# Patient Record
Sex: Female | Born: 1950 | Race: White | Hispanic: No | Marital: Married | Smoking: Never smoker
Health system: Southern US, Community
[De-identification: ages and names within clinical notes are randomized; demographics above are authoritative.]

## PROBLEM LIST (undated history)

## (undated) DIAGNOSIS — C801 Malignant (primary) neoplasm, unspecified: Secondary | ICD-10-CM

## (undated) DIAGNOSIS — I10 Essential (primary) hypertension: Secondary | ICD-10-CM

## (undated) DIAGNOSIS — Z923 Personal history of irradiation: Secondary | ICD-10-CM

## (undated) DIAGNOSIS — Z800 Family history of malignant neoplasm of digestive organs: Secondary | ICD-10-CM

## (undated) DIAGNOSIS — E119 Type 2 diabetes mellitus without complications: Secondary | ICD-10-CM

## (undated) DIAGNOSIS — Z8489 Family history of other specified conditions: Secondary | ICD-10-CM

## (undated) DIAGNOSIS — K219 Gastro-esophageal reflux disease without esophagitis: Secondary | ICD-10-CM

## (undated) DIAGNOSIS — Z801 Family history of malignant neoplasm of trachea, bronchus and lung: Secondary | ICD-10-CM

## (undated) DIAGNOSIS — R519 Headache, unspecified: Secondary | ICD-10-CM

## (undated) DIAGNOSIS — G4730 Sleep apnea, unspecified: Secondary | ICD-10-CM

## (undated) DIAGNOSIS — Z803 Family history of malignant neoplasm of breast: Secondary | ICD-10-CM

## (undated) DIAGNOSIS — Z8049 Family history of malignant neoplasm of other genital organs: Secondary | ICD-10-CM

## (undated) DIAGNOSIS — R7303 Prediabetes: Secondary | ICD-10-CM

## (undated) DIAGNOSIS — L570 Actinic keratosis: Secondary | ICD-10-CM

## (undated) DIAGNOSIS — C50919 Malignant neoplasm of unspecified site of unspecified female breast: Secondary | ICD-10-CM

## (undated) DIAGNOSIS — M1990 Unspecified osteoarthritis, unspecified site: Secondary | ICD-10-CM

## (undated) HISTORY — PX: BREAST SURGERY: SHX581

## (undated) HISTORY — DX: Family history of malignant neoplasm of other genital organs: Z80.49

## (undated) HISTORY — PX: BREAST LUMPECTOMY: SHX2

## (undated) HISTORY — DX: Family history of malignant neoplasm of digestive organs: Z80.0

## (undated) HISTORY — PX: CHOLECYSTECTOMY: SHX55

## (undated) HISTORY — DX: Family history of malignant neoplasm of breast: Z80.3

## (undated) HISTORY — PX: BREAST CYST EXCISION: SHX579

## (undated) HISTORY — DX: Family history of malignant neoplasm of trachea, bronchus and lung: Z80.1

## (undated) HISTORY — DX: Actinic keratosis: L57.0

## (undated) HISTORY — PX: APPENDECTOMY: SHX54

---

## 2004-05-29 ENCOUNTER — Ambulatory Visit: Payer: Self-pay | Admitting: Obstetrics and Gynecology

## 2004-12-18 ENCOUNTER — Ambulatory Visit: Payer: Self-pay | Admitting: Obstetrics and Gynecology

## 2005-06-04 ENCOUNTER — Ambulatory Visit: Payer: Self-pay | Admitting: Obstetrics and Gynecology

## 2005-08-06 ENCOUNTER — Ambulatory Visit: Payer: Self-pay | Admitting: Obstetrics and Gynecology

## 2005-08-11 ENCOUNTER — Ambulatory Visit: Payer: Self-pay | Admitting: Gastroenterology

## 2006-02-25 ENCOUNTER — Inpatient Hospital Stay: Payer: Self-pay | Admitting: Surgery

## 2006-02-25 ENCOUNTER — Ambulatory Visit: Payer: Self-pay | Admitting: Gastroenterology

## 2006-10-28 ENCOUNTER — Ambulatory Visit: Payer: Self-pay | Admitting: Obstetrics and Gynecology

## 2007-11-10 ENCOUNTER — Ambulatory Visit: Payer: Self-pay | Admitting: Obstetrics and Gynecology

## 2008-11-14 ENCOUNTER — Ambulatory Visit: Payer: Self-pay | Admitting: Obstetrics and Gynecology

## 2009-04-27 ENCOUNTER — Inpatient Hospital Stay: Payer: Self-pay | Admitting: Internal Medicine

## 2010-03-19 ENCOUNTER — Ambulatory Visit: Payer: Self-pay | Admitting: Family Medicine

## 2011-03-24 ENCOUNTER — Ambulatory Visit: Payer: Self-pay | Admitting: Family Medicine

## 2012-11-10 ENCOUNTER — Ambulatory Visit: Payer: Self-pay | Admitting: Family Medicine

## 2013-08-07 DIAGNOSIS — M1990 Unspecified osteoarthritis, unspecified site: Secondary | ICD-10-CM | POA: Insufficient documentation

## 2014-02-21 DIAGNOSIS — G4730 Sleep apnea, unspecified: Secondary | ICD-10-CM | POA: Insufficient documentation

## 2016-04-06 ENCOUNTER — Ambulatory Visit
Admission: EM | Admit: 2016-04-06 | Discharge: 2016-04-06 | Disposition: A | Discharge status: Home / Self Care | Payer: Medicare Other | Attending: Family Medicine | Admitting: Family Medicine

## 2016-04-06 ENCOUNTER — Encounter: Payer: Self-pay | Admitting: Emergency Medicine

## 2016-04-06 DIAGNOSIS — J069 Acute upper respiratory infection, unspecified: Secondary | ICD-10-CM

## 2016-04-06 DIAGNOSIS — R05 Cough: Secondary | ICD-10-CM | POA: Diagnosis not present

## 2016-04-06 DIAGNOSIS — R059 Cough, unspecified: Secondary | ICD-10-CM

## 2016-04-06 HISTORY — DX: Type 2 diabetes mellitus without complications: E11.9

## 2016-04-06 HISTORY — DX: Essential (primary) hypertension: I10

## 2016-04-06 MED ORDER — ALBUTEROL SULFATE HFA 108 (90 BASE) MCG/ACT IN AERS: 2.0000 | INHALATION_SPRAY | RESPIRATORY_TRACT | 0 refills | Status: DC | PRN

## 2016-04-06 MED ORDER — HYDROCOD POLST-CPM POLST ER 10-8 MG/5ML PO SUER: 5.0000 mL | Freq: Every evening | ORAL | 0 refills | Status: DC | PRN

## 2016-04-06 MED ORDER — PREDNISONE 20 MG PO TABS
40.0000 mg | ORAL_TABLET | Freq: Every day | ORAL | 0 refills | Status: DC
Start: 2016-04-06 — End: 2019-10-30

## 2016-04-06 NOTE — ED Triage Notes (Signed)
Patient c/o cough and chest congestion for 2-3 days.  Patient denies fevers.

## 2016-04-06 NOTE — Discharge Instructions (Signed)
Take medication as prescribed. Rest. Drink plenty of fluids.  ° °Follow up with your primary care physician this week as needed. Return to Urgent care for new or worsening concerns.  ° °

## 2016-04-06 NOTE — ED Provider Notes (Signed)
[facility] ____________________________________________  Time seen: Approximately 4:06 PM  I have reviewed the triage vital signs and the nursing notes.   HISTORY  Chief Complaint Cough   HPI Barbara [name] is a 66 y.o. female  presenting for 3 days of cough and chest congestion. Reports occasional runny nose. Denies nasal congestion or sinus pressure. Reports cough is primarily a dry hacking cough. Reports cough is worse at night, which disrupts her sleep as she uses a CPAP at night. Reports cough during the day is not too bad. Denies accompanying fevers, chills or body aches. Reports has continued to remain active and work. 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 Reports that she has had the flu before, and reports that she does not feel like she has the flu. Denies home sick contacts.   Denies chest pain, pain with deep breath, shortness of breath, abdominal pain, dysuria, extremity pain, extremity swelling or rash. Denies recent sickness. Denies recent antibiotic use. Denies cardiac issues. Denies renal insufficiency.  [facility]: PCP   Past Medical History:  Diagnosis Date  . Diabetes mellitus without complication ([place])   . Hypertension     There are no active problems to display for this patient.   Past Surgical History:  Procedure Laterality Date  . APPENDECTOMY    . BREAST SURGERY    . CHOLECYSTECTOMY       No current facility-administered medications for this encounter.   Current Outpatient Prescriptions:  .  amLODipine (NORVASC) 5 MG tablet, Take 5 mg by mouth daily., Disp: , Rfl:  .  aspirin 81 MG chewable tablet, Chew 81 mg by mouth daily., Disp: , Rfl:  .  lovastatin (MEVACOR) 20 MG tablet, Take 20 mg by mouth at bedtime., Disp: , Rfl:  .  pantoprazole (PROTONIX) 40 MG tablet, Take 40 mg by mouth daily., Disp: , Rfl:  .  albuterol (PROVENTIL HFA;VENTOLIN HFA) 108 (90 Base) MCG/ACT inhaler, Inhale 2  puffs into the lungs every 4 (four) hours as needed for wheezing., Disp: 1 Inhaler, Rfl: 0 .  chlorpheniramine-HYDROcodone (TUSSIONEX PENNKINETIC ER) 10-8 MG/5ML SUER, Take 5 mLs by mouth at bedtime as needed for cough. do not drive or operate machinery while taking as can cause drowsiness., Disp: 75 mL, Rfl: 0 .  predniSONE (DELTASONE) 20 MG tablet, Take 2 tablets (40 mg total) by mouth daily., Disp: 6 tablet, Rfl: 0  Allergies Patient has no known allergies.  History reviewed. No pertinent family history.  Social History Social History  Substance Use Topics  . Smoking status: Never Smoker  . Smokeless tobacco: Never Used  . Alcohol use No    Review of Systems Constitutional: No fever/chills.  Eyes: No visual changes. ENT: No sore throat. Cardiovascular: Denies chest pain. Respiratory: Denies shortness of breath. Gastrointestinal: No abdominal pain.  No nausea, no vomiting.  No diarrhea.  No constipation. Genitourinary: Negative for dysuria. Musculoskeletal: Negative for back pain. Skin: Negative for rash. Neurological: Negative for headaches, focal weakness or numbness.  10-point ROS otherwise negative.  ____________________________________________   PHYSICAL EXAM:  VITAL SIGNS: ED Triage Vitals  Enc Vitals Group     BP 04/06/16 1546 (!) 165/78     Pulse Rate 04/06/16 1546 85     Resp 04/06/16 1546 16     Temp 04/06/16 1546 97.9 F (36.6 C)     Temp Source 04/06/16 1546 Oral     SpO2 04/06/16 1546 99 %  Weight 04/06/16 1543 195 lb (88.5 kg)     Height 04/06/16 1543 5\' 5"  (1.651 m)     Head Circumference --      Peak Flow --      Pain Score 04/06/16 1546 0     Pain Loc --      Pain Edu? --      Excl. in [place]? --    Constitutional: Alert and oriented. Well appearing and in no acute distress. Eyes: Conjunctivae are normal. PERRL. EOMI. Head: Atraumatic. No sinus tenderness to palpation. No swelling. No erythema.  Ears: no erythema, normal TMs bilaterally.    Nose: No nasal congestion or rhinorrhea.   Mouth/Throat: Mucous membranes are moist. No pharyngeal erythema. No tonsillar swelling or exudate.  Neck: No stridor.  No cervical spine tenderness to palpation. Hematological/Lymphatic/Immunilogical: No cervical lymphadenopathy. Cardiovascular: Normal rate, regular rhythm. Grossly normal heart sounds.  Good peripheral circulation.  Respiratory: Normal respiratory effort.  No retractions. No wheezes, rales or rhonchi. Good air movement. Speaks in complete sentences. Dry intermittent cough in room with mild bronchospasm wheeze. Chest nontender to palpation.  Gastrointestinal: Soft and nontender.  Musculoskeletal: Ambulatory with steady gait. No cervical, thoracic or lumbar tenderness to palpation. Bilateral lower extremities nontender no edema noted. Bilateral pedal pulses equal and easily palpated. Neurologic:  Normal speech and language. No gait instability. Skin:  Skin appears warm, dry and intact. No rash noted. Psychiatric: Mood and affect are normal. Speech and behavior are normal.  ___________________________________________   LABS (all labs ordered are listed, but only abnormal results are displayed)  Labs Reviewed - No data to display  PROCEDURES Procedures   INITIAL IMPRESSION / ASSESSMENT AND PLAN / ED COURSE  Pertinent labs & imaging results that were available during my care of the patient were reviewed by me and considered in my medical decision making (see chart for details).   Well-appearing patient. No acute distress. Suspect viral upper respiratory infection. Will treat patient with Tussionex at night as needed, 3 day course prednisone and when necessary albuterol inhaler. Encourage rest, fluids and supportive care. Discussed indication, risks and benefits of medications with patient.   Discussed follow up with Primary care physician this week. Discussed follow up and return parameters including no resolution or any worsening  concerns. Patient verbalized understanding and agreed to plan.   ____________________________________________   FINAL CLINICAL IMPRESSION(S) / ED DIAGNOSES  Final diagnoses:  Cough  Upper respiratory tract infection, unspecified type     Discharge Medication List as of 04/06/2016  4:25 PM    START taking these medications   Details  albuterol (PROVENTIL HFA;VENTOLIN HFA) 108 (90 Base) MCG/ACT inhaler Inhale 2 puffs into the lungs every 4 (four) hours as needed for wheezing., Starting Mon 04/06/2016, Normal    chlorpheniramine-HYDROcodone (TUSSIONEX PENNKINETIC ER) 10-8 MG/5ML SUER Take 5 mLs by mouth at bedtime as needed for cough. do not drive or operate machinery while taking as can cause drowsiness., Starting Mon 04/06/2016, Print    predniSONE (DELTASONE) 20 MG tablet Take 2 tablets (40 mg total) by mouth daily., Starting Mon 04/06/2016, Normal        Note: This dictation was prepared with Dragon dictation along with smaller phrase technology. Any transcriptional errors that result from this process are unintentional.         Marylene Land, NP 04/06/16 1654

## 2016-04-09 ENCOUNTER — Telehealth: Payer: Self-pay

## 2016-04-09 NOTE — Telephone Encounter (Signed)
Courtesy call back completed today after patient's visit at [facility]. Patient improved and will call back with any questions or concerns.  

## 2017-08-30 DIAGNOSIS — E669 Obesity, unspecified: Secondary | ICD-10-CM | POA: Insufficient documentation

## 2017-09-07 ENCOUNTER — Other Ambulatory Visit: Payer: Self-pay | Admitting: Family Medicine

## 2017-09-07 DIAGNOSIS — Z1231 Encounter for screening mammogram for malignant neoplasm of breast: Secondary | ICD-10-CM

## 2017-10-11 ENCOUNTER — Ambulatory Visit
Admission: RE | Admit: 2017-10-11 | Discharge: 2017-10-11 | Disposition: A | Discharge status: Home / Self Care | Payer: Medicare Other | Source: Ambulatory Visit | Attending: Family Medicine | Admitting: Family Medicine

## 2017-10-11 DIAGNOSIS — Z1231 Encounter for screening mammogram for malignant neoplasm of breast: Secondary | ICD-10-CM | POA: Insufficient documentation

## 2018-09-21 ENCOUNTER — Other Ambulatory Visit: Payer: Self-pay | Admitting: Family Medicine

## 2018-09-21 DIAGNOSIS — Z1231 Encounter for screening mammogram for malignant neoplasm of breast: Secondary | ICD-10-CM

## 2018-09-27 ENCOUNTER — Other Ambulatory Visit: Payer: Self-pay | Admitting: Family Medicine

## 2018-09-27 DIAGNOSIS — R599 Enlarged lymph nodes, unspecified: Secondary | ICD-10-CM

## 2018-10-04 ENCOUNTER — Ambulatory Visit
Admission: RE | Admit: 2018-10-04 | Discharge: 2018-10-04 | Disposition: A | Discharge status: Home / Self Care | Payer: Medicare Other | Source: Ambulatory Visit | Attending: Family Medicine | Admitting: Family Medicine

## 2018-10-04 DIAGNOSIS — R599 Enlarged lymph nodes, unspecified: Secondary | ICD-10-CM | POA: Diagnosis not present

## 2018-10-07 ENCOUNTER — Other Ambulatory Visit: Payer: Self-pay | Admitting: Family Medicine

## 2018-10-07 DIAGNOSIS — N6320 Unspecified lump in the left breast, unspecified quadrant: Secondary | ICD-10-CM

## 2019-04-05 ENCOUNTER — Ambulatory Visit
Admission: RE | Admit: 2019-04-05 | Discharge: 2019-04-05 | Disposition: A | Discharge status: Home / Self Care | Payer: Medicare PPO | Source: Ambulatory Visit | Attending: Family Medicine | Admitting: Family Medicine

## 2019-04-05 DIAGNOSIS — N6320 Unspecified lump in the left breast, unspecified quadrant: Secondary | ICD-10-CM

## 2019-04-06 ENCOUNTER — Other Ambulatory Visit: Payer: Self-pay | Admitting: Family Medicine

## 2019-04-06 DIAGNOSIS — R2232 Localized swelling, mass and lump, left upper limb: Secondary | ICD-10-CM

## 2019-04-06 DIAGNOSIS — R928 Other abnormal and inconclusive findings on diagnostic imaging of breast: Secondary | ICD-10-CM

## 2019-06-16 ENCOUNTER — Other Ambulatory Visit: Payer: Self-pay | Admitting: Orthopedic Surgery

## 2019-06-16 DIAGNOSIS — M25361 Other instability, right knee: Secondary | ICD-10-CM

## 2019-06-16 DIAGNOSIS — M1711 Unilateral primary osteoarthritis, right knee: Secondary | ICD-10-CM

## 2019-06-16 DIAGNOSIS — M25561 Pain in right knee: Secondary | ICD-10-CM

## 2019-06-16 DIAGNOSIS — M2391 Unspecified internal derangement of right knee: Secondary | ICD-10-CM

## 2019-06-29 ENCOUNTER — Other Ambulatory Visit: Payer: Self-pay

## 2019-06-29 ENCOUNTER — Ambulatory Visit
Admission: RE | Admit: 2019-06-29 | Discharge: 2019-06-29 | Disposition: A | Discharge status: Home / Self Care | Payer: Medicare PPO | Source: Ambulatory Visit | Attending: Orthopedic Surgery | Admitting: Orthopedic Surgery

## 2019-06-29 DIAGNOSIS — M25561 Pain in right knee: Secondary | ICD-10-CM | POA: Diagnosis present

## 2019-06-29 DIAGNOSIS — G8929 Other chronic pain: Secondary | ICD-10-CM | POA: Diagnosis present

## 2019-06-29 DIAGNOSIS — M25361 Other instability, right knee: Secondary | ICD-10-CM | POA: Diagnosis present

## 2019-06-29 DIAGNOSIS — M2391 Unspecified internal derangement of right knee: Secondary | ICD-10-CM

## 2019-06-29 DIAGNOSIS — M1711 Unilateral primary osteoarthritis, right knee: Secondary | ICD-10-CM | POA: Diagnosis present

## 2019-10-13 ENCOUNTER — Ambulatory Visit
Admission: RE | Admit: 2019-10-13 | Discharge: 2019-10-13 | Disposition: A | Discharge status: Home / Self Care | Payer: Medicare PPO | Source: Ambulatory Visit | Attending: Family Medicine | Admitting: Family Medicine

## 2019-10-13 ENCOUNTER — Other Ambulatory Visit: Payer: Self-pay | Admitting: Family Medicine

## 2019-10-13 DIAGNOSIS — R2232 Localized swelling, mass and lump, left upper limb: Secondary | ICD-10-CM

## 2019-10-13 DIAGNOSIS — R928 Other abnormal and inconclusive findings on diagnostic imaging of breast: Secondary | ICD-10-CM

## 2019-10-19 ENCOUNTER — Other Ambulatory Visit: Payer: Self-pay | Admitting: Family Medicine

## 2019-10-19 DIAGNOSIS — N6310 Unspecified lump in the right breast, unspecified quadrant: Secondary | ICD-10-CM

## 2019-10-19 DIAGNOSIS — R928 Other abnormal and inconclusive findings on diagnostic imaging of breast: Secondary | ICD-10-CM

## 2019-10-20 ENCOUNTER — Other Ambulatory Visit: Payer: Self-pay

## 2019-10-20 ENCOUNTER — Ambulatory Visit
Admission: RE | Admit: 2019-10-20 | Discharge: 2019-10-20 | Disposition: A | Discharge status: Home / Self Care | Payer: Medicare PPO | Source: Ambulatory Visit | Attending: Family Medicine | Admitting: Family Medicine

## 2019-10-20 DIAGNOSIS — R928 Other abnormal and inconclusive findings on diagnostic imaging of breast: Secondary | ICD-10-CM

## 2019-10-20 DIAGNOSIS — N6310 Unspecified lump in the right breast, unspecified quadrant: Secondary | ICD-10-CM

## 2019-10-20 HISTORY — PX: BREAST BIOPSY: SHX20

## 2019-10-23 ENCOUNTER — Encounter: Payer: Self-pay | Admitting: *Deleted

## 2019-10-23 DIAGNOSIS — C50911 Malignant neoplasm of unspecified site of right female breast: Secondary | ICD-10-CM

## 2019-10-23 NOTE — Progress Notes (Signed)
Called patient to establish navigation services.  Patient is newly diagnosed with invasive mammary carcinoma.  I have scheduled her to see Dr. Peyton Najjar tomorrow at 1:30 for surgical consultation, and Dr. Tasia Catchings on 10/26/19 at 1:30 for medical oncology consultation.  Will give educational material at that time.

## 2019-10-24 ENCOUNTER — Other Ambulatory Visit: Payer: Self-pay | Admitting: General Surgery

## 2019-10-24 DIAGNOSIS — C50919 Malignant neoplasm of unspecified site of unspecified female breast: Secondary | ICD-10-CM

## 2019-10-25 ENCOUNTER — Other Ambulatory Visit: Payer: Self-pay | Admitting: General Surgery

## 2019-10-25 ENCOUNTER — Ambulatory Visit: Payer: Self-pay | Admitting: General Surgery

## 2019-10-25 DIAGNOSIS — C50919 Malignant neoplasm of unspecified site of unspecified female breast: Secondary | ICD-10-CM

## 2019-10-25 NOTE — H&P (Signed)
PATIENT PROFILE: Barbara [name] is a 69 y.o. female who presents to the Clinic for consultation at the request of Dr. Ellison Hughs for evaluation of breast cancer.  PCP:  Loa Socks., MD  HISTORY OF PRESENT ILLNESS: Barbara [name] reports having a diagnostic mammogram due to follow-up of a cyst of the left axillary area.  She was found with an area of distortion of the right breast.  Stereotactic core biopsy was done.  This shows invasive ductal carcinoma.  The mammographic and ultrasound findings shows area of distortion of 5 mm.  The invasive component measured 2 mm on the core biopsy.  I personally evaluated the images.  Patient denies any skin changes, palpable masses, nipple retraction, nipple lesions on either breast.  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   Elevated fasting blood sugar Unknown   Hypertension, essential, benign Unknown   Hyperlipidemia Unknown   GERD (gastroesophageal reflux disease) Unknown      GENERAL REVIEW OF SYSTEMS:   General ROS: negative for - chills, fatigue, fever, weight gain or weight loss Allergy and Immunology ROS: negative for - hives  Hematological and Lymphatic ROS: negative for - bleeding problems or bruising, negative for palpable nodes Endocrine ROS: negative for - heat or cold intolerance, hair changes Respiratory ROS: negative for - cough, shortness of breath or wheezing Cardiovascular ROS: no  chest pain or palpitations GI ROS: negative for nausea, vomiting, abdominal pain, diarrhea, constipation Musculoskeletal ROS: negative for - joint swelling or muscle pain Neurological ROS: negative for - confusion, syncope Dermatological ROS: negative for pruritus and rash Psychiatric: negative for anxiety, depression, difficulty sleeping and memory loss  MEDICATIONS: Current Medications        Current Outpatient Medications  Medication Sig Dispense Refill  . amLODIPine (NORVASC) 5 MG tablet Take 1 tablet by mouth once daily 90 tablet 1  . aspirin 81 MG EC tablet Take 81 mg by mouth once daily.    Marland Kitchen azelastine (ASTELIN) 137 mcg nasal spray Place 1 spray into both nostrils 2 (two) times daily 10 mL 1  . cetirizine (ZYRTEC) 10 MG tablet Take 10 mg by mouth once daily    . cholecalciferol (VITAMIN D3) 2,000 unit tablet Take 2,000 Units by mouth once daily.    . cinnamon bark (CINNAMON ORAL) Take 2,000 mg by mouth.    . diphenhydrAMINE (BENADRYL) 25 mg capsule Take 25 mg by mouth every 6 (six) hours as needed for Itching.    . lovastatin (MEVACOR) 40 MG tablet Take 1 tablet by mouth once daily 90 tablet 1  . meloxicam (MOBIC) 15 MG tablet Take 1 tablet (15 mg total) by mouth once daily 30 tablet 0  . mometasone (ELOCON) 0.1 % lotion Apply topically once daily    . omega-3 fatty acids (FISH OIL) 500 mg Cap capsule Take by mouth.    . pantoprazole (PROTONIX) 40 MG DR tablet Take 1 tablet by  mouth once daily 90 tablet 3   No current facility-administered medications for this visit.      ALLERGIES: Patient has no known allergies.  PAST MEDICAL HISTORY:     Past Medical History:  Diagnosis Date  . Diverticulosis   . Elevated fasting blood sugar   . GERD (gastroesophageal reflux disease)   . Hyperlipidemia   . Hypertension   . Osteoporosis   . Sleep apnea     PAST SURGICAL HISTORY:      Past Surgical History:  Procedure Laterality Date  .  APPENDECTOMY    . Benign breast biopsy    . CHOLECYSTECTOMY    . COLONOSCOPY  02/15/2003   Dr. Oval Linsey @ [facility] - Diverticulosis, [facility](m)  . COLONOSCOPY  12/09/2017   [facility] (Mother) [facility] 12/2022  . EGD  08/11/2005   Dr. Oval Linsey @ [facility] - Schatzki's ring     FAMILY HISTORY:      Family History  Problem Relation Age of Onset  . Colon cancer Mother   . Pancreatic cancer Father      SOCIAL HISTORY: Social History          Socioeconomic History  . Marital status: Married    Spouse name: Not on file  . Number of children: Not on file  . Years of education: Not on file  . Highest education level: Not on file  Occupational History  . Not on file  Tobacco Use  . Smoking status: Never Smoker  . Smokeless tobacco: Never Used  Substance and Sexual Activity  . Alcohol use: No  . Drug use: No  . Sexual activity: Defer  Other Topics Concern  . Not on file  Social History Narrative  . Not on file   Social Determinants of Health      Financial Resource Strain:   . Difficulty of Paying Living Expenses:   Food Insecurity:   . Worried About Programme researcher, broadcasting/film/video in the Last Year:   . Barista in the Last Year:   Transportation Needs:   . Freight forwarder (Medical):   Marland Kitchen Lack of Transportation (Non-Medical):       PHYSICAL EXAM:    Vitals:   10/24/19 1344  BP: 141/89  Pulse: 99   Body mass index is 39.06 kg/m. Weight: 90.7 kg (200 lb)   GENERAL: Alert, active, oriented x3  HEENT: Pupils equal reactive to light. Extraocular movements are intact. Sclera clear. Palpebral conjunctiva normal red color.Pharynx clear.  NECK: Supple with no palpable mass and no adenopathy.  LUNGS: Sound clear with no rales rhonchi or wheezes.  HEART: Regular rhythm S1 and S2 without murmur.  BREAST: breasts appear normal, no suspicious masses, no skin or nipple changes or axillary nodes.  ABDOMEN: Soft and depressible, nontender with no  palpable mass, no hepatomegaly.  EXTREMITIES: Well-developed well-nourished symmetrical with no dependent edema.  NEUROLOGICAL: Awake alert oriented, facial expression symmetrical, moving all extremities.  REVIEW OF DATA: I have reviewed the following data today:      No visits with results within 3 Month(s) from this visit.  Latest known visit with results is:  Office Visit on 03/28/2019  Component Date Value  . Glucose 03/28/2019 119*  . Sodium 03/28/2019 141   . Potassium 03/28/2019 4.0   . Chloride 03/28/2019 106   . Carbon Dioxide (CO2) 03/28/2019 29.9   . Urea Nitrogen (BUN) 03/28/2019 14   . Creatinine 03/28/2019 0.8   . Glomerular Filtration Ra* 03/28/2019  71   . Calcium 03/28/2019 9.5   . AST  03/28/2019 14   . ALT  03/28/2019 19   . Alk Phos (alkaline Phosp* 03/28/2019 96   . Albumin 03/28/2019 4.2   . Bilirubin, Total 03/28/2019 1.1   . Protein, Total 03/28/2019 6.6   . A/G Ratio 03/28/2019 1.8   . Cholesterol, Total 03/28/2019 192   . Triglyceride 03/28/2019 88   . HDL (High Density Lipopr* 03/28/2019 72.1   . LDL Calculated 03/28/2019 102   . VLDL Cholesterol 03/28/2019 18   . Cholesterol/HDL Ratio 03/28/2019 2.7   . Hemoglobin A1C 03/28/2019 6.2*  . Average Blood Glucose (C* 03/28/2019 131      ASSESSMENT: Ms. Mulka is a 69 y.o. female presenting for consultation for right breast cancer.    Patient was oriented again about the pathology results. Surgical alternatives were discussed with patient including partial vs total mastectomy. Surgical technique and post operative care was discussed with patient. Risk of surgery was discussed with patient including but not limited to: wound infection, seroma, hematoma, brachial plexopathy, mondor's disease (thrombosis of small veins of breast), chronic wound pain, breast lymphedema, altered sensation to the nipple and cosmesis among others.   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 Right breast radiofrequency guided partial mastectomy with sentinel lymph node biopsy (19301, 38525) 2. CBC, CMP 3. Hold aspirin 5 days before surgery 4. Contact us if you have any question or concern.  Patient verbalized understanding, all questions were answered, and were agreeable with the plan outlined above.   Herbert Pun, MD  Electronically signed by Herbert Pun, MD

## 2019-10-25 NOTE — H&P (View-Only) (Signed)
PATIENT PROFILE: Barbara [name] is a 69 y.o. female who presents to the Clinic for consultation at the request of Dr. Ellison Hughs for evaluation of breast cancer.  PCP:  Loa Socks., MD  HISTORY OF PRESENT ILLNESS: Barbara [name] reports having a diagnostic mammogram due to follow-up of a cyst of the left axillary area.  She was found with an area of distortion of the right breast.  Stereotactic core biopsy was done.  This shows invasive ductal carcinoma.  The mammographic and ultrasound findings shows area of distortion of 5 mm.  The invasive component measured 2 mm on the core biopsy.  I personally evaluated the images.  Patient denies any skin changes, palpable masses, nipple retraction, nipple lesions on either breast.  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   Elevated fasting blood sugar Unknown   Hypertension, essential, benign Unknown   Hyperlipidemia Unknown   GERD (gastroesophageal reflux disease) Unknown      GENERAL REVIEW OF SYSTEMS:   General ROS: negative for - chills, fatigue, fever, weight gain or weight loss Allergy and Immunology ROS: negative for - hives  Hematological and Lymphatic ROS: negative for - bleeding problems or bruising, negative for palpable nodes Endocrine ROS: negative for - heat or cold intolerance, hair changes Respiratory ROS: negative for - cough, shortness of breath or wheezing Cardiovascular ROS: no  chest pain or palpitations GI ROS: negative for nausea, vomiting, abdominal pain, diarrhea, constipation Musculoskeletal ROS: negative for - joint swelling or muscle pain Neurological ROS: negative for - confusion, syncope Dermatological ROS: negative for pruritus and rash Psychiatric: negative for anxiety, depression, difficulty sleeping and memory loss  MEDICATIONS: Current Medications        Current Outpatient Medications  Medication Sig Dispense Refill  . amLODIPine (NORVASC) 5 MG tablet Take 1 tablet by mouth once daily 90 tablet 1  . aspirin 81 MG EC tablet Take 81 mg by mouth once daily.    Marland Kitchen azelastine (ASTELIN) 137 mcg nasal spray Place 1 spray into both nostrils 2 (two) times daily 10 mL 1  . cetirizine (ZYRTEC) 10 MG tablet Take 10 mg by mouth once daily    . cholecalciferol (VITAMIN D3) 2,000 unit tablet Take 2,000 Units by mouth once daily.    . cinnamon bark (CINNAMON ORAL) Take 2,000 mg by mouth.    . diphenhydrAMINE (BENADRYL) 25 mg capsule Take 25 mg by mouth every 6 (six) hours as needed for Itching.    . lovastatin (MEVACOR) 40 MG tablet Take 1 tablet by mouth once daily 90 tablet 1  . meloxicam (MOBIC) 15 MG tablet Take 1 tablet (15 mg total) by mouth once daily 30 tablet 0  . mometasone (ELOCON) 0.1 % lotion Apply topically once daily    . omega-3 fatty acids (FISH OIL) 500 mg Cap capsule Take by mouth.    . pantoprazole (PROTONIX) 40 MG DR tablet Take 1 tablet by  mouth once daily 90 tablet 3   No current facility-administered medications for this visit.      ALLERGIES: Patient has no known allergies.  PAST MEDICAL HISTORY:     Past Medical History:  Diagnosis Date  . Diverticulosis   . Elevated fasting blood sugar   . GERD (gastroesophageal reflux disease)   . Hyperlipidemia   . Hypertension   . Osteoporosis   . Sleep apnea     PAST SURGICAL HISTORY:      Past Surgical History:  Procedure Laterality Date  .  APPENDECTOMY    . Benign breast biopsy    . CHOLECYSTECTOMY    . COLONOSCOPY  02/15/2003   Dr. Ivor Messier @ [facility] - Diverticulosis, [facility](m)  . COLONOSCOPY  12/09/2017   [facility] (Mother) CBF 12/2022  . EGD  08/11/2005   Dr. Ivor Messier @ [facility] - Schatzki's ring     FAMILY HISTORY:      Family History  Problem Relation Age of Onset  . Colon cancer Mother   . Pancreatic cancer Father      SOCIAL HISTORY: Social History          Socioeconomic History  . Marital status: Married    Spouse name: Not on file  . Number of children: Not on file  . Years of education: Not on file  . Highest education level: Not on file  Occupational History  . Not on file  Tobacco Use  . Smoking status: Never Smoker  . Smokeless tobacco: Never Used  Substance and Sexual Activity  . Alcohol use: No  . Drug use: No  . Sexual activity: Defer  Other Topics Concern  . Not on file  Social History Narrative  . Not on file   Social Determinants of Health      Financial Resource Strain:   . Difficulty of Paying Living Expenses:   Food Insecurity:   . Worried About Charity fundraiser in the Last Year:   . Arboriculturist in the Last Year:   Transportation Needs:   . Film/video editor (Medical):   Marland Kitchen Lack of Transportation (Non-Medical):       PHYSICAL EXAM:    Vitals:   10/24/19 1344  BP: 141/89  Pulse: 99   Body mass index is 39.06 kg/m. Weight: 90.7 kg (200 lb)   GENERAL: Alert, active, oriented x3  HEENT: Pupils equal reactive to light. Extraocular movements are intact. Sclera clear. Palpebral conjunctiva normal red color.Pharynx clear.  NECK: Supple with no palpable mass and no adenopathy.  LUNGS: Sound clear with no rales rhonchi or wheezes.  HEART: Regular rhythm S1 and S2 without murmur.  BREAST: breasts appear normal, no suspicious masses, no skin or nipple changes or axillary nodes.  ABDOMEN: Soft and depressible, nontender with no  palpable mass, no hepatomegaly.  EXTREMITIES: Well-developed well-nourished symmetrical with no dependent edema.  NEUROLOGICAL: Awake alert oriented, facial expression symmetrical, moving all extremities.  REVIEW OF DATA: I have reviewed the following data today:      No visits with results within 3 Month(s) from this visit.  Latest known visit with results is:  Office Visit on 03/28/2019  Component Date Value  . Glucose 03/28/2019 119*  . Sodium 03/28/2019 141   . Potassium 03/28/2019 4.0   . Chloride 03/28/2019 106   . Carbon Dioxide (CO2) 03/28/2019 29.9   . Urea Nitrogen (BUN) 03/28/2019 14   . Creatinine 03/28/2019 0.8   . Glomerular Filtration Ra* 03/28/2019  71   . Calcium 03/28/2019 9.5   . AST  03/28/2019 14   . ALT  03/28/2019 19   . Alk Phos (alkaline Phosp* 03/28/2019 96   . Albumin 03/28/2019 4.2   . Bilirubin, Total 03/28/2019 1.1   . Protein, Total 03/28/2019 6.6   . A/G Ratio 03/28/2019 1.8   . Cholesterol, Total 03/28/2019 192   . Triglyceride 03/28/2019 88   . HDL (High Density Lipopr* 03/28/2019 72.1   . LDL Calculated 03/28/2019 102   . VLDL Cholesterol 03/28/2019 18   . Cholesterol/HDL Ratio 03/28/2019 2.7   . Hemoglobin A1C 03/28/2019 6.2*  . Average Blood Glucose (C* 03/28/2019 131      ASSESSMENT: Ms. Mulka is a 69 y.o. female presenting for consultation for right breast cancer.    Patient was oriented again about the pathology results. Surgical alternatives were discussed with patient including partial vs total mastectomy. Surgical technique and post operative care was discussed with patient. Risk of surgery was discussed with patient including but not limited to: wound infection, seroma, hematoma, brachial plexopathy, mondor's disease (thrombosis of small veins of breast), chronic wound pain, breast lymphedema, altered sensation to the nipple and cosmesis among others.   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 Right breast radiofrequency guided partial mastectomy with sentinel lymph node biopsy (19301, [id number]) 2. CBC, CMP 3. Hold aspirin 5 days before surgery 4. Contact us if you have any question or concern.  Patient verbalized understanding, all questions were answered, and were agreeable with the plan outlined above.   Herbert Pun, MD  Electronically signed by Herbert Pun, MD

## 2019-10-26 ENCOUNTER — Encounter
Admission: RE | Admit: 2019-10-26 | Discharge: 2019-10-26 | Disposition: A | Discharge status: Home / Self Care | Payer: Medicare PPO | Source: Ambulatory Visit | Attending: General Surgery | Admitting: General Surgery

## 2019-10-26 ENCOUNTER — Inpatient Hospital Stay: Payer: Medicare PPO

## 2019-10-26 ENCOUNTER — Other Ambulatory Visit: Payer: Self-pay

## 2019-10-26 ENCOUNTER — Encounter: Payer: Self-pay | Admitting: Oncology

## 2019-10-26 ENCOUNTER — Ambulatory Visit
Admission: RE | Admit: 2019-10-26 | Discharge: 2019-10-26 | Disposition: A | Discharge status: Home / Self Care | Payer: Medicare PPO | Source: Ambulatory Visit | Attending: General Surgery | Admitting: General Surgery

## 2019-10-26 ENCOUNTER — Encounter: Payer: Self-pay | Admitting: *Deleted

## 2019-10-26 ENCOUNTER — Inpatient Hospital Stay: Payer: Medicare PPO | Attending: Oncology | Admitting: Oncology

## 2019-10-26 VITALS — BP 116/74 | HR 85 | Temp 97.8°F | Resp 18 | Ht 65.0 in | Wt 204.0 lb

## 2019-10-26 DIAGNOSIS — Z801 Family history of malignant neoplasm of trachea, bronchus and lung: Secondary | ICD-10-CM | POA: Insufficient documentation

## 2019-10-26 DIAGNOSIS — Z79899 Other long term (current) drug therapy: Secondary | ICD-10-CM | POA: Diagnosis not present

## 2019-10-26 DIAGNOSIS — Z800 Family history of malignant neoplasm of digestive organs: Secondary | ICD-10-CM

## 2019-10-26 DIAGNOSIS — Z803 Family history of malignant neoplasm of breast: Secondary | ICD-10-CM | POA: Diagnosis not present

## 2019-10-26 DIAGNOSIS — Z170 Estrogen receptor positive status [ER+]: Secondary | ICD-10-CM | POA: Insufficient documentation

## 2019-10-26 DIAGNOSIS — N6489 Other specified disorders of breast: Secondary | ICD-10-CM | POA: Insufficient documentation

## 2019-10-26 DIAGNOSIS — Z780 Asymptomatic menopausal state: Secondary | ICD-10-CM | POA: Diagnosis not present

## 2019-10-26 DIAGNOSIS — C50919 Malignant neoplasm of unspecified site of unspecified female breast: Secondary | ICD-10-CM | POA: Insufficient documentation

## 2019-10-26 DIAGNOSIS — R590 Localized enlarged lymph nodes: Secondary | ICD-10-CM | POA: Diagnosis not present

## 2019-10-26 DIAGNOSIS — Z9049 Acquired absence of other specified parts of digestive tract: Secondary | ICD-10-CM | POA: Diagnosis not present

## 2019-10-26 DIAGNOSIS — I2510 Atherosclerotic heart disease of native coronary artery without angina pectoris: Secondary | ICD-10-CM | POA: Insufficient documentation

## 2019-10-26 DIAGNOSIS — C50211 Malignant neoplasm of upper-inner quadrant of right female breast: Secondary | ICD-10-CM | POA: Diagnosis not present

## 2019-10-26 DIAGNOSIS — Z809 Family history of malignant neoplasm, unspecified: Secondary | ICD-10-CM

## 2019-10-26 DIAGNOSIS — C50911 Malignant neoplasm of unspecified site of right female breast: Secondary | ICD-10-CM

## 2019-10-26 HISTORY — DX: Sleep apnea, unspecified: G47.30

## 2019-10-26 HISTORY — DX: Prediabetes: R73.03

## 2019-10-26 HISTORY — DX: Malignant (primary) neoplasm, unspecified: C80.1

## 2019-10-26 HISTORY — DX: Gastro-esophageal reflux disease without esophagitis: K21.9

## 2019-10-26 HISTORY — DX: Headache, unspecified: R51.9

## 2019-10-26 LAB — CBC WITH DIFFERENTIAL/PLATELET
Abs Immature Granulocytes: 0.03 10*3/uL (ref 0.00–0.07)
Basophils Absolute: 0 10*3/uL (ref 0.0–0.1)
Basophils Relative: 1 %
Eosinophils Absolute: 0.1 10*3/uL (ref 0.0–0.5)
Eosinophils Relative: 1 %
HCT: 40.9 % (ref 36.0–46.0)
Hemoglobin: 14 g/dL (ref 12.0–15.0)
Immature Granulocytes: 0 %
Lymphocytes Relative: 18 %
Lymphs Abs: 1.5 10*3/uL (ref 0.7–4.0)
MCH: 27.7 pg (ref 26.0–34.0)
MCHC: 34.2 g/dL (ref 30.0–36.0)
MCV: 80.8 fL (ref 80.0–100.0)
Monocytes Absolute: 0.5 10*3/uL (ref 0.1–1.0)
Monocytes Relative: 6 %
Neutro Abs: 6.2 10*3/uL (ref 1.7–7.7)
Neutrophils Relative %: 74 %
Platelets: 200 10*3/uL (ref 150–400)
RBC: 5.06 MIL/uL (ref 3.87–5.11)
RDW: 14.1 % (ref 11.5–15.5)
WBC: 8.3 10*3/uL (ref 4.0–10.5)
nRBC: 0 % (ref 0.0–0.2)

## 2019-10-26 LAB — COMPREHENSIVE METABOLIC PANEL
ALT: 17 U/L (ref 0–44)
AST: 15 U/L (ref 15–41)
Albumin: 4.1 g/dL (ref 3.5–5.0)
Alkaline Phosphatase: 84 U/L (ref 38–126)
Anion gap: 9 (ref 5–15)
BUN: 15 mg/dL (ref 8–23)
CO2: 27 mmol/L (ref 22–32)
Calcium: 9 mg/dL (ref 8.9–10.3)
Chloride: 105 mmol/L (ref 98–111)
Creatinine, Ser: 1.08 mg/dL — ABNORMAL HIGH (ref 0.44–1.00)
GFR calc Af Amer: >60 mL/min (ref >60)
GFR calc non Af Amer: 52 mL/min — ABNORMAL LOW (ref >60)
Glucose, Bld: 125 mg/dL — ABNORMAL HIGH (ref 70–99)
Potassium: 3.6 mmol/L (ref 3.5–5.1)
Sodium: 141 mmol/L (ref 135–145)
Total Bilirubin: 1.7 mg/dL — ABNORMAL HIGH (ref 0.3–1.2)
Total Protein: 7.1 g/dL (ref 6.5–8.1)

## 2019-10-26 LAB — SURGICAL PATHOLOGY

## 2019-10-26 NOTE — Progress Notes (Signed)
Hematology/Oncology Consult note [facility] Telephone:([phone] Fax:([phone]   Patient Care Team: [name], MD as PCP - General (Family Medicine)  REFERRING PROVIDER: Lovey Newcomer, MD  CHIEF COMPLAINTS/REASON FOR VISIT:  Evaluation of breast cancer  HISTORY OF PRESENTING ILLNESS:   Barbara [name] is a  69 y.o.  female with PMH listed below was seen in consultation at the request of  Lovey Newcomer, MD  for evaluation of breast cancer  10/13/2019 diagnostic mammogram  Showed 74mm spiculated mass in the right breast 1:00. Unchanged left axillary probabaly benign mass- 1 year stability.  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 ER 90% positive, PR 1-0% positive, HER2 negative.  Patient denies any breast skin changes or nipple discharge  Family history of breast cancer: Ethlyn Daniels, and Fraser Din first cousin Family history of other cancers: lung cancer in mother, pancreatic cancer in father, unknown cancer in pat uncle.   Menarche: 72 or 9 Menopause: age of 60 Number of pregnancies : 2 Age at first live childbirth:2 Used OCP: remote use of OCP for couple of years Used estrogen and progesterone therapy: denies History of Radiation to the chest: denies Previous of breast biopsy: previous biopsies in 1970s    Review of Systems  Constitutional: Negative for appetite change, chills, fatigue and fever.  HENT:   Negative for hearing loss and voice change.   Eyes: Negative for eye problems.  Respiratory: Negative for chest tightness and cough.   Cardiovascular: Negative for chest pain.  Gastrointestinal: Negative for abdominal distention, abdominal pain and blood in stool.  Endocrine: Negative for hot flashes.  Genitourinary: Negative for difficulty urinating and frequency.   Musculoskeletal:  Negative for arthralgias.  Skin: Negative for itching and rash.  Neurological: Negative for extremity weakness.  Hematological: Negative for adenopathy.  Psychiatric/Behavioral: Negative for confusion.    MEDICAL HISTORY:  Past Medical History:  Diagnosis Date  . Cancer ([place])   . GERD (gastroesophageal reflux disease)   . Headache    migraines  . Hypertension   . Pre-diabetes   . Sleep apnea    uses cpap    SURGICAL HISTORY: Past Surgical History:  Procedure Laterality Date  . APPENDECTOMY    . BREAST BIOPSY Right 10/20/2019   Affirm bx-"X" clip path pending  . BREAST CYST EXCISION Right   . BREAST CYST EXCISION Right   . BREAST CYST EXCISION Left   . BREAST SURGERY    . CHOLECYSTECTOMY      SOCIAL HISTORY: Social History   Socioeconomic History  . Marital status: Married    Spouse name: Not on file  . Number of children: Not on file  . Years of education: Not on file  . Highest education level: Not on file  Occupational History  . Not on file  Tobacco Use  . Smoking status: Never Smoker  . Smokeless tobacco: Never Used  Vaping Use  . Vaping Use: Never used  Substance and Sexual Activity  . Alcohol use: No  . Drug use: No  . Sexual activity: Not on file  Other Topics Concern  . Not on file  Social History Narrative  . Not on file   Social Determinants of Health   Financial Resource Strain:   . Difficulty of Paying Living Expenses: Not on file  Food Insecurity:   . Worried About Estate manager/land agent  of Food in the Last Year: Not on file  . Ran Out of Food in the Last Year: Not on file  Transportation Needs:   . Lack of Transportation (Medical): Not on file  . Lack of Transportation (Non-Medical): Not on file  Physical Activity:   . Days of Exercise per Week: Not on file  . Minutes of Exercise per Session: Not on file  Stress:   . Feeling of Stress : Not on file  Social Connections:   . Frequency of Communication with Friends and Family: Not on file  .  Frequency of Social Gatherings with Friends and Family: Not on file  . Attends Religious Services: Not on file  . Active Member of Clubs or Organizations: Not on file  . Attends Archivist Meetings: Not on file  . Marital Status: Not on file  Intimate Partner Violence:   . Fear of Current or Ex-Partner: Not on file  . Emotionally Abused: Not on file  . Physically Abused: Not on file  . Sexually Abused: Not on file    FAMILY HISTORY: Family History  Problem Relation Age of Onset  . Breast cancer Paternal Aunt   . Lung cancer Mother   . Pancreatic cancer Father     ALLERGIES:  has No Known Allergies.  MEDICATIONS:  Current Outpatient Medications  Medication Sig Dispense Refill  . albuterol (PROVENTIL HFA;VENTOLIN HFA) 108 (90 Base) MCG/ACT inhaler Inhale 2 puffs into the lungs every 4 (four) hours as needed for wheezing. 1 Inhaler 0  . amLODipine (NORVASC) 5 MG tablet Take 5 mg by mouth at bedtime.     Marland Kitchen aspirin 81 MG EC tablet Take 81 mg by mouth daily.     . cetirizine (ZYRTEC) 10 MG tablet Take 10 mg by mouth every morning.     . Cholecalciferol (VITAMIN D) 50 MCG (2000 UT) CAPS Take 2,000 Units by mouth daily.    Marland Kitchen CINNAMON PO Take 1,000 mg by mouth in the morning and at bedtime.     Javier Docker Oil 500 MG CAPS Take 500 mg by mouth daily.    Marland Kitchen lovastatin (MEVACOR) 40 MG tablet Take 40 mg by mouth at bedtime.     . meloxicam (MOBIC) 15 MG tablet Take 15 mg by mouth daily as needed for pain.    . mometasone (ELOCON) 0.1 % cream Apply 1 application topically daily as needed (ear irritation).    . pantoprazole (PROTONIX) 40 MG tablet Take 40 mg by mouth every morning.     . chlorpheniramine-HYDROcodone (TUSSIONEX PENNKINETIC ER) 10-8 MG/5ML SUER Take 5 mLs by mouth at bedtime as needed for cough. do not drive or operate machinery while taking as can cause drowsiness. (Patient not taking: Reported on 10/25/2019) 75 mL 0  . predniSONE (DELTASONE) 20 MG tablet Take 2 tablets (40  mg total) by mouth daily. (Patient not taking: Reported on 10/25/2019) 6 tablet 0   No current facility-administered medications for this visit.     PHYSICAL EXAMINATION: ECOG PERFORMANCE STATUS: 0 - Asymptomatic Vitals:   10/26/19 1337  BP: 116/74  Pulse: 85  Resp: 18  Temp: 97.8 F (36.6 C)   Filed Weights   10/26/19 1337  Weight: 204 lb (92.5 kg)    Physical Exam Constitutional:      General: She is not in acute distress. HENT:     Head: Normocephalic and atraumatic.  Eyes:     General: No scleral icterus. Cardiovascular:     Rate and  Rhythm: Normal rate and regular rhythm.     Heart sounds: Normal heart sounds.  Pulmonary:     Effort: Pulmonary effort is normal. No respiratory distress.     Breath sounds: No wheezing.  Abdominal:     General: Bowel sounds are normal. There is no distension.     Palpations: Abdomen is soft.  Musculoskeletal:        General: No deformity. Normal range of motion.     Cervical back: Normal range of motion and neck supple.  Skin:    General: Skin is warm and dry.     Findings: No erythema or rash.  Neurological:     Mental Status: She is alert and oriented to person, place, and time. Mental status is at baseline.     Cranial Nerves: No cranial nerve deficit.     Coordination: Coordination normal.  Psychiatric:        Mood and Affect: Mood normal.   Breast exam was performed in seated and lying down position. Patient is status post right breast mass biopsy, focal bruising near the biopsy site. Small area of tissue swelling palpated at the site of lumpectomy.   LABORATORY DATA:  I have reviewed the data as listed Lab Results  Component Value Date   WBC 8.3 10/26/2019   HGB 14.0 10/26/2019   HCT 40.9 10/26/2019   MCV 80.8 10/26/2019   PLT 200 10/26/2019   Recent Labs    10/26/19 1425  NA 141  K 3.6  CL 105  CO2 27  GLUCOSE 125*  BUN 15  CREATININE 1.08*  CALCIUM 9.0  GFRNONAA 52*  GFRAA >60  PROT 7.1  ALBUMIN  4.1  AST 15  ALT 17  ALKPHOS 84  BILITOT 1.7*   Iron/TIBC/Ferritin/ %Sat No results found for: IRON, TIBC, FERRITIN, IRONPCTSAT    RADIOGRAPHIC STUDIES: I have personally reviewed the radiological images as listed and agreed with the findings in the report. US BREAST LTD UNI LEFT INC AXILLA  Result Date: 10/13/2019 CLINICAL DATA:  One year follow-up for LEFT axillary mass, BI-RADS 3. EXAM: DIGITAL DIAGNOSTIC BILATERAL MAMMOGRAM WITH TOMO AND CAD; ULTRASOUND RIGHT BREAST LIMITED; ULTRASOUND LEFT BREAST LIMITED COMPARISON:  Previous exam(s). ACR Breast Density Category c: The breast tissue is heterogeneously dense, which may obscure small masses. FINDINGS: In the inner RIGHT breast on CC view, there is a focal asymmetry possible associated architectural distortion. Spot compression views confirm persistence of architectural distortion with an associated spiculated mass. It measures approximately 5 mm and is best seen on CC spot image 39. It is in the breast at 1 o'clock middle depth. There is a likely correlate on ML slice 50. It is approximately 1.5 cm posterior to 2 adjacent oval circumscribed masses mammographically. There are multiple round and oval circumscribed masses bilaterally. The site of palpable concern in the LEFT axilla, there is no suspicious mammographic finding. Mammographic images were processed with CAD. Targeted ultrasound was performed of the LEFT axilla. At the site of palpable concern there is revisualization of a superficial mass located within the dermis which measures 1.0 by 0.9 x 0.5 cm, unchanged since 2020. Targeted ultrasound was performed of the RIGHT upper inner breast. There is a subtle irregular hypoechoic mass with indistinct margins at 1 o'clock 4 cm from the nipple. It is located 1.4 cm superior to 2 adjacent well-circumscribed anechoic masses with posterior acoustic enhancement consistent with cysts. It measures 3 x 3 x 4 mm. This is likely the sonographic correlate  to the mammographic finding. Multiple additional scattered cysts are noted. The breast tissue is heterogeneous in echotexture. No suspicious RIGHT axillary lymph nodes are identified. IMPRESSION: 1. There is a 5 mm spiculated mass with associated architectural distortion in the RIGHT breast at 1 o'clock. Given underlying sonographic breast heterogeneity and increased conspicuity of the mass on CC mammogram, recommend stereotactic guided biopsy of this mass. 2. No suspicious RIGHT axillary lymph nodes. 3. Unchanged LEFT axillary probably benign mass, likely an epidermal inclusion cyst. This has demonstrated 1 year of sonographic stability. No mammographic evidence of malignancy in the LEFT breast. RECOMMENDATION: 1.  Stereotactic guided biopsy of the RIGHT breast x1. 2. If malignant biopsy results, recommend dedicated breast MRI with and without contrast given heterogeneous breast density. I have discussed the findings and recommendations with the patient. If applicable, a reminder letter will be sent to the patient regarding the next appointment. Patient will be scheduled for biopsy at her earliest convenience by the schedulers and ordering provider will be notified. BI-RADS CATEGORY  4: Suspicious. Electronically Signed   By: Meda Klinefelter MD   On: 10/13/2019 12:58   US BREAST LTD UNI RIGHT INC AXILLA  Result Date: 10/13/2019 CLINICAL DATA:  One year follow-up for LEFT axillary mass, BI-RADS 3. EXAM: DIGITAL DIAGNOSTIC BILATERAL MAMMOGRAM WITH TOMO AND CAD; ULTRASOUND RIGHT BREAST LIMITED; ULTRASOUND LEFT BREAST LIMITED COMPARISON:  Previous exam(s). ACR Breast Density Category c: The breast tissue is heterogeneously dense, which may obscure small masses. FINDINGS: In the inner RIGHT breast on CC view, there is a focal asymmetry possible associated architectural distortion. Spot compression views confirm persistence of architectural distortion with an associated spiculated mass. It measures approximately 5  mm and is best seen on CC spot image 39. It is in the breast at 1 o'clock middle depth. There is a likely correlate on ML slice 50. It is approximately 1.5 cm posterior to 2 adjacent oval circumscribed masses mammographically. There are multiple round and oval circumscribed masses bilaterally. The site of palpable concern in the LEFT axilla, there is no suspicious mammographic finding. Mammographic images were processed with CAD. Targeted ultrasound was performed of the LEFT axilla. At the site of palpable concern there is revisualization of a superficial mass located within the dermis which measures 1.0 by 0.9 x 0.5 cm, unchanged since 2020. Targeted ultrasound was performed of the RIGHT upper inner breast. There is a subtle irregular hypoechoic mass with indistinct margins at 1 o'clock 4 cm from the nipple. It is located 1.4 cm superior to 2 adjacent well-circumscribed anechoic masses with posterior acoustic enhancement consistent with cysts. It measures 3 x 3 x 4 mm. This is likely the sonographic correlate to the mammographic finding. Multiple additional scattered cysts are noted. The breast tissue is heterogeneous in echotexture. No suspicious RIGHT axillary lymph nodes are identified. IMPRESSION: 1. There is a 5 mm spiculated mass with associated architectural distortion in the RIGHT breast at 1 o'clock. Given underlying sonographic breast heterogeneity and increased conspicuity of the mass on CC mammogram, recommend stereotactic guided biopsy of this mass. 2. No suspicious RIGHT axillary lymph nodes. 3. Unchanged LEFT axillary probably benign mass, likely an epidermal inclusion cyst. This has demonstrated 1 year of sonographic stability. No mammographic evidence of malignancy in the LEFT breast. RECOMMENDATION: 1.  Stereotactic guided biopsy of the RIGHT breast x1. 2. If malignant biopsy results, recommend dedicated breast MRI with and without contrast given heterogeneous breast density. I have discussed the  findings and recommendations with the  patient. If applicable, a reminder letter will be sent to the patient regarding the next appointment. Patient will be scheduled for biopsy at her earliest convenience by the schedulers and ordering provider will be notified. BI-RADS CATEGORY  4: Suspicious. Electronically Signed   By: Meda Klinefelter MD   On: 10/13/2019 12:58   MM DIAG BREAST TOMO BILATERAL  Result Date: 10/13/2019 CLINICAL DATA:  One year follow-up for LEFT axillary mass, BI-RADS 3. EXAM: DIGITAL DIAGNOSTIC BILATERAL MAMMOGRAM WITH TOMO AND CAD; ULTRASOUND RIGHT BREAST LIMITED; ULTRASOUND LEFT BREAST LIMITED COMPARISON:  Previous exam(s). ACR Breast Density Category c: The breast tissue is heterogeneously dense, which may obscure small masses. FINDINGS: In the inner RIGHT breast on CC view, there is a focal asymmetry possible associated architectural distortion. Spot compression views confirm persistence of architectural distortion with an associated spiculated mass. It measures approximately 5 mm and is best seen on CC spot image 39. It is in the breast at 1 o'clock middle depth. There is a likely correlate on ML slice 50. It is approximately 1.5 cm posterior to 2 adjacent oval circumscribed masses mammographically. There are multiple round and oval circumscribed masses bilaterally. The site of palpable concern in the LEFT axilla, there is no suspicious mammographic finding. Mammographic images were processed with CAD. Targeted ultrasound was performed of the LEFT axilla. At the site of palpable concern there is revisualization of a superficial mass located within the dermis which measures 1.0 by 0.9 x 0.5 cm, unchanged since 2020. Targeted ultrasound was performed of the RIGHT upper inner breast. There is a subtle irregular hypoechoic mass with indistinct margins at 1 o'clock 4 cm from the nipple. It is located 1.4 cm superior to 2 adjacent well-circumscribed anechoic masses with posterior acoustic  enhancement consistent with cysts. It measures 3 x 3 x 4 mm. This is likely the sonographic correlate to the mammographic finding. Multiple additional scattered cysts are noted. The breast tissue is heterogeneous in echotexture. No suspicious RIGHT axillary lymph nodes are identified. IMPRESSION: 1. There is a 5 mm spiculated mass with associated architectural distortion in the RIGHT breast at 1 o'clock. Given underlying sonographic breast heterogeneity and increased conspicuity of the mass on CC mammogram, recommend stereotactic guided biopsy of this mass. 2. No suspicious RIGHT axillary lymph nodes. 3. Unchanged LEFT axillary probably benign mass, likely an epidermal inclusion cyst. This has demonstrated 1 year of sonographic stability. No mammographic evidence of malignancy in the LEFT breast. RECOMMENDATION: 1.  Stereotactic guided biopsy of the RIGHT breast x1. 2. If malignant biopsy results, recommend dedicated breast MRI with and without contrast given heterogeneous breast density. I have discussed the findings and recommendations with the patient. If applicable, a reminder letter will be sent to the patient regarding the next appointment. Patient will be scheduled for biopsy at her earliest convenience by the schedulers and ordering provider will be notified. BI-RADS CATEGORY  4: Suspicious. Electronically Signed   By: Meda Klinefelter MD   On: 10/13/2019 12:58   MM CLIP PLACEMENT RIGHT  Result Date: 10/20/2019 CLINICAL DATA:  Patient with indeterminate possible subtle right breast distortion. EXAM: DIAGNOSTIC RIGHT MAMMOGRAM POST STEREOTACTIC BIOPSY COMPARISON:  Previous exam(s). FINDINGS: Mammographic images were obtained following stereotactic guided biopsy of possible subtle right breast distortion. The biopsy marking clip is located approximately 7 mm posterior to the expected location of the biopsy site. IMPRESSION: Proximate 7 mm posterior migration of the biopsy marking clip relative to the  site of biopsy. Final Assessment: Post Procedure Mammograms  for Marker Placement Electronically Signed   By: Lovey Newcomer M.D.   On: 10/20/2019 11:08   MM RT BREAST BX W LOC DEV 1ST LESION IMAGE BX SPEC STEREO GUIDE  Addendum Date: 10/25/2019   ADDENDUM REPORT: 10/24/2019 11:05 ADDENDUM: PATHOLOGY revealed: A. BREAST, RIGHT, MEDIAL; STEREOTACTIC-GUIDED CORE BIOPSY: - INVASIVE MAMMARY CARCINOMA WITH FEATURES OF TUBULAR 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 - CLUSTERS OF APOCRINE MICROCYSTS. 2 mm in this sample. Grade 1. Ductal carcinoma in situ: Not identified. Lymphovascular invasion: Not identified. Pathology results are CONCORDANT with imaging findings, per Dr. Lovey Newcomer. Pathology results and recommendations below were discussed with patient by telephone on 10/23/2019. Patient reported biopsy site within normal limits with slight tenderness at the site. Post biopsy care instructions were reviewed, questions were answered and my direct phone number was provided to patient. Patient was instructed to call [facility] if any concerns or questions arise related to the biopsy. Recommendations: 1. Request for surgical consultation was relayed to Al Pimple RN and Tanya Nones RN at [facility] by Electa Sniff RN on 10/23/2019. 2. Consider bilateral breast MRI due to heterogeneous breast density. Pathology results reported by Electa Sniff RN on 10/24/2019. Electronically Signed   By: Lovey Newcomer M.D.   On: 10/24/2019 11:05   Result Date: 10/25/2019 CLINICAL DATA:  Patient with possible subtle distortion medial right breast. EXAM: RIGHT BREAST STEREOTACTIC CORE NEEDLE BIOPSY COMPARISON:  Previous exams. FINDINGS: The patient and I discussed the procedure of stereotactic-guided biopsy including benefits and alternatives. We discussed the high likelihood of a successful procedure. We discussed the risks of the procedure including infection, bleeding, tissue injury, clip  migration, and inadequate sampling. Informed written consent was given. The usual time out protocol was performed immediately prior to the procedure. Using sterile technique and 1% Lidocaine as local anesthetic, under stereotactic guidance, a 9 gauge vacuum assisted device was used to perform core needle biopsy of subtle distortion medial right breast using a cranial approach. Lesion quadrant: Upper inner quadrant At the conclusion of the procedure, X shaped tissue marker clip was deployed into the biopsy cavity. Follow-up 2-view mammogram was performed and dictated separately. IMPRESSION: Stereotactic-guided biopsy of possible subtle right breast distortion. No apparent complications. Electronically Signed: By: Lovey Newcomer M.D. On: 10/20/2019 11:07   MM RT RADIO FREQUENCY TAG LOC MAMMO GUIDE  Result Date: 10/26/2019 CLINICAL DATA:  Tag localization of the patient's known right breast cancer EXAM: NEEDLE LOCALIZATION OF THE RIGHT BREAST WITH MAMMO GUIDANCE COMPARISON:  Previous exams. FINDINGS: Patient presents for needle localization prior to surgery. I met with the patient and we discussed the procedure of needle localization including benefits and alternatives. We discussed the high likelihood of a successful procedure. We discussed the risks of the procedure, including infection, bleeding, tissue injury, and further surgery. Informed, written consent was given. The usual time-out protocol was performed immediately prior to the procedure. Using mammographic guidance, sterile technique, 1% lidocaine and a 7 cm Tag, the site of the patient's known cancer was localized using a medial approach. The images were marked for the surgeon. IMPRESSION: Tag localization of the right breast. No apparent complications. Electronically Signed   By: Dorise Bullion III M.D   On: 10/26/2019 16:43      ASSESSMENT & PLAN:  1. Malignant neoplasm of upper-inner quadrant of right breast in female, estrogen receptor positive  ([place])   2. Family history of cancer   Cancer Staging Malignant neoplasm of upper-inner quadrant  of right breast in female, estrogen receptor positive ([place]) Staging form: Breast, AJCC 8th Edition - Clinical stage from 10/26/2019: Stage IA (cT1b, cN0, cM0, G1, ER+, PR+, HER2-) - Signed by Earlie Server, MD on 10/26/2019   Images were reviewed and discussed with patient.  Pathology Findings were reviewed with patient. Clinically stage IA, ER/PR positive, HER-2 negative right breast invasive mammary carcinoma, Patient has establish care with Dr. Peyton Najjar. I agree with upfront surgery.  Dr. Peyton Najjar plans lumpectomy with sentinel lymph node biopsy. If final tumor size is 54mm or less than 76mm, no need for chemotherapy. If final tumor size is above 5 mm, will send Oncotype DX. Recommend adjuvant radiation followed by adjuvant endocrine therapy.  Patient is postmenopausal, we discussed about future aromatase inhibitor use.  Family history of pancreatic cancer and breast cancer.  I recommend genetic testing.  Patient agrees. We will refer patient to further discuss with genetic counselor. I asked patient to update Dr. Peyton Najjar if she feels that positive genetic testing may change her surgical option preference.  Orders Placed This Encounter  Procedures  . CBC with Differential/Platelet    Standing Status:   Future    Number of Occurrences:   1    Standing Expiration Date:   10/25/2020  . Comprehensive metabolic panel    Standing Status:   Future    Number of Occurrences:   1    Standing Expiration Date:   10/25/2020  . Ambulatory referral to Genetics    Referral Priority:   Routine    Referral Type:   Consultation    Referral Reason:   Specialty Services Required    Referred to Provider:   Faith Rogue T    Number of Visits Requested:   1    All questions were answered. The patient knows to call the clinic with any problems questions or concerns.  cc Lovey Newcomer, MD    Return of visit: 2  weeks after surgery. Thank you for this kind referral and the opportunity to participate in the care of this patient. A copy of today's note is routed to referring provider    Earlie Server, MD, PhD Hematology Oncology [facility] Pager- [phone] 10/26/2019

## 2019-10-26 NOTE — Progress Notes (Signed)
Met patient today during her initial medical oncology consult with Dr. Yu.  She is scheduled for lumpectomy on Monday with Dr. Cintron.  Received verbal results from Dr. Heath Jones, from pathology with ER/PR and Her2 results.  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  Patient with significant family history of cancer.  Dr. Yu has recommended genetic testing and referral to genetic counselor.  Gave patient breast cancer educational literature, "My Breast Cancer Treatment Handbook" by Judy Neese, RN.  Patient is to call with any questions or needs. 

## 2019-10-26 NOTE — Patient Instructions (Addendum)
Your procedure is scheduled on:10-30-19 MONDAY Report to [place]  @ 9:15 AM   REMEMBER: Instructions that are not followed completely may result in serious medical risk, up to and including death; or upon the discretion of your surgeon and anesthesiologist your surgery may need to be rescheduled.  Do not eat food after midnight the night before surgery.  No gum chewing, lozengers or hard candies.  You may however, drink CLEAR liquids up to 2 hours before you are scheduled to arrive for your surgery. Do not drink anything within 2 hours of your scheduled arrival time.  Clear liquids include: - water  - apple juice without pulp - gatorade (not RED) - black coffee or tea (Do NOT add milk or creamers to the coffee or tea) Do NOT drink anything that is not on this list.  TAKE THESE MEDICATIONS THE MORNING OF SURGERY WITH A SIP OF WATER:  -PROTONIX (PANTOPRAZOLE) -take one the night before and one on the morning of surgery - helps to prevent nausea after surgery. -ZYRTEC (CETIRIZINE)  Follow recommendations from Cardiologist, Pulmonologist or PCP regarding stopping Aspirin, Coumadin, Plavix, Eliquis, Pradaxa, or Pletal-ASPIRIN WAS STOPPED ON 10-23-19 MONDAY  One week prior to surgery: Stop Anti-inflammatories (NSAIDS) such as MOBIC (MELOXICAM) Advil, Aleve, Ibuprofen, Motrin, Naproxen, Naprosyn and Aspirin based products such as Excedrin, Goodys Powder, BC Powder-OK TO TAKE TYLENOL  Stop ANY OVER THE COUNTER supplements until after surgery. (You may continue taking Tylenol, Vitamin D, Vitamin B, and multivitamin.)-STOP YOUR CINNAMON AND KRILL OIL NOW-YOU MAY RESUME AFTER SURGERY  No Alcohol for 24 hours before or after surgery.  No Smoking including e-cigarettes for 24 hours prior to surgery.  No chewable tobacco products for at least 6 hours prior to surgery.  No nicotine patches on the day of surgery.  Do not use any "recreational" drugs for at least a week prior to your  surgery.  Please be advised that the combination of cocaine and anesthesia may have negative outcomes, up to and including death. If you test positive for cocaine, your surgery will be cancelled.  On the morning of surgery brush your teeth with toothpaste and water, you may rinse your mouth with mouthwash if you wish. Do not swallow any toothpaste or mouthwash.  Do not wear jewelry, make-up, hairpins, clips or nail polish.  Do not wear lotions, powders, or perfumes.   Do not shave 48 hours prior to surgery.   Contact lenses, hearing aids and dentures may not be worn into surgery.  Do not bring valuables to the hospital. [facility] is not responsible for any missing/lost belongings or valuables.   Use CHG Soap or wipes as directed on instruction sheet.  Bring your C-PAP to the hospital with you    Notify your doctor if there is any change in your medical condition (cold, fever, infection).  Wear comfortable clothing (specific to your surgery type) to the hospital.  Plan for stool softeners for home use; pain medications have a tendency to cause constipation. You can also help prevent constipation by eating foods high in fiber such as fruits and vegetables and drinking plenty of fluids as your diet allows.  After surgery, you can help prevent lung complications by doing breathing exercises.  Take deep breaths and cough every 1-2 hours. Your doctor may order a device called an Incentive Spirometer to help you take deep breaths. When coughing or sneezing, hold a pillow firmly against your incision with both hands. This is called "splinting." Doing  this helps protect your incision. It also decreases belly discomfort.  If you are being admitted to the hospital overnight, leave your suitcase in the car. After surgery it may be brought to your room.  If you are being discharged the day of surgery, you will not be allowed to drive home. You will need a responsible adult (18 years or  older) to drive you home and stay with you that night.   If you are taking public transportation, you will need to have a responsible adult (18 years or older) with you. Please confirm with your physician that it is acceptable to use public transportation.   Please call the [place] [facility] at [phone] if you have any questions about these instructions.  Visitation Policy:  Patients undergoing a surgery or procedure may have one family member or support person with them as long as that person is not COVID-19 positive or experiencing its symptoms.  That person may remain in the waiting area during the procedure.  Inpatient Visitation Update:   In an effort to ensure the safety of our team members and our patients, we are implementing a change to our visitation policy:  Effective Monday, Aug. 9, at 7 a.m., inpatients will be allowed one support person.  o The support person may change daily.  o The support person must pass our screening, gel in and out, and wear a mask at all times, including in the patient's room.  o Patients must also wear a mask when staff or their support person are in the room.  o Masking is required regardless of vaccination status.  Systemwide, no visitors 17 or younger.

## 2019-10-26 NOTE — Progress Notes (Signed)
Pt here to establish care for malignant neoplasm of right breast.

## 2019-10-27 ENCOUNTER — Encounter
Admission: RE | Admit: 2019-10-27 | Discharge: 2019-10-27 | Disposition: A | Discharge status: Home / Self Care | Payer: Medicare PPO | Source: Ambulatory Visit | Attending: General Surgery | Admitting: General Surgery

## 2019-10-27 ENCOUNTER — Other Ambulatory Visit: Payer: Medicare PPO

## 2019-10-27 ENCOUNTER — Telehealth: Payer: Self-pay

## 2019-10-27 DIAGNOSIS — Z01818 Encounter for other preprocedural examination: Secondary | ICD-10-CM | POA: Diagnosis not present

## 2019-10-27 DIAGNOSIS — I491 Atrial premature depolarization: Secondary | ICD-10-CM | POA: Insufficient documentation

## 2019-10-27 DIAGNOSIS — Z20822 Contact with and (suspected) exposure to covid-19: Secondary | ICD-10-CM | POA: Diagnosis not present

## 2019-10-27 DIAGNOSIS — I10 Essential (primary) hypertension: Secondary | ICD-10-CM | POA: Diagnosis not present

## 2019-10-27 LAB — BASIC METABOLIC PANEL
Anion gap: 9 (ref 5–15)
BUN: 13 mg/dL (ref 8–23)
CO2: 27 mmol/L (ref 22–32)
Calcium: 9.2 mg/dL (ref 8.9–10.3)
Chloride: 105 mmol/L (ref 98–111)
Creatinine, Ser: 0.87 mg/dL (ref 0.44–1.00)
GFR calc Af Amer: >60 mL/min (ref >60)
GFR calc non Af Amer: >60 mL/min (ref >60)
Glucose, Bld: 157 mg/dL — ABNORMAL HIGH (ref 70–99)
Potassium: 3.6 mmol/L (ref 3.5–5.1)
Sodium: 141 mmol/L (ref 135–145)

## 2019-10-27 NOTE — Telephone Encounter (Signed)
Please schedule and inform patient of appt details.  Thanks

## 2019-10-27 NOTE — Telephone Encounter (Signed)
-----   Message from Earlie Server, MD sent at 10/26/2019  7:45 PM EDT ----- Please schedule her to see me 2-3 weeks from next Monday- her surgery day. MD only

## 2019-10-27 NOTE — Telephone Encounter (Signed)
Done... Pt has been sched to RTC as requested... Appt date is 11/20/19 @ 1045 per pt request due to just returning from vacation Pt is aware

## 2019-10-28 LAB — SARS CORONAVIRUS 2 (TAT 6-24 HRS): SARS Coronavirus 2: NEGATIVE

## 2019-10-30 ENCOUNTER — Encounter
Admission: RE | Disposition: A | Discharge status: Home / Self Care | Payer: Self-pay | Source: Home / Self Care | Attending: General Surgery

## 2019-10-30 ENCOUNTER — Other Ambulatory Visit: Payer: Self-pay

## 2019-10-30 ENCOUNTER — Ambulatory Visit
Admission: RE | Admit: 2019-10-30 | Discharge: 2019-10-30 | Disposition: A | Discharge status: Home / Self Care | Payer: Medicare PPO | Source: Ambulatory Visit | Attending: General Surgery | Admitting: General Surgery

## 2019-10-30 ENCOUNTER — Encounter: Payer: Self-pay | Admitting: General Surgery

## 2019-10-30 ENCOUNTER — Ambulatory Visit
Admission: RE | Admit: 2019-10-30 | Discharge: 2019-10-30 | Disposition: A | Discharge status: Home / Self Care | Payer: Medicare PPO | Attending: General Surgery | Admitting: General Surgery

## 2019-10-30 ENCOUNTER — Ambulatory Visit: Payer: Medicare PPO | Admitting: Urgent Care

## 2019-10-30 DIAGNOSIS — G4730 Sleep apnea, unspecified: Secondary | ICD-10-CM | POA: Insufficient documentation

## 2019-10-30 DIAGNOSIS — C50919 Malignant neoplasm of unspecified site of unspecified female breast: Secondary | ICD-10-CM

## 2019-10-30 DIAGNOSIS — Z791 Long term (current) use of non-steroidal anti-inflammatories (NSAID): Secondary | ICD-10-CM | POA: Diagnosis not present

## 2019-10-30 DIAGNOSIS — Z803 Family history of malignant neoplasm of breast: Secondary | ICD-10-CM | POA: Diagnosis not present

## 2019-10-30 DIAGNOSIS — E669 Obesity, unspecified: Secondary | ICD-10-CM | POA: Diagnosis not present

## 2019-10-30 DIAGNOSIS — E785 Hyperlipidemia, unspecified: Secondary | ICD-10-CM | POA: Insufficient documentation

## 2019-10-30 DIAGNOSIS — I10 Essential (primary) hypertension: Secondary | ICD-10-CM | POA: Diagnosis not present

## 2019-10-30 DIAGNOSIS — Z6833 Body mass index (BMI) 33.0-33.9, adult: Secondary | ICD-10-CM | POA: Insufficient documentation

## 2019-10-30 DIAGNOSIS — Z79899 Other long term (current) drug therapy: Secondary | ICD-10-CM | POA: Insufficient documentation

## 2019-10-30 DIAGNOSIS — Z7982 Long term (current) use of aspirin: Secondary | ICD-10-CM | POA: Diagnosis not present

## 2019-10-30 DIAGNOSIS — K219 Gastro-esophageal reflux disease without esophagitis: Secondary | ICD-10-CM | POA: Diagnosis not present

## 2019-10-30 DIAGNOSIS — C50211 Malignant neoplasm of upper-inner quadrant of right female breast: Secondary | ICD-10-CM | POA: Insufficient documentation

## 2019-10-30 HISTORY — PX: PART MASTECTOMY,RADIO FREQUENCY LOCALIZER,AXILLARY SENTINEL NODE BIOPSY: SHX6901

## 2019-10-30 SURGERY — PART MASTECTOMY,RADIO FREQUENCY LOCALIZER,AXILLARY SENTINEL NODE BIOPSY
Anesthesia: General | Laterality: Right

## 2019-10-30 MED ORDER — MIDAZOLAM HCL 2 MG/2ML IJ SOLN
INTRAMUSCULAR | Status: AC
  Filled 2019-10-30: qty 2

## 2019-10-30 MED ORDER — HYDROCODONE-ACETAMINOPHEN 5-325 MG PO TABS
ORAL_TABLET | ORAL | Status: AC
  Administered 2019-10-30: 1
  Filled 2019-10-30: qty 1

## 2019-10-30 MED ORDER — FENTANYL CITRATE (PF) 100 MCG/2ML IJ SOLN
25.0000 ug | INTRAMUSCULAR | Status: DC | PRN
  Administered 2019-10-30 (×2): 25 ug via INTRAVENOUS

## 2019-10-30 MED ORDER — DEXAMETHASONE SODIUM PHOSPHATE 10 MG/ML IJ SOLN
INTRAMUSCULAR | Status: AC
  Filled 2019-10-30: qty 1

## 2019-10-30 MED ORDER — METHYLENE BLUE 0.5 % INJ SOLN
INTRAVENOUS | Status: AC
  Filled 2019-10-30: qty 10

## 2019-10-30 MED ORDER — DEXAMETHASONE SODIUM PHOSPHATE 10 MG/ML IJ SOLN
INTRAMUSCULAR | Status: DC | PRN
  Administered 2019-10-30: 10 mg via INTRAVENOUS

## 2019-10-30 MED ORDER — BUPIVACAINE-EPINEPHRINE (PF) 0.5% -1:200000 IJ SOLN
INTRAMUSCULAR | Status: DC | PRN
  Administered 2019-10-30: 30 mL

## 2019-10-30 MED ORDER — BUPIVACAINE-EPINEPHRINE (PF) 0.5% -1:200000 IJ SOLN
INTRAMUSCULAR | Status: AC
  Filled 2019-10-30: qty 30

## 2019-10-30 MED ORDER — CHLORHEXIDINE GLUCONATE 0.12 % MT SOLN: 15.0000 mL | Freq: Once | OROMUCOSAL | Status: AC

## 2019-10-30 MED ORDER — LIDOCAINE HCL (CARDIAC) PF 100 MG/5ML IV SOSY
PREFILLED_SYRINGE | INTRAVENOUS | Status: DC | PRN
  Administered 2019-10-30: 60 mg via INTRAVENOUS

## 2019-10-30 MED ORDER — CEFAZOLIN SODIUM-DEXTROSE 2-4 GM/100ML-% IV SOLN
2.0000 g | INTRAVENOUS | Status: AC
  Administered 2019-10-30: 2 g via INTRAVENOUS

## 2019-10-30 MED ORDER — ONDANSETRON HCL 4 MG/2ML IJ SOLN
INTRAMUSCULAR | Status: AC
  Filled 2019-10-30: qty 2

## 2019-10-30 MED ORDER — FENTANYL CITRATE (PF) 100 MCG/2ML IJ SOLN
INTRAMUSCULAR | Status: AC
  Administered 2019-10-30: 25 ug via INTRAVENOUS
  Filled 2019-10-30: qty 2

## 2019-10-30 MED ORDER — MIDAZOLAM HCL 2 MG/2ML IJ SOLN
INTRAMUSCULAR | Status: DC | PRN
  Administered 2019-10-30: 2 mg via INTRAVENOUS

## 2019-10-30 MED ORDER — LACTATED RINGERS IV SOLN: INTRAVENOUS | Status: DC

## 2019-10-30 MED ORDER — ONDANSETRON HCL 4 MG/2ML IJ SOLN
INTRAMUSCULAR | Status: DC | PRN
  Administered 2019-10-30: 4 mg via INTRAVENOUS

## 2019-10-30 MED ORDER — LIDOCAINE HCL (PF) 2 % IJ SOLN
INTRAMUSCULAR | Status: AC
  Filled 2019-10-30: qty 5

## 2019-10-30 MED ORDER — ACETAMINOPHEN 10 MG/ML IV SOLN
INTRAVENOUS | Status: AC
  Filled 2019-10-30: qty 100

## 2019-10-30 MED ORDER — PROPOFOL 10 MG/ML IV BOLUS
INTRAVENOUS | Status: DC | PRN
  Administered 2019-10-30: 150 mg via INTRAVENOUS

## 2019-10-30 MED ORDER — KETOROLAC TROMETHAMINE 15 MG/ML IJ SOLN
INTRAMUSCULAR | Status: AC
  Administered 2019-10-30: 15 mg via INTRAVENOUS
  Filled 2019-10-30: qty 1

## 2019-10-30 MED ORDER — PHENYLEPHRINE HCL (PRESSORS) 10 MG/ML IV SOLN
INTRAVENOUS | Status: DC | PRN
  Administered 2019-10-30 (×3): 100 ug via INTRAVENOUS

## 2019-10-30 MED ORDER — KETOROLAC TROMETHAMINE 15 MG/ML IJ SOLN: 15.0000 mg | Freq: Once | INTRAMUSCULAR | Status: AC

## 2019-10-30 MED ORDER — METHYLENE BLUE 0.5 % INJ SOLN
INTRAVENOUS | Status: DC | PRN
  Administered 2019-10-30: 5 mL

## 2019-10-30 MED ORDER — FENTANYL CITRATE (PF) 100 MCG/2ML IJ SOLN
INTRAMUSCULAR | Status: AC
Start: 2019-10-30 — End: ?
  Filled 2019-10-30: qty 2

## 2019-10-30 MED ORDER — FENTANYL CITRATE (PF) 100 MCG/2ML IJ SOLN
INTRAMUSCULAR | Status: DC | PRN
Start: 2019-10-30 — End: 2019-10-30
  Administered 2019-10-30 (×2): 25 ug via INTRAVENOUS
  Administered 2019-10-30 (×2): 50 ug via INTRAVENOUS
  Administered 2019-10-30: 25 ug via INTRAVENOUS

## 2019-10-30 MED ORDER — PROPOFOL 10 MG/ML IV BOLUS
INTRAVENOUS | Status: AC
  Filled 2019-10-30: qty 20

## 2019-10-30 MED ORDER — DEXMEDETOMIDINE (PRECEDEX) IN NS 20 MCG/5ML (4 MCG/ML) IV SYRINGE
PREFILLED_SYRINGE | INTRAVENOUS | Status: AC
  Filled 2019-10-30: qty 5

## 2019-10-30 MED ORDER — ACETAMINOPHEN 10 MG/ML IV SOLN
INTRAVENOUS | Status: DC | PRN
  Administered 2019-10-30: 1000 mg via INTRAVENOUS

## 2019-10-30 MED ORDER — ORAL CARE MOUTH RINSE: 15.0000 mL | Freq: Once | OROMUCOSAL | Status: AC

## 2019-10-30 MED ORDER — CEFAZOLIN SODIUM-DEXTROSE 2-4 GM/100ML-% IV SOLN
INTRAVENOUS | Status: AC
  Filled 2019-10-30: qty 100

## 2019-10-30 MED ORDER — DEXMEDETOMIDINE (PRECEDEX) IN NS 20 MCG/5ML (4 MCG/ML) IV SYRINGE
PREFILLED_SYRINGE | INTRAVENOUS | Status: DC | PRN
  Administered 2019-10-30 (×4): 4 ug via INTRAVENOUS

## 2019-10-30 MED ORDER — CHLORHEXIDINE GLUCONATE 0.12 % MT SOLN
OROMUCOSAL | Status: AC
  Administered 2019-10-30: 15 mL via OROMUCOSAL
  Filled 2019-10-30: qty 15

## 2019-10-30 MED ORDER — ONDANSETRON HCL 4 MG/2ML IJ SOLN: 4.0000 mg | Freq: Once | INTRAMUSCULAR | Status: DC | PRN

## 2019-10-30 MED ORDER — HYDROCODONE-ACETAMINOPHEN 5-325 MG PO TABS
1.0000 | ORAL_TABLET | ORAL | 0 refills | Status: AC | PRN
Start: 2019-10-30 — End: 2019-11-02

## 2019-10-30 MED ORDER — TECHNETIUM TC 99M SULFUR COLLOID FILTERED
1.0000 | Freq: Once | INTRAVENOUS | Status: AC | PRN
  Administered 2019-10-30: 0.751 via INTRADERMAL

## 2019-10-30 SURGICAL SUPPLY — 52 items
ADH SKN CLS APL DERMABOND .7 (GAUZE/BANDAGES/DRESSINGS) ×1
APL PRP STRL LF DISP 70% ISPRP (MISCELLANEOUS) ×1
BINDER BREAST LRG (GAUZE/BANDAGES/DRESSINGS) IMPLANT
BINDER BREAST MEDIUM (GAUZE/BANDAGES/DRESSINGS) IMPLANT
BINDER BREAST XLRG (GAUZE/BANDAGES/DRESSINGS) IMPLANT
BINDER BREAST XXLRG (GAUZE/BANDAGES/DRESSINGS) IMPLANT
BLADE SURG 15 STRL LF DISP TIS (BLADE) ×2 IMPLANT
BLADE SURG 15 STRL SS (BLADE) ×6
CANISTER SUCT 1200ML W/VALVE (MISCELLANEOUS) ×3 IMPLANT
CHLORAPREP W/TINT 26 (MISCELLANEOUS) ×3 IMPLANT
CNTNR SPEC 2.5X3XGRAD LEK (MISCELLANEOUS)
CONT SPEC 4OZ STER OR WHT (MISCELLANEOUS)
CONT SPEC 4OZ STRL OR WHT (MISCELLANEOUS)
CONTAINER SPEC 2.5X3XGRAD LEK (MISCELLANEOUS) IMPLANT
COVER WAND RF STERILE (DRAPES) ×3 IMPLANT
DERMABOND ADVANCED (GAUZE/BANDAGES/DRESSINGS) ×2
DERMABOND ADVANCED .7 DNX12 (GAUZE/BANDAGES/DRESSINGS) ×1 IMPLANT
DEVICE DUBIN SPECIMEN MAMMOGRA (MISCELLANEOUS) ×3 IMPLANT
DRAPE LAPAROTOMY TRNSV 106X77 (MISCELLANEOUS) ×3 IMPLANT
DRSG GAUZE FLUFF 36X18 (GAUZE/BANDAGES/DRESSINGS) ×3 IMPLANT
ELECT CAUTERY BLADE 6.4 (BLADE) ×3 IMPLANT
ELECT REM PT RETURN 9FT ADLT (ELECTROSURGICAL) ×3
ELECTRODE REM PT RTRN 9FT ADLT (ELECTROSURGICAL) ×1 IMPLANT
GLOVE BIO SURGEON STRL SZ 6.5 (GLOVE) ×6 IMPLANT
GLOVE BIO SURGEONS STRL SZ 6.5 (GLOVE) ×3
GLOVE BIOGEL PI IND STRL 6.5 (GLOVE) ×3 IMPLANT
GLOVE BIOGEL PI INDICATOR 6.5 (GLOVE) ×6
GOWN STRL REUS W/ TWL LRG LVL3 (GOWN DISPOSABLE) ×3 IMPLANT
GOWN STRL REUS W/TWL LRG LVL3 (GOWN DISPOSABLE) ×9
KIT MARKER MARGIN INK (KITS) ×3 IMPLANT
KIT TURNOVER KIT A (KITS) ×3 IMPLANT
LABEL OR SOLS (LABEL) ×3 IMPLANT
MARGIN MAP 10MM (MISCELLANEOUS) ×3 IMPLANT
MARKER MARGIN CORRECT CLIP (MARKER) ×3 IMPLANT
NEEDLE HYPO 22GX1.5 SAFETY (NEEDLE) ×3 IMPLANT
NEEDLE HYPO 25X1 1.5 SAFETY (NEEDLE) ×3 IMPLANT
PACK BASIN MINOR (MISCELLANEOUS) ×3 IMPLANT
RETRACTOR RING XSMALL (MISCELLANEOUS) ×1 IMPLANT
RTRCTR WOUND ALEXIS 13CM XS SH (MISCELLANEOUS) ×3
SET LOCALIZER 20 PROBE US (MISCELLANEOUS) ×3 IMPLANT
SLEVE PROBE SENORX GAMMA FIND (MISCELLANEOUS) ×3 IMPLANT
SUT ETHILON 3-0 FS-10 30 BLK (SUTURE) ×3
SUT MNCRL 4-0 (SUTURE) ×6
SUT MNCRL 4-0 27XMFL (SUTURE) ×2
SUT SILK 2 0 SH (SUTURE) ×3 IMPLANT
SUT VIC AB 3-0 SH 27 (SUTURE) ×6
SUT VIC AB 3-0 SH 27X BRD (SUTURE) ×2 IMPLANT
SUTURE EHLN 3-0 FS-10 30 BLK (SUTURE) ×1 IMPLANT
SUTURE MNCRL 4-0 27XMF (SUTURE) ×2 IMPLANT
SYR 10ML LL (SYRINGE) ×6 IMPLANT
SYR BULB IRRIG 60ML STRL (SYRINGE) ×3 IMPLANT
WATER STERILE IRR 1000ML POUR (IV SOLUTION) ×3 IMPLANT

## 2019-10-30 NOTE — Transfer of Care (Signed)
Immediate Anesthesia Transfer of Care Note  Patient: Barbara [name]  Procedure(s) Performed: PART [place] NODE BIOPSY (Right )  Patient Location: PACU  Anesthesia Type:General  Level of Consciousness: drowsy  Airway & Oxygen Therapy: Patient Spontanous Breathing and Patient connected to face mask oxygen  Post-op Assessment: Report given to RN and Post -op Vital signs reviewed and stable  Post vital signs: Reviewed and stable  Last Vitals:  Vitals Value Taken Time  BP 104/67 10/30/19 1422  Temp    Pulse 75 10/30/19 1423  Resp 23 10/30/19 1423  SpO2 94 % 10/30/19 1423  Vitals shown include unvalidated device data.  Last Pain:  Vitals:   10/30/19 1022  TempSrc: Oral  PainSc: 0-No pain         Complications: No complications documented.

## 2019-10-30 NOTE — Interval H&P Note (Signed)
History and Physical Interval Note:  10/30/2019 11:41 AM  Barbara [name]  has presented today for surgery, with the diagnosis of C50.211.  The various methods of treatment have been discussed with the patient and family. After consideration of risks, benefits and other options for treatment, the patient has consented to  Procedure(s): PART [place] (Right) as a surgical intervention.  The patient's history has been reviewed, patient examined, no change in status, stable for surgery.  I have reviewed the patient's chart and labs.  Right breast marked in the pre procedure room. Questions were answered to the patient's satisfaction.     Herbert Pun

## 2019-10-30 NOTE — Anesthesia Preprocedure Evaluation (Signed)
Anesthesia Evaluation  Patient identified by MRN, date of birth, ID band Patient awake    Reviewed: Allergy & Precautions, H&P , NPO status , Patient's Chart, lab work & pertinent test results, reviewed documented beta blocker date and time   Airway Mallampati: II  TM Distance: >3 FB Neck ROM: full    Dental  (+) Teeth Intact   Pulmonary sleep apnea ,    Pulmonary exam normal        Cardiovascular Exercise Tolerance: Poor hypertension, On Medications negative cardio ROS Normal cardiovascular exam Rate:Normal     Neuro/Psych  Headaches, negative psych ROS   GI/Hepatic Neg liver ROS, GERD  Medicated,  Endo/Other  negative endocrine ROS  Renal/GU negative Renal ROS  negative genitourinary   Musculoskeletal   Abdominal   Peds  Hematology negative hematology ROS (+)   Anesthesia Other Findings   Reproductive/Obstetrics negative OB ROS                             Anesthesia Physical Anesthesia Plan  ASA: III  Anesthesia Plan: General LMA   Post-op Pain Management:    Induction:   PONV Risk Score and Plan:   Airway Management Planned:   Additional Equipment:   Intra-op Plan:   Post-operative Plan:   Informed Consent: I have reviewed the patients History and Physical, chart, labs and discussed the procedure including the risks, benefits and alternatives for the proposed anesthesia with the patient or authorized representative who has indicated his/her understanding and acceptance.       Plan Discussed with: CRNA  Anesthesia Plan Comments:         Anesthesia Quick Evaluation

## 2019-10-30 NOTE — Op Note (Signed)
Preoperative diagnosis: Right breast carcinoma.  Postoperative diagnosis: Right breast carcinoma.   Procedure: Right radiofrequency tag-localized partial mastectomy.                       Right Axillary Sentinel Lymph node biopsy  Anesthesia: GETA  Surgeon: Dr. Windell Moment  Wound Classification: Clean  Indications: Patient is a 69 y.o. female with a nonpalpable right breast mass noted on mammography with core biopsy demonstrating invasive mammary carcinoma requires radiofrequency tag-localized partial mastectomy for treatment with sentinel lymph node biopsy.   Findings: 1. Specimen mammography shows marker and tag on specimen 2. Pathology call refers gross examination of margins was close to anterior and inferior margin 3. No other palpable mass or lymph node identified.   Description of procedure: Preoperative radiofrequency tag localization was performed by radiology. In the nuclear medicine suite, the subareolar region was injected with Tc-99 sulfur colloid. Localization studies were reviewed. The patient was taken to the operating room and placed supine on the operating table, and after general anesthesia the right chest and axilla were prepped and draped in the usual sterile fashion. I personally infected methylene blue subdermally for intra operative mapping of axillary sentinel lymph node.  A time-out was completed verifying correct patient, procedure, site, positioning, and implant(s) and/or special equipment prior to beginning this procedure.  By comparing the localization studies and interrogation with Localizer device, the probable trajectory and location of the mass was visualized. A circumareolar skin incision was planned in such a way as to minimize the amount of dissection to reach the mass.  The skin incision was made. Flaps were raised and the location of the tag was confirmed with Localizer device confirmed. A 2-0 silk figure-of-eight stay suture was placed and used for  retraction. Dissection was then taken down circumferentially, taking care to include the entire localizing tag and a wide margin of grossly normal tissue. The specimen and entire localizing tag were removed. The specimen was oriented and sent to radiology with the localization studies.  Pathology called back reporting that there was a hematoma at the biopsy that was unable to rule out extension to the anterior and inferior margin.  Reexcision of the anterior and inferior margin was done. The wound was irrigated. Hemostasis was checked. The wound was closed with interrupted sutures of 3-0 Vicryl and a subcuticular suture of Monocryl 3-0. No attempt was made to close the dead space.   A hand-held gamma probe was used to identify the location of the hottest spot in the axilla. An incision was made around the caudal axillary hairline. Dissection was carried down until subdermal facias was advanced. The probe was placed and again, the point of maximal count was found. Dissection continue until nodule was identified. The probe was placed in contact with the node. The node was excised in its entirety.  An additional hot spot was detected and the node was excised in similar fashion. No additional hot spots were identified. No clinically abnormal nodes were palpated. The procedure was terminated. Hemostasis was achieved and the wound closed in layers with deep interrupted 3-0 Vicryl and skin was closed with subcuticular suture of Monocryl 3-0.   The patient tolerated the procedure well and was taken to the postanesthesia care unit in stable condition.    Specimen: Right Breast mass                     Sentinel Lymph nodes  Complications: None  Estimated Blood  Loss: 5 mL

## 2019-10-30 NOTE — Discharge Instructions (Signed)
°  Diet: Resume home heart healthy regular diet.  ° °Activity: Increase activity as tolerated. Light activity and walking are encouraged. Do not drive or drink alcohol if taking narcotic pain medications. ° °Wound care: May shower with soapy water and pat dry (do not rub incisions), but no baths or submerging incision underwater until follow-up. (no swimming)  ° °Medications: Resume all home medications. For mild to moderate pain: acetaminophen (Tylenol) or ibuprofen (if no kidney disease). Combining Tylenol with alcohol can substantially increase your risk of causing liver disease. Narcotic pain medications, if prescribed, can be used for severe pain, though may cause nausea, constipation, and drowsiness. Do not combine Tylenol and Norco within a 6 hour period as Norco contains Tylenol. If you do not need the narcotic pain medication, you do not need to fill the prescription. ° °Call office ([phone]) at any time if any questions, worsening pain, fevers/chills, bleeding, drainage from incision site, or other concerns. ° °AMBULATORY SURGERY  °DISCHARGE INSTRUCTIONS ° ° °1) The drugs that you were given will stay in your system until tomorrow so for the next 24 hours you should not: ° °A) Drive an automobile °B) Make any legal decisions °C) Drink any alcoholic beverage ° ° °2) You may resume regular meals tomorrow.  Today it is better to start with liquids and gradually work up to solid foods. ° °You may eat anything you prefer, but it is better to start with liquids, then soup and crackers, and gradually work up to solid foods. ° ° °3) Please notify your doctor immediately if you have any unusual bleeding, trouble breathing, redness and pain at the surgery site, drainage, fever, or pain not relieved by medication. ° ° ° °4) Additional Instructions: ° ° ° ° ° ° ° °Please contact your physician with any problems or Same Day Surgery at [phone], Monday through Friday 6 am to 4 pm, or [facility]  number at [phone]. °

## 2019-10-30 NOTE — Anesthesia Procedure Notes (Signed)
Procedure Name: LMA Insertion Date/Time: 10/30/2019 11:56 AM Performed by: Jerrye Noble, CRNA Pre-anesthesia Checklist: Patient identified, Emergency Drugs available, Suction available and Patient being monitored Patient Re-evaluated:Patient Re-evaluated prior to induction Oxygen Delivery Method: Circle system utilized Preoxygenation: Pre-oxygenation with 100% oxygen Induction Type: IV induction Ventilation: Mask ventilation without difficulty LMA: LMA inserted LMA Size: 4.0 Number of attempts: 1 Placement Confirmation: ETT inserted through vocal cords under direct vision Tube secured with: Tape Dental Injury: Teeth and Oropharynx as per pre-operative assessment

## 2019-10-31 ENCOUNTER — Encounter: Payer: Self-pay | Admitting: General Surgery

## 2019-10-31 NOTE — Anesthesia Postprocedure Evaluation (Signed)
Anesthesia Post Note  Patient: CENIA ZARAGOSA  Procedure(s) Performed: PART MASTECTOMY,RADIO FREQUENCY LOCALIZER,AXILLARY SENTINEL NODE BIOPSY (Right )  Patient location during evaluation: PACU Anesthesia Type: General Level of consciousness: awake and alert Pain management: pain level controlled Vital Signs Assessment: post-procedure vital signs reviewed and stable Respiratory status: spontaneous breathing, nonlabored ventilation, respiratory function stable and patient connected to nasal cannula oxygen Cardiovascular status: blood pressure returned to baseline and stable Postop Assessment: no apparent nausea or vomiting Anesthetic complications: no   No complications documented.   Last Vitals:  Vitals:   10/30/19 1602 10/30/19 1646  BP: 133/71 119/61  Pulse: 78 80  Resp: 18 16  Temp: (!) 36.1 C   SpO2: 100% 97%    Last Pain:  Vitals:   10/30/19 1646  TempSrc:   PainSc: [place] Jakavion Bilodeau

## 2019-11-02 ENCOUNTER — Other Ambulatory Visit: Payer: Self-pay | Admitting: Anatomic Pathology & Clinical Pathology

## 2019-11-02 LAB — SURGICAL PATHOLOGY

## 2019-11-08 ENCOUNTER — Encounter: Payer: Self-pay | Admitting: *Deleted

## 2019-11-08 ENCOUNTER — Encounter: Payer: Self-pay | Admitting: Licensed Clinical Social Worker

## 2019-11-08 ENCOUNTER — Inpatient Hospital Stay: Payer: Medicare PPO | Attending: Oncology | Admitting: Licensed Clinical Social Worker

## 2019-11-08 ENCOUNTER — Inpatient Hospital Stay: Payer: Medicare PPO

## 2019-11-08 ENCOUNTER — Other Ambulatory Visit: Payer: Self-pay

## 2019-11-08 DIAGNOSIS — Z79899 Other long term (current) drug therapy: Secondary | ICD-10-CM | POA: Insufficient documentation

## 2019-11-08 DIAGNOSIS — C50211 Malignant neoplasm of upper-inner quadrant of right female breast: Secondary | ICD-10-CM | POA: Diagnosis not present

## 2019-11-08 DIAGNOSIS — Z803 Family history of malignant neoplasm of breast: Secondary | ICD-10-CM | POA: Insufficient documentation

## 2019-11-08 DIAGNOSIS — Z9049 Acquired absence of other specified parts of digestive tract: Secondary | ICD-10-CM | POA: Insufficient documentation

## 2019-11-08 DIAGNOSIS — Z801 Family history of malignant neoplasm of trachea, bronchus and lung: Secondary | ICD-10-CM

## 2019-11-08 DIAGNOSIS — Z809 Family history of malignant neoplasm, unspecified: Secondary | ICD-10-CM | POA: Insufficient documentation

## 2019-11-08 DIAGNOSIS — K219 Gastro-esophageal reflux disease without esophagitis: Secondary | ICD-10-CM | POA: Insufficient documentation

## 2019-11-08 DIAGNOSIS — Z170 Estrogen receptor positive status [ER+]: Secondary | ICD-10-CM

## 2019-11-08 DIAGNOSIS — Z8049 Family history of malignant neoplasm of other genital organs: Secondary | ICD-10-CM | POA: Insufficient documentation

## 2019-11-08 DIAGNOSIS — I10 Essential (primary) hypertension: Secondary | ICD-10-CM | POA: Insufficient documentation

## 2019-11-08 DIAGNOSIS — Z800 Family history of malignant neoplasm of digestive organs: Secondary | ICD-10-CM | POA: Insufficient documentation

## 2019-11-08 NOTE — Progress Notes (Signed)
Called to check on patient.  She had surgery last week.  She is doing well with no complaints.  No needs at this time.

## 2019-11-08 NOTE — Progress Notes (Signed)
REFERRING PROVIDER: °Yu, Zhou, MD °[address] °[place],   [address] ° °PRIMARY PROVIDER:  °Feldpausch, Dale E, MD ° °PRIMARY REASON FOR VISIT:  °1. Malignant neoplasm of upper-inner quadrant of right breast in female, estrogen receptor positive (HCC)   °2. Family history of breast cancer   °3. Family history of pancreatic cancer   °4. Family history of uterine cancer   °5. Family history of lung cancer   ° ° ° °HISTORY OF PRESENT ILLNESS:   °Barbara [name], a 69 y.o. female, was seen for a [facility] cancer genetics consultation at the request of Dr. Yu due to a personal and family history of cancer.  [name] presents to clinic today to discuss the possibility of a hereditary predisposition to cancer, genetic testing, and to further clarify her future cancer risks, as well as potential cancer risks for family members.  ° °In 2021, at the age of 69, Ms. Barbara [name] was diagnosed with invasive mammary carcinoma of the right breast, ER/PR+, Her2-. She had a lumpectomy on 9/27.  ° °CANCER HISTORY:  °Oncology History  °Malignant neoplasm of upper-inner quadrant of right breast in female, estrogen receptor positive (HCC)  °10/26/2019 Initial Diagnosis  ° Malignant neoplasm of upper-inner quadrant of right breast in female, estrogen receptor positive (HCC) °  °10/26/2019 Cancer Staging  ° Staging form: Breast, AJCC 8th Edition °- Clinical stage from 10/26/2019: Stage IA (cT1b, cN0, cM0, G1, ER+, PR+, HER2-) - Signed by Yu, Zhou, MD on 10/26/2019 °  ° ° ° °RISK FACTORS:  °Menarche was at age 13-14.  °First live birth at age 25.  °OCP use for approximately 2 years.  °Ovaries intact: yes.  °Hysterectomy: no.  °Menopausal status: postmenopausal.  °HRT use: 0 years. °Colonoscopy: yes; normal. °Mammogram within the last year: yes.  °Up to date with pelvic exams: yes. °Any excessive radiation exposure in the past: no ° °Past Medical History:  °Diagnosis Date  °• Cancer (HCC)   °• Family history of breast cancer   °• Family  history of lung cancer   °• Family history of pancreatic cancer   °• Family history of uterine cancer   °• GERD (gastroesophageal reflux disease)   °• Headache   ° migraines  °• Hypertension   °• Pre-diabetes   °• Sleep apnea   ° uses cpap  ° ° °Past Surgical History:  °Procedure Laterality Date  °• APPENDECTOMY    °• BREAST BIOPSY Right 10/20/2019  ° Affirm bx-"X" clip path pending  °• BREAST CYST EXCISION Right   °• BREAST CYST EXCISION Right   °• BREAST CYST EXCISION Left   °• BREAST SURGERY    °• CHOLECYSTECTOMY    °• PART MASTECTOMY,RADIO FREQUENCY LOCALIZER,AXILLARY SENTINEL NODE BIOPSY Right 10/30/2019  ° Procedure: PART MASTECTOMY,RADIO FREQUENCY LOCALIZER,AXILLARY SENTINEL NODE BIOPSY;  Surgeon: Cintron-Diaz, Edgardo, MD;  Location: [facility] ORS;  Service: General;  Laterality: Right;  ° ° °Social History  ° °Socioeconomic History  °• Marital status: Married  °  Spouse name: Not on file  °• Number of children: Not on file  °• Years of education: Not on file  °• Highest education level: Not on file  °Occupational History  °• Not on file  °Tobacco Use  °• Smoking status: Never Smoker  °• Smokeless tobacco: Never Used  °Vaping Use  °• Vaping Use: Never used  °Substance and Sexual Activity  °• Alcohol use: No  °• Drug use: No  °• Sexual activity: Not on file  °Other Topics   Concern   Not on file  Social History Narrative   Not on file   Social Determinants of Health   Financial Resource Strain:    Difficulty of Paying Living Expenses: Not on file  Food Insecurity:    Worried About [place] in the Last Year: Not on file   Ran Out of Food in the Last Year: Not on file  Transportation Needs:    Lack of Transportation (Medical): Not on file   Lack of Transportation (Non-Medical): Not on file  Physical Activity:    Days of Exercise per Week: Not on file   Minutes of Exercise per Session: Not on file  Stress:    Feeling of Stress : Not on file  Social Connections:    Frequency of  Communication with Friends and Family: Not on file   Frequency of Social Gatherings with Friends and Family: Not on file   Attends Religious Services: Not on file   Active Member of Clubs or Organizations: Not on file   Attends Archivist Meetings: Not on file   Marital Status: Not on file     FAMILY HISTORY:  We obtained a detailed, 4-generation family history.  Significant diagnoses are listed below: Family History  Problem Relation Age of Onset   Breast cancer Paternal Aunt    Lung cancer Mother    Pancreatic cancer Father    Cancer Paternal Uncle        unk type   Cancer Paternal Aunt        unk type, possibly breast   Breast cancer Cousin    Cancer Cousin        unk type   Barbara [name] has two daughters, Barbara [name], 22 and Barbara [name], 45. Barbara [name] [place]. Patient had 2 sisters and 2 brothers. One sister passed away. A niece had uterine cancer at 9 and reportedly negative genetic testing.  Barbara [name] mother had lung cancer later in life, and she also had whipple surgery but patient is unsure for what type of cancer or why she had the surgery. No other known cancers on this side of the family; patient had 2 maternal uncles, 38 aunts, grandfather died young and grandmother died in her 107s.  Barbara [name] father had pancreatic cancer at 102 and died at 75. Patient had 3 paternal uncles and 5 paternal aunts. An aunt had breast cancer at 14. Another aunt had cancer, unsure type but possibly breast. An uncle had cancer, unknown type. A cousin had breast cancer and had a double mastectomy. Another cousin had cancer, unknown type. Grandfather passed at 76, grandmother passed due to heart issues.   Barbara [name] is aware of previous family history of genetic testing for hereditary cancer risks. Patient's maternal ancestors are of [place] descent, and paternal ancestors are of [place] descent. There is no reported Ashkenazi Jewish ancestry. There is no known  consanguinity.    GENETIC COUNSELING ASSESSMENT: Barbara [name] is a 69 y.o. female with a personal and family history of breast/pancreatic cancer which is somewhat suggestive of a hereditary cancer syndrome and predisposition to cancer. We, therefore, discussed and recommended the following at today's visit.   DISCUSSION: We discussed that approximately 5-10% of breast cancer is hereditary  Most cases of hereditary breast and pancreatic cancer are associated with BRCA1/BRCA2 genes, although there are other genes associated with hereditary cancer as well including. We discussed that testing is beneficial for several reasons including  knowing about other cancer risks,  identifying potential screening and risk-reduction options that may be appropriate, and to understand if other family members could be at risk for cancer and allow them to undergo genetic testing.   We reviewed the characteristics, features and inheritance patterns of hereditary cancer syndromes. We also discussed genetic testing, including the appropriate family members to test, the process of testing, insurance coverage and turn-around-time for results. We discussed the implications of a negative, positive and/or variant of uncertain significant result. We recommended Ms. Sando pursue genetic testing for the [employer] gene panel.   The Multi-Cancer Panel offered by [facility] includes sequencing and/or deletion duplication testing of the following 85 genes: AIP, ALK, APC, ATM, AXIN2,BAP1,  BARD1, BLM, BMPR1A, BRCA1, BRCA2, BRIP1, CASR, CDC73, CDH1, CDK4, CDKN1B, CDKN1C, CDKN2A (p14ARF), CDKN2A (p16INK4a), CEBPA, CHEK2, CTNNA1, DICER1, DIS3L2, EGFR (c.2369C>T, p.Thr790Met variant only), EPCAM (Deletion/duplication testing only), FH, FLCN, GATA2, GPC3, GREM1 (Promoter region deletion/duplication testing only), HOXB13 (c.251G>A, p.Gly84Glu), HRAS, KIT, MAX, MEN1, MET, MITF (c.952G>A, p.Glu318Lys variant only), MLH1, MSH2, MSH3, MSH6,  MUTYH, NBN, NF1, NF2, NTHL1, PALB2, PDGFRA, PHOX2B, PMS2, POLD1, POLE, POT1, PRKAR1A, PTCH1, PTEN, RAD50, RAD51C, RAD51D, RB1, RECQL4, RET, RNF43, RUNX1, SDHAF2, SDHA (sequence changes only), SDHB, SDHC, SDHD, SMAD4, SMARCA4, SMARCB1, SMARCE1, STK11, SUFU, TERC, TERT, TMEM127, TP53, TSC1, TSC2, VHL, WRN and WT1.   Based on Ms. Iafrate's personal and family history of cancer, she meets medical criteria for genetic testing. Despite that she meets criteria, she may still have an out of pocket cost.   PLAN: After considering the risks, benefits, and limitations, Ms. Benard provided informed consent to pursue genetic testing and the blood sample was sent to [facility] for analysis of the Multi-Cancer Panel. Results should be available within approximately 2-3 weeks' time, at which point they will be disclosed by telephone to Ms. Hiraldo, as will any additional recommendations warranted by these results. Ms. Bellemare will receive a summary of her genetic counseling visit and a copy of her results once available. This information will also be available in Epic.    Ms. Conrow questions were answered to her satisfaction today. Our contact information was provided should additional questions or concerns arise. Thank you for the referral and allowing [place] to share in the care of your patient.   Faith Rogue, MS, [facility] Genetic Counselor [email or web address] Phone: [phone]  The patient was seen for a total of 35 minutes in face-to-face genetic counseling. Ms. Goodroe daughter, Arrie Aran, was also present. Dr. Grayland Ormond was available for discussion regarding this case.   _______________________________________________________________________ For Office Staff:  Number of people involved in session: 2 Was an Intern/ student involved with case: no

## 2019-11-20 ENCOUNTER — Inpatient Hospital Stay (HOSPITAL_BASED_OUTPATIENT_CLINIC_OR_DEPARTMENT_OTHER): Payer: Medicare PPO | Admitting: Oncology

## 2019-11-20 ENCOUNTER — Other Ambulatory Visit: Payer: Self-pay

## 2019-11-20 ENCOUNTER — Encounter: Payer: Self-pay | Admitting: Oncology

## 2019-11-20 VITALS — BP 128/79 | HR 83 | Temp 98.3°F | Resp 18 | Wt 207.0 lb

## 2019-11-20 DIAGNOSIS — Z800 Family history of malignant neoplasm of digestive organs: Secondary | ICD-10-CM | POA: Diagnosis not present

## 2019-11-20 DIAGNOSIS — Z803 Family history of malignant neoplasm of breast: Secondary | ICD-10-CM | POA: Diagnosis not present

## 2019-11-20 DIAGNOSIS — K219 Gastro-esophageal reflux disease without esophagitis: Secondary | ICD-10-CM | POA: Diagnosis not present

## 2019-11-20 DIAGNOSIS — Z170 Estrogen receptor positive status [ER+]: Secondary | ICD-10-CM | POA: Diagnosis not present

## 2019-11-20 DIAGNOSIS — Z171 Estrogen receptor negative status [ER-]: Secondary | ICD-10-CM

## 2019-11-20 DIAGNOSIS — Z809 Family history of malignant neoplasm, unspecified: Secondary | ICD-10-CM | POA: Diagnosis not present

## 2019-11-20 DIAGNOSIS — I10 Essential (primary) hypertension: Secondary | ICD-10-CM | POA: Diagnosis not present

## 2019-11-20 DIAGNOSIS — Z9049 Acquired absence of other specified parts of digestive tract: Secondary | ICD-10-CM | POA: Diagnosis not present

## 2019-11-20 DIAGNOSIS — C50211 Malignant neoplasm of upper-inner quadrant of right female breast: Secondary | ICD-10-CM | POA: Diagnosis present

## 2019-11-20 DIAGNOSIS — Z79899 Other long term (current) drug therapy: Secondary | ICD-10-CM | POA: Diagnosis not present

## 2019-11-20 DIAGNOSIS — Z801 Family history of malignant neoplasm of trachea, bronchus and lung: Secondary | ICD-10-CM | POA: Diagnosis not present

## 2019-11-20 NOTE — Progress Notes (Signed)
Hematology/Oncology Consult note Barbara [name] Telephone:([phone] [phone]   Patient Care Team: Barbara [name] as PCP - General (Family Medicine)  REFERRING PROVIDER: [name], [name]  CHIEF COMPLAINTS/REASON FOR VISIT:  Evaluation of breast cancer  HISTORY OF PRESENTING ILLNESS:   Barbara [name] is a  69 y.o.  female with PMH listed below was seen in consultation at the request of  Barbara [name]  for evaluation of breast cancer  10/13/2019 diagnostic mammogram  Showed 46mm spiculated mass in the right breast 1:00. Unchanged left axillary probabaly benign mass- 1 year stability.  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 ER 90% positive, PR 1-0% positive, HER2 negative.  Patient denies any breast skin changes or nipple discharge  Family history of breast cancer: Barbara [name] Family history of other cancers: lung cancer in mother, pancreatic cancer in father, unknown cancer in pat uncle.   Menarche: [age] Menopause: age of 9 Number of pregnancies : 2 Age at first live childbirth:2 Used OCP: remote use of OCP for couple of years Used estrogen and progesterone therapy: denies History of Radiation to the chest: denies Previous of breast biopsy: previous biopsies in [place] is a 69 y.o. female who has above history reviewed by me today presents for follow up visit for management of right breast cancer. Problems and complaints are listed below: 10/30/2019, patient underwent right lumpectomy and sentinel lymph node biopsy.  Today she presents to discuss pathology results and management plan.  Denies any concerns at her surgical sites except some soreness after the surgery.  No fever, chills, pain.  Review of Systems   Constitutional: Negative for appetite change, chills, fatigue and fever.  HENT:   Negative for hearing loss and voice change.   Eyes: Negative for eye problems.  Respiratory: Negative for chest tightness and cough.   Cardiovascular: Negative for chest pain.  Gastrointestinal: Negative for abdominal distention, abdominal pain and blood in stool.  Endocrine: Negative for hot flashes.  Genitourinary: Negative for difficulty urinating and frequency.   Musculoskeletal: Negative for arthralgias.  Skin: Negative for itching and rash.  Neurological: Negative for extremity weakness.  Hematological: Negative for adenopathy.  Psychiatric/Behavioral: Negative for confusion.    MEDICAL HISTORY:  Past Medical History:  Diagnosis Date   [name])    Family history of breast cancer    Family history of lung cancer    Family history of pancreatic cancer    Family history of uterine cancer    GERD (gastroesophageal reflux disease)    Headache    migraines   Hypertension    Pre-diabetes    Sleep apnea    uses cpap    SURGICAL HISTORY: Past Surgical History:  Procedure Laterality Date   APPENDECTOMY     BREAST BIOPSY Right 10/20/2019   Affirm bx-"X" clip path pending   BREAST CYST EXCISION Right    BREAST CYST EXCISION Right    BREAST CYST EXCISION Left    BREAST SURGERY     CHOLECYSTECTOMY     PART MASTECTOMY,RADIO FREQUENCY LOCALIZER,AXILLARY SENTINEL NODE BIOPSY Right 10/30/2019   Procedure: PART MASTECTOMY,RADIO FREQUENCY LOCALIZER,AXILLARY SENTINEL NODE BIOPSY;  Surgeon: Herbert Pun, [name];  Location: [facility] ORS;  Service: General;  Laterality: Right;    SOCIAL HISTORY: Social History  Socioeconomic History   Marital status: Married    Spouse name: Not on file   Number of children: Not on file   Years of education: Not on file   Highest education level: Not on file  Occupational History   Not on file  Tobacco Use   Smoking status: Never  Smoker   Smokeless tobacco: Never Used  Vaping Use   Vaping Use: Never used  Substance and Sexual Activity   Alcohol use: No   Drug use: No   Sexual activity: Not on file  Other Topics Concern   Not on file  Social History Narrative   Not on file   Social Determinants of Health   Financial Resource Strain:    Difficulty of Paying Living Expenses: Not on file  Food Insecurity:    Worried About Running Out of Food in the Last Year: Not on file   Ran Out of Food in the Last Year: Not on file  Transportation Needs:    Lack of Transportation (Medical): Not on file   Lack of Transportation (Non-Medical): Not on file  Physical Activity:    Days of Exercise per Week: Not on file   Minutes of Exercise per Session: Not on file  Stress:    Feeling of Stress : Not on file  Social Connections:    Frequency of Communication with Friends and Family: Not on file   Frequency of Social Gatherings with Friends and Family: Not on file   Attends Religious Services: Not on file   Active Member of Clubs or Organizations: Not on file   Attends Banker Meetings: Not on file   Marital Status: Not on file  Intimate Partner Violence:    Fear of Current or Ex-Partner: Not on file   Emotionally Abused: Not on file   Physically Abused: Not on file   Sexually Abused: Not on file    FAMILY HISTORY: Family History  Problem Relation Age of Onset   Breast cancer Paternal Aunt    Lung cancer Mother    Pancreatic cancer Father    Cancer Paternal Uncle        unk type   Cancer Paternal Aunt        unk type, possibly breast   Breast cancer [name]    Cancer [name]        unk type    ALLERGIES:  has No Known Allergies.  MEDICATIONS:  Current Outpatient Medications  Medication Sig Dispense Refill   albuterol (PROVENTIL HFA;VENTOLIN HFA) 108 (90 Base) MCG/ACT inhaler Inhale 2 puffs into the lungs every 4 (four) hours as needed for wheezing. 1 Inhaler 0    amLODipine (NORVASC) 5 MG tablet Take 5 mg by mouth at bedtime.      aspirin 81 MG EC tablet Take 81 mg by mouth daily.      cetirizine (ZYRTEC) 10 MG tablet Take 10 mg by mouth every morning.      Cholecalciferol (VITAMIN D) 50 MCG (2000 UT) CAPS Take 2,000 Units by mouth daily.     CINNAMON PO Take 1,000 mg by mouth in the morning and at bedtime.      Krill Oil 500 MG CAPS Take 500 mg by mouth daily.     lidocaine-prilocaine (EMLA) cream Apply topically daily.     lovastatin (MEVACOR) 40 MG tablet Take 40 mg by mouth at bedtime.      meloxicam (MOBIC) 15 MG tablet Take 15 mg by mouth daily as needed for pain.  mometasone (ELOCON) 0.1 % cream Apply 1 application topically daily as needed (ear irritation).     pantoprazole (PROTONIX) 40 MG tablet Take 40 mg by mouth every morning.      No current facility-administered medications for this visit.     PHYSICAL EXAMINATION: ECOG PERFORMANCE STATUS: 0 - Asymptomatic Vitals:   11/20/19 1036  BP: 128/79  Pulse: 83  Resp: 18  Temp: 98.3 F (36.8 C)   Filed Weights   11/20/19 1036  Weight: 207 lb (93.9 kg)    Physical Exam Constitutional:      General: She is not in acute distress. HENT:     Head: Normocephalic and atraumatic.  Eyes:     General: No scleral icterus. Cardiovascular:     Rate and Rhythm: Normal rate and regular rhythm.     Heart sounds: Normal heart sounds.  Pulmonary:     Effort: Pulmonary effort is normal. No respiratory distress.     Breath sounds: No wheezing.  Abdominal:     General: Bowel sounds are normal. There is no distension.     Palpations: Abdomen is soft.  Musculoskeletal:        General: No deformity. Normal range of motion.     Cervical back: Normal range of motion and neck supple.  Skin:    General: Skin is warm and dry.     Findings: No erythema or rash.  Neurological:     Mental Status: She is alert and oriented to person, place, and time. Mental status is at baseline.      Cranial Nerves: No cranial nerve deficit.     Coordination: Coordination normal.  Psychiatric:        Mood and Affect: Mood normal.   Right breast status post lumpectomy and sentinel lymph node biopsy.  Local tissue swelling at the lumpectomy site.  No erythema or discharge.  LABORATORY DATA:  I have reviewed the data as listed Lab Results  Component Value Date   WBC 8.3 10/26/2019   HGB 14.0 10/26/2019   HCT 40.9 10/26/2019   MCV 80.8 10/26/2019   PLT 200 10/26/2019   Recent Labs    10/26/19 1425 10/27/19 1217  NA 141 141  K 3.6 3.6  CL 105 105  CO2 27 27  GLUCOSE 125* 157*  BUN 15 13  CREATININE 1.08* 0.87  CALCIUM 9.0 9.2  GFRNONAA 52* >60  GFRAA >60 >60  PROT 7.1  --   ALBUMIN 4.1  --   AST 15  --   ALT 17  --   ALKPHOS 84  --   BILITOT 1.7*  --    Iron/TIBC/Ferritin/ %Sat No results found for: IRON, TIBC, FERRITIN, IRONPCTSAT    RADIOGRAPHIC STUDIES: I have personally reviewed the radiological images as listed and agreed with the findings in the report. NM SENTINEL NODE INJECTION  Result Date: 10/30/2019 CLINICAL DATA:  Right breast cancer. EXAM: NUCLEAR MEDICINE BREAST LYMPHOSCINTIGRAPHY TECHNIQUE: Intradermal injection of radiopharmaceutical was performed at the 12 o'clock, 3 o'clock, 6 o'clock, and 9 o'clock positions around the right nipple. The patient was then sent to the operating room where the sentinel node(s) were identified and removed by the surgeon. RADIOPHARMACEUTICALS:  Total of 0.751 mCi Millipore-filtered Technetium-58m sulfur colloid, divided into 4 aliquots IMPRESSION: Uncomplicated intradermal injection of a total of 0.751 mCi Technetium-25m sulfur colloid for purposes of sentinel node identification. Electronically Signed   By: Kathreen Devoid   On: 10/30/2019 14:21   MM Breast Surgical Specimen  Result  Date: 10/30/2019 CLINICAL DATA:  Status post RF tag localized right breast lumpectomy. EXAM: SPECIMEN RADIOGRAPH OF THE RIGHT BREAST  COMPARISON:  Previous exam(s). FINDINGS: Status post excision of the right breast. The RF tag and X shaped clip are present within the specimen. IMPRESSION: Specimen radiograph of the right breast. Electronically Signed   By: Baird Lyons M.D.   On: 10/30/2019 14:14   MM RT RADIO FREQUENCY TAG LOC MAMMO GUIDE  Result Date: 10/26/2019 CLINICAL DATA:  Tag localization of the patient's known right breast cancer EXAM: NEEDLE LOCALIZATION OF THE RIGHT BREAST WITH MAMMO GUIDANCE COMPARISON:  Previous exams. FINDINGS: Patient presents for needle localization prior to surgery. I met with the patient and we discussed the procedure of needle localization including benefits and alternatives. We discussed the high likelihood of a successful procedure. We discussed the risks of the procedure, including infection, bleeding, tissue injury, and further surgery. Informed, written consent was given. The usual time-out protocol was performed immediately prior to the procedure. Using mammographic guidance, sterile technique, 1% lidocaine and a 7 cm Tag, the site of the patient's known cancer was localized using a medial approach. The images were marked for the surgeon. IMPRESSION: Tag localization of the right breast. No apparent complications. Electronically Signed   By: Gerome Sam III M.D   On: 10/26/2019 16:43      ASSESSMENT & PLAN:  1. Malignant neoplasm of upper-inner quadrant of right breast in female, estrogen receptor negative (HCC)   Cancer Staging Malignant neoplasm of upper-inner quadrant of right breast in female, estrogen receptor positive (HCC) Staging form: Breast, AJCC 8th Edition - Clinical stage from 10/26/2019: Stage IA (cT1b, cN0, cM0, G1, ER+, PR+, HER2-) - Signed by Rickard Patience, [name] on 10/26/2019  #Stage Ia right ER 90%/PR 1-10% positive, HER-2 negative right breast invasive mammary carcinoma, Final pathology was reviewed and discussed with patient. 4 mm pT1a pN0 tubular carcinoma, grade 1. No  need for adjuvant chemotherapy. I will refer patient to establish care with radiation. Also discussed about role of adjuvant endocrine therapy with aromatase inhibitor.  Rationale of aromatase inhibitor and possible side effects were discussed with patient.  I will meet patient again after she finishes her radiation and further discuss the plan of starting aromatase inhibitor. I recommend patient to obtain baseline bone density I recommend patient to take calcium and vitamin D supplementation.  Recommend annual diagnostic mammogram for surveillance.  Family history of pancreatic cancer and breast cancer.  I recommend genetic is pending..  Patient agrees. Her genetic testing is pending..  Orders Placed This Encounter  Procedures   DG Bone Density    Standing Status:   Future    Standing Expiration Date:   11/19/2020    Order Specific Question:   Reason for Exam (SYMPTOM  OR DIAGNOSIS REQUIRED)    Answer:   Breast cancer/initial DEXA prior to AI therapy    Order Specific Question:   Preferred imaging location?    Answer:   [facility]   Ambulatory referral to Radiation Oncology    Referral Priority:   Routine    Referral Type:   Consultation    Referral Reason:   Specialty Services Required    Requested Specialty:   Radiation Oncology    Number of Visits Requested:   1    All questions were answered. The patient knows to call the clinic with any problems questions or concerns.  cc Marina Goodell, [name]    Return of visit: To be  determined.  Depending on her radiation schedule, I plan to see her 2 weeks after  she finishes last radiation treatments.     Earlie Server, MD, PhD Hematology Oncology [facility] at [facility] Pager- [phone] 11/20/2019

## 2019-11-20 NOTE — Progress Notes (Signed)
Pt here for breast surgery post up follow up. Pt reports she has seen surgeon and everything is going "well."

## 2019-11-21 ENCOUNTER — Encounter: Payer: Self-pay | Admitting: Licensed Clinical Social Worker

## 2019-11-21 ENCOUNTER — Telehealth: Payer: Self-pay | Admitting: Licensed Clinical Social Worker

## 2019-11-21 ENCOUNTER — Ambulatory Visit: Payer: Self-pay | Admitting: Licensed Clinical Social Worker

## 2019-11-21 DIAGNOSIS — Z170 Estrogen receptor positive status [ER+]: Secondary | ICD-10-CM

## 2019-11-21 DIAGNOSIS — Z801 Family history of malignant neoplasm of trachea, bronchus and lung: Secondary | ICD-10-CM

## 2019-11-21 DIAGNOSIS — Z1379 Encounter for other screening for genetic and chromosomal anomalies: Secondary | ICD-10-CM | POA: Insufficient documentation

## 2019-11-21 DIAGNOSIS — Z803 Family history of malignant neoplasm of breast: Secondary | ICD-10-CM

## 2019-11-21 DIAGNOSIS — C50211 Malignant neoplasm of upper-inner quadrant of right female breast: Secondary | ICD-10-CM

## 2019-11-21 DIAGNOSIS — Z8049 Family history of malignant neoplasm of other genital organs: Secondary | ICD-10-CM

## 2019-11-21 DIAGNOSIS — Z800 Family history of malignant neoplasm of digestive organs: Secondary | ICD-10-CM

## 2019-11-21 NOTE — Telephone Encounter (Signed)
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 This normal result is reassuring and indicates that it is unlikely Barbara [name]'s cancer is due to a hereditary cause.  It is unlikely that there is an increased risk of another cancer due to a mutation in one of these genes.  However, genetic testing is not perfect, and cannot definitively rule out a hereditary cause.  It will be important for her to keep in contact with genetics to learn if any additional testing may be needed in the future.

## 2019-11-21 NOTE — Progress Notes (Signed)
HPI:  Ms. Angelica was previously seen in the [place] clinic due to a personal and family history of cancer and concerns regarding a hereditary predisposition to cancer. Please refer to our prior cancer genetics clinic note for more information regarding our discussion, assessment and recommendations, at the time. Ms. Shackleford recent genetic test results were disclosed to her, as were recommendations warranted by these results. These results and recommendations are discussed in more detail below.  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 VUS in BRCA2 called c.2980G>A identified on the [facility] Multi-Cancer Panel. The report date is 11/17/2019.  The Multi-Cancer Panel offered by [facility] includes sequencing and/or deletion duplication testing of the following 85 genes: AIP, ALK, APC, ATM, AXIN2,BAP1,  BARD1, BLM, BMPR1A, BRCA1, BRCA2, BRIP1, CASR, CDC73, CDH1, CDK4, CDKN1B, CDKN1C, CDKN2A (p14ARF), CDKN2A (p16INK4a), CEBPA, CHEK2, CTNNA1, DICER1, DIS3L2, EGFR (c.2369C>T, p.Thr790Met variant only), EPCAM (Deletion/duplication testing only), FH, FLCN, GATA2, GPC3, GREM1 (Promoter region deletion/duplication testing only), HOXB13 (c.251G>A, p.Gly84Glu), HRAS, KIT, MAX, MEN1, MET, MITF (c.952G>A, p.Glu318Lys variant only), MLH1, MSH2, MSH3, MSH6, MUTYH, NBN, NF1, NF2, NTHL1, PALB2, PDGFRA, PHOX2B,  PMS2, POLD1, POLE, POT1, PRKAR1A, PTCH1, PTEN, RAD50, RAD51C, RAD51D, RB1, RECQL4, RET, RNF43, RUNX1, SDHAF2, SDHA (sequence changes only), SDHB, SDHC, SDHD, SMAD4, SMARCA4, SMARCB1, SMARCE1, STK11, SUFU, TERC, TERT, TMEM127, TP53, TSC1, TSC2, VHL, WRN and WT1.      FAMILY HISTORY:  We obtained a detailed, 4-generation family history.  Significant diagnoses are listed below: Family History  Problem Relation Age of Onset  . Breast cancer Paternal Aunt   . Lung cancer Mother   . Pancreatic cancer Father   . Cancer Paternal Uncle        unk type  . Cancer Paternal Aunt        unk type, possibly breast  . Breast cancer Cousin   . Cancer Cousin        unk type   Ms. Vila has two daughters, Dawn, 3 and [place], 54. Nira Conn lives out of state. Patient had 2 sisters and 2 brothers. One sister passed away. A niece had uterine cancer at 2 and reportedly negative genetic testing.  Ms. Delancey mother had lung cancer later in life, and she also had whipple surgery but patient is unsure for what type of cancer or why she had the surgery. No other known cancers on this side of the family; patient had 2 maternal uncles, 1 aunts, grandfather died young and grandmother died in her 73s.  Ms. Paradiso father had pancreatic cancer at 33 and died at 86. Patient had 3 paternal uncles and 5 paternal aunts. An aunt had breast cancer at 38. Another aunt had cancer, unsure type but possibly breast. An uncle had cancer, unknown type. A cousin had breast cancer and had a double mastectomy. Another cousin had cancer, unknown type. Grandfather passed at 38, grandmother passed due to heart issues.   Ms. Bieker is  aware of previous family history of genetic testing for hereditary cancer risks. Patient's maternal ancestors are of [place] descent, and paternal ancestors are of [place] descent. There is no reported Ashkenazi Jewish ancestry. There is no known consanguinity.     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  The remainder of testing was negative/normal.  The Multi-Cancer Panel offered by [facility] includes sequencing and/or deletion duplication testing of the following 85 genes: AIP, ALK, APC, ATM, AXIN2,BAP1,  BARD1, BLM, BMPR1A, BRCA1, BRCA2, BRIP1, CASR, CDC73, CDH1, CDK4, CDKN1B, CDKN1C, CDKN2A (p14ARF), CDKN2A (p16INK4a), CEBPA, CHEK2, CTNNA1, DICER1, DIS3L2, EGFR (c.2369C>T, p.Thr790Met variant only), EPCAM (Deletion/duplication testing only), FH, FLCN, GATA2, GPC3, GREM1 (Promoter region deletion/duplication testing only), HOXB13 (c.251G>A, p.Gly84Glu), HRAS, KIT, MAX, MEN1, MET, MITF (c.952G>A, p.Glu318Lys variant only), MLH1, MSH2, MSH3, MSH6, MUTYH, NBN, NF1, NF2, NTHL1, PALB2, PDGFRA, PHOX2B, PMS2, POLD1, POLE, POT1, PRKAR1A, PTCH1, PTEN, RAD50, RAD51C, RAD51D, RB1, RECQL4, RET, RNF43, RUNX1, SDHAF2, SDHA (sequence changes only), SDHB, SDHC, SDHD, SMAD4, SMARCA4, SMARCB1, SMARCE1, STK11, SUFU, TERC, TERT, TMEM127, TP53, TSC1, TSC2, VHL, WRN and WT1.   The test report has been scanned into EPIC and is located under the Molecular Pathology section of the Results Review tab.  A portion of the result report is included below for reference.     We discussed with Ms. Angert that because current genetic testing is not perfect, it is possible there may be a gene mutation in one of these genes that current testing cannot detect, but that chance is small.  We also discussed, that there could be another gene that has not yet been discovered, or that we have not yet tested, that is responsible for the cancer diagnoses in the family. It is also possible there is a hereditary cause for the cancer in the family that Ms. Faux did not inherit and therefore was not identified in her testing.  Therefore, it is important  to remain in touch with cancer genetics in the future so that we can continue to offer Ms. Kettlewell the most up to date genetic testing.   Genetic testing did identify a variant of uncertain significance (VUS) in the BRCA2 gene called c.2980G>A.  At this time, it is unknown if this variant is associated with increased cancer risk or if this is a normal finding, but most variants such as this get reclassified to being inconsequential. It should not be used to make medical management decisions. With time, we suspect the lab will determine the significance of this variant, if any. If we do learn more about it, we will try to contact Ms. Joens to discuss it further. However, it is important to stay in touch with [place] periodically and keep the address and phone number up to date.  ADDITIONAL GENETIC TESTING: We discussed with Ms. Bufano that her genetic testing was fairly extensive.  If there are genes identified to increase cancer risk that can be analyzed in the future, we would be happy to discuss and coordinate this testing at that time.    MUTYH:  MUTYH- Associated Polyposis is an autosomal recessive condition that increases the risk to develop multiple colon/duodenal polyps and increases cancer risk.   We reviewed recessive inheritance and discussed when an individual has two MUTYH pathogenic mutations, this is associated with an increased risk for adenomatous (precancerous)  colon polyps.      Based on current data, the risk for colon cancer/polyps in individuals who are MUTYH heterozygotes (only 1 mutation) is moderately increased at most.   Only one MUTYH pathogenic variant was detected in Ms. Thayer Jew.  While this is reassuring, there is always a small chance that genetic testing did not detect all possible variants, as the technology is constantly evolving.    No other pathogenic variants were identified in any of the other genes analyzed on this panel. This result indicates that it is unlikely Ms.  Torelli's cancer was due to a hereditary cancer predisposition syndrome.  Most cancers happen by chance and this negative test suggests that her cancer may fall into this category.  It is recommended she continue to follow the cancer management and screening guidelines provided by his oncology and primary healthcare providers. Other factors such as her personal and family history may still affect his cancer risk.  An individual's cancer risk and medical management are not determined by genetic test results alone. Overall cancer risk assessment incorporates additional factors, including personal medical history, family history, and any available genetic information that may result in a personalized plan for cancer prevention and surveillance.  CANCER SCREENING RECOMMENDATIONS:   We recommended Ms. Ekdahl follow management guidelines for heterozygous MUTYH mutations; all of which are outlined below. These are from the NCCN 1.2020 guidelines, and are subject to change.    MUTYH heterozygote with no personal history of colon cancer and No family history of colon cancer: Data are uncertain if specialized screening is warranted.   It is important for [place] to follow general population colon screening guidelines, and any recommendations provided to him by his GI or other healthcare providers.     RECOMMENDATIONS FOR FAMILY MEMBERS:   We recommend all of Krawczyk's family members have genetic counseling and genetic testing. Because the MUTYH pathogenic variant has been identified we can offer genetic testing to relatives to determine if they also carry the MUTYH familial mutation.  As discussed above, having only 1 MUTYH mutation would not significantly impact their colon cancer risk/screening.  However, if any relatives have inherited 2 MUTYH mutations (in trans), this would significantly impact their cancer risk.  About 1-2% of the [place] population carries a single MUTYH pathogenic variant, so there  is a chance some relatives may have inherited MUTYH mutations from both of their parents.  We would be happy to meet with any of the family members or refer them to a genetic counselor in their local area.  To locate genetic counselors in other cities, individuals can visit the website of the [employer] ([email or web address]) and Secretary/administrator for a Social worker by zip code. Ms. Hemmelgarn daughters, Arrie Aran and Nira Conn, are both interested in testing and will contact me to set up an appointment.    Relatives in this family might be at some increased risk of developing cancer, over the general population risk, simply due to the family history of cancer.  We recommended women in this family have a yearly mammogram beginning at age 29, or 81 years younger than the earliest onset of cancer, an annual clinical breast exam, and perform monthly breast self-exams. Women in this family should also have a gynecological exam as recommended by their primary provider. All family members should have a colonoscopy by age 31 (or as directed by their 36).  All family members should inform their physicians about the family history of cancer so their doctors can  make the most appropriate screening recommendations for them.   It is also possible there is a hereditary cause for the cancer in Ms. Lillo's family that she did not inherit and therefore was not identified in her.   We recommended paternal relatives have genetic counseling and testing. Ms. Kuehnle will let us know if we can be of any assistance in coordinating genetic counseling and/or testing for these family members.    FOLLOW-UP: Lastly, we discussed with Ms. Spayd that cancer genetics is a rapidly advancing field and it is possible that new genetic tests will be appropriate for her and/or her family members in the future. We encouraged her to remain in contact with cancer genetics on an annual basis so we can update her personal and family histories  and let her know of advances in cancer genetics that may benefit this family.   Our contact number was provided. Ms. Mancillas questions were answered to her satisfaction, and she knows she is welcome to call us at anytime with additional questions or concerns.   Faith Rogue, MS, [facility] Genetic Counselor [email or web address] Phone: [phone]

## 2019-11-28 DIAGNOSIS — R7301 Impaired fasting glucose: Secondary | ICD-10-CM | POA: Insufficient documentation

## 2019-11-28 DIAGNOSIS — I10 Essential (primary) hypertension: Secondary | ICD-10-CM | POA: Insufficient documentation

## 2019-11-28 DIAGNOSIS — E785 Hyperlipidemia, unspecified: Secondary | ICD-10-CM | POA: Insufficient documentation

## 2019-11-28 DIAGNOSIS — K219 Gastro-esophageal reflux disease without esophagitis: Secondary | ICD-10-CM | POA: Insufficient documentation

## 2019-11-29 ENCOUNTER — Ambulatory Visit
Admission: RE | Admit: 2019-11-29 | Discharge: 2019-11-29 | Disposition: A | Discharge status: Home / Self Care | Payer: Medicare PPO | Source: Ambulatory Visit | Attending: Radiation Oncology | Admitting: Radiation Oncology

## 2019-11-29 ENCOUNTER — Encounter: Payer: Self-pay | Admitting: Radiation Oncology

## 2019-11-29 ENCOUNTER — Other Ambulatory Visit: Payer: Self-pay

## 2019-11-29 VITALS — BP 125/66 | HR 84 | Temp 98.0°F | Wt 205.0 lb

## 2019-11-29 DIAGNOSIS — Z170 Estrogen receptor positive status [ER+]: Secondary | ICD-10-CM

## 2019-11-29 DIAGNOSIS — C50211 Malignant neoplasm of upper-inner quadrant of right female breast: Secondary | ICD-10-CM

## 2019-11-29 NOTE — Consult Note (Signed)
NEW PATIENT EVALUATION  Name: Barbara [name]  MRN: [id number]  Date:   11/29/2019     DOB: February 24, 1950   This 69 y.o. 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  REFERRING PHYSICIAN: [name], MD  CHIEF COMPLAINT:  Chief Complaint  Patient presents with  . Breast Cancer    DIAGNOSIS: The encounter diagnosis was Malignant neoplasm of upper-inner quadrant of right breast in female, estrogen receptor positive ([place]).    PREVIOUS INVESTIGATIONS:  Mammogram and ultrasound reviewed Pathology report reviewed Clinical notes reviewed  HPI: Patient is a 69 year old female who presented back in September with a 5 mm spiculated lesion on her diagnostic mammograms in the right breast at the 1 o'clock position.  Axilla was unremarkable.  She underwent a needle guided biopsy which was positive for well-differentiated grade 1 tubular carcinoma.  Tumor was ER positive PR borderline positive HER-2/neu negative.  She underwent a wide local excision and sentinel node biopsy.  Tumor was 4 mm overall grade 1.  Margins were clear at 7 mm.  1 sentinel and 1 nonsentinel lymph node were examined negative for metastatic disease.  She has been seen by medical oncology and is now referred to ration collagen for consideration of treatment.  She is doing well she is healing well slight breast tenderness is noted no cough or bone pain.  PLANNED TREATMENT REGIMEN: Right hypofractionated whole breast radiation  PAST MEDICAL HISTORY:  has a past medical history of Cancer ([place]), Family history of breast cancer, Family history of lung cancer, Family history of pancreatic cancer, Family history of uterine cancer, GERD (gastroesophageal reflux disease), Headache, Hypertension, Pre-diabetes, and Sleep apnea.    PAST SURGICAL HISTORY:  Past Surgical  History:  Procedure Laterality Date  . APPENDECTOMY    . BREAST BIOPSY Right 10/20/2019   Affirm bx-"X" clip path pending  . BREAST CYST EXCISION Right   . BREAST CYST EXCISION Right   . BREAST CYST EXCISION Left   . BREAST SURGERY    . CHOLECYSTECTOMY    . PART MASTECTOMY,RADIO FREQUENCY LOCALIZER,AXILLARY SENTINEL NODE BIOPSY Right 10/30/2019   Procedure: PART MASTECTOMY,RADIO FREQUENCY LOCALIZER,AXILLARY SENTINEL NODE BIOPSY;  Surgeon: Herbert Pun, MD;  Location: [facility] ORS;  Service: General;  Laterality: Right;    FAMILY HISTORY: family history includes Breast cancer in her cousin and paternal aunt; Cancer in her cousin, paternal aunt, and paternal uncle; Lung cancer in her mother; Pancreatic cancer in her father.  SOCIAL HISTORY:  reports that she has never smoked. She has never used smokeless tobacco. She reports that she does not drink alcohol and does not use drugs.  ALLERGIES: Patient has no known allergies.  MEDICATIONS:  Current Outpatient Medications  Medication Sig Dispense Refill  . albuterol (PROVENTIL HFA;VENTOLIN HFA) 108 (90 Base) MCG/ACT inhaler Inhale 2 puffs into the lungs every 4 (four) hours as needed for wheezing. 1 Inhaler 0  . amLODipine (NORVASC) 5 MG tablet Take 5 mg by mouth at bedtime.     Marland Kitchen aspirin 81 MG EC tablet Take 81 mg by mouth daily.     . cetirizine (ZYRTEC) 10 MG tablet Take 10 mg by mouth every morning.     . Cholecalciferol (VITAMIN D) 50 MCG (2000 UT) CAPS Take 2,000 Units by mouth daily.    Marland Kitchen CINNAMON PO Take 1,000 mg by mouth in the morning  and at bedtime.     Javier Docker Oil 500 MG CAPS Take 500 mg by mouth daily.    Marland Kitchen lidocaine-prilocaine (EMLA) cream Apply topically daily.    Marland Kitchen lovastatin (MEVACOR) 40 MG tablet Take 40 mg by mouth at bedtime.     . meloxicam (MOBIC) 15 MG tablet Take 15 mg by mouth daily as needed for pain.    . mometasone (ELOCON) 0.1 % cream Apply 1 application topically daily as needed (ear irritation).    .  pantoprazole (PROTONIX) 40 MG tablet Take 40 mg by mouth every morning.      No current facility-administered medications for this encounter.    ECOG PERFORMANCE STATUS:  0 - Asymptomatic  REVIEW OF SYSTEMS: Patient denies any weight loss, fatigue, weakness, fever, chills or night sweats. Patient denies any loss of vision, blurred vision. Patient denies any ringing  of the ears or hearing loss. No irregular heartbeat. Patient denies heart murmur or history of fainting. Patient denies any chest pain or pain radiating to her upper extremities. Patient denies any shortness of breath, difficulty breathing at night, cough or hemoptysis. Patient denies any swelling in the lower legs. Patient denies any nausea vomiting, vomiting of blood, or coffee ground material in the vomitus. Patient denies any stomach pain. Patient states has had normal bowel movements no significant constipation or diarrhea. Patient denies any dysuria, hematuria or significant nocturia. Patient denies any problems walking, swelling in the joints or loss of balance. Patient denies any skin changes, loss of hair or loss of weight. Patient denies any excessive worrying or anxiety or significant depression. Patient denies any problems with insomnia. Patient denies excessive thirst, polyuria, polydipsia. Patient denies any swollen glands, patient denies easy bruising or easy bleeding. Patient denies any recent infections, allergies or URI. Patient "s visual fields have not changed significantly in recent time.   PHYSICAL EXAM: BP 125/66 (BP Location: Left Arm, Patient Position: Sitting, Cuff Size: Normal)   Pulse 84   Temp 98 F (36.7 C) (Tympanic)   Wt 205 lb (93 kg)   BMI 34.11 kg/m  Patient status post wide local excision the right breast appears to be a seroma present although incision is well-healed.  No dominant mass other than seroma is noted in either breast in 2 positions examined.  No axillary or supraclavicular adenopathy is  noted.  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  I also boost her scar another 10 Gray using electron beam.  Risks and benefits of treatment including skin reaction fatigue alteration blood counts possible inclusion of superficial lung all were described in detail to the patient and her husband.  Both seem to comprehend my treatment plan well.  I have personally set up and ordered CT simulation in about 2 weeks time should she is on vacation next week.  Patient also will be a candidate for antiestrogen therapy after completion of radiation.  I would like to take this opportunity to thank you for allowing me to participate in the care of your patient.Noreene Filbert, MD

## 2019-12-04 ENCOUNTER — Ambulatory Visit: Payer: Medicare PPO

## 2019-12-11 ENCOUNTER — Ambulatory Visit: Payer: Medicare PPO

## 2019-12-11 DIAGNOSIS — C50211 Malignant neoplasm of upper-inner quadrant of right female breast: Secondary | ICD-10-CM | POA: Insufficient documentation

## 2019-12-11 DIAGNOSIS — Z170 Estrogen receptor positive status [ER+]: Secondary | ICD-10-CM | POA: Insufficient documentation

## 2019-12-11 DIAGNOSIS — Z510 Encounter for antineoplastic radiation therapy: Secondary | ICD-10-CM | POA: Insufficient documentation

## 2019-12-12 DIAGNOSIS — Z510 Encounter for antineoplastic radiation therapy: Secondary | ICD-10-CM | POA: Diagnosis not present

## 2019-12-15 ENCOUNTER — Other Ambulatory Visit: Payer: Self-pay | Admitting: *Deleted

## 2019-12-15 DIAGNOSIS — Z170 Estrogen receptor positive status [ER+]: Secondary | ICD-10-CM

## 2019-12-18 ENCOUNTER — Ambulatory Visit: Admission: RE | Admit: 2019-12-18 | Payer: Medicare PPO | Source: Ambulatory Visit

## 2019-12-18 DIAGNOSIS — Z510 Encounter for antineoplastic radiation therapy: Secondary | ICD-10-CM | POA: Diagnosis not present

## 2019-12-19 ENCOUNTER — Ambulatory Visit
Admission: RE | Admit: 2019-12-19 | Discharge: 2019-12-19 | Disposition: A | Discharge status: Home / Self Care | Payer: Medicare PPO | Source: Ambulatory Visit | Attending: Radiation Oncology | Admitting: Radiation Oncology

## 2019-12-19 DIAGNOSIS — Z510 Encounter for antineoplastic radiation therapy: Secondary | ICD-10-CM | POA: Diagnosis not present

## 2019-12-20 ENCOUNTER — Ambulatory Visit
Admission: RE | Admit: 2019-12-20 | Discharge: 2019-12-20 | Disposition: A | Discharge status: Home / Self Care | Payer: Medicare PPO | Source: Ambulatory Visit | Attending: Radiation Oncology | Admitting: Radiation Oncology

## 2019-12-20 DIAGNOSIS — Z510 Encounter for antineoplastic radiation therapy: Secondary | ICD-10-CM | POA: Diagnosis not present

## 2019-12-21 ENCOUNTER — Ambulatory Visit
Admission: RE | Admit: 2019-12-21 | Discharge: 2019-12-21 | Disposition: A | Discharge status: Home / Self Care | Payer: Medicare PPO | Source: Ambulatory Visit | Attending: Radiation Oncology | Admitting: Radiation Oncology

## 2019-12-21 DIAGNOSIS — Z510 Encounter for antineoplastic radiation therapy: Secondary | ICD-10-CM | POA: Diagnosis not present

## 2019-12-22 ENCOUNTER — Ambulatory Visit
Admission: RE | Admit: 2019-12-22 | Discharge: 2019-12-22 | Disposition: A | Discharge status: Home / Self Care | Payer: Medicare PPO | Source: Ambulatory Visit | Attending: Radiation Oncology | Admitting: Radiation Oncology

## 2019-12-22 DIAGNOSIS — Z510 Encounter for antineoplastic radiation therapy: Secondary | ICD-10-CM | POA: Diagnosis not present

## 2019-12-25 ENCOUNTER — Ambulatory Visit
Admission: RE | Admit: 2019-12-25 | Discharge: 2019-12-25 | Disposition: A | Discharge status: Home / Self Care | Payer: Medicare PPO | Source: Ambulatory Visit | Attending: Radiation Oncology | Admitting: Radiation Oncology

## 2019-12-25 DIAGNOSIS — Z510 Encounter for antineoplastic radiation therapy: Secondary | ICD-10-CM | POA: Diagnosis not present

## 2019-12-26 ENCOUNTER — Ambulatory Visit
Admission: RE | Admit: 2019-12-26 | Discharge: 2019-12-26 | Disposition: A | Discharge status: Home / Self Care | Payer: Medicare PPO | Source: Ambulatory Visit | Attending: Radiation Oncology | Admitting: Radiation Oncology

## 2019-12-26 DIAGNOSIS — Z510 Encounter for antineoplastic radiation therapy: Secondary | ICD-10-CM | POA: Diagnosis not present

## 2019-12-27 ENCOUNTER — Other Ambulatory Visit: Payer: Self-pay

## 2019-12-27 ENCOUNTER — Ambulatory Visit
Admission: RE | Admit: 2019-12-27 | Discharge: 2019-12-27 | Disposition: A | Discharge status: Home / Self Care | Payer: Medicare PPO | Source: Ambulatory Visit | Attending: Radiation Oncology | Admitting: Radiation Oncology

## 2019-12-27 ENCOUNTER — Ambulatory Visit
Admission: RE | Admit: 2019-12-27 | Discharge: 2019-12-27 | Disposition: A | Discharge status: Home / Self Care | Payer: Medicare PPO | Source: Ambulatory Visit | Attending: Oncology | Admitting: Oncology

## 2019-12-27 DIAGNOSIS — Z510 Encounter for antineoplastic radiation therapy: Secondary | ICD-10-CM | POA: Diagnosis not present

## 2019-12-27 DIAGNOSIS — Z13820 Encounter for screening for osteoporosis: Secondary | ICD-10-CM | POA: Insufficient documentation

## 2019-12-27 DIAGNOSIS — Z171 Estrogen receptor negative status [ER-]: Secondary | ICD-10-CM | POA: Diagnosis present

## 2019-12-27 DIAGNOSIS — M85851 Other specified disorders of bone density and structure, right thigh: Secondary | ICD-10-CM | POA: Insufficient documentation

## 2019-12-27 DIAGNOSIS — C50211 Malignant neoplasm of upper-inner quadrant of right female breast: Secondary | ICD-10-CM | POA: Insufficient documentation

## 2019-12-27 DIAGNOSIS — Z923 Personal history of irradiation: Secondary | ICD-10-CM | POA: Insufficient documentation

## 2020-01-01 ENCOUNTER — Ambulatory Visit
Admission: RE | Admit: 2020-01-01 | Discharge: 2020-01-01 | Disposition: A | Discharge status: Home / Self Care | Payer: Medicare PPO | Source: Ambulatory Visit | Attending: Radiation Oncology | Admitting: Radiation Oncology

## 2020-01-01 DIAGNOSIS — Z510 Encounter for antineoplastic radiation therapy: Secondary | ICD-10-CM | POA: Diagnosis not present

## 2020-01-02 ENCOUNTER — Ambulatory Visit
Admission: RE | Admit: 2020-01-02 | Discharge: 2020-01-02 | Disposition: A | Discharge status: Home / Self Care | Payer: Medicare PPO | Source: Ambulatory Visit | Attending: Radiation Oncology | Admitting: Radiation Oncology

## 2020-01-02 DIAGNOSIS — Z510 Encounter for antineoplastic radiation therapy: Secondary | ICD-10-CM | POA: Diagnosis not present

## 2020-01-03 ENCOUNTER — Inpatient Hospital Stay: Payer: Medicare PPO | Attending: Radiation Oncology

## 2020-01-03 ENCOUNTER — Other Ambulatory Visit: Payer: Self-pay

## 2020-01-03 ENCOUNTER — Ambulatory Visit
Admission: RE | Admit: 2020-01-03 | Discharge: 2020-01-03 | Disposition: A | Discharge status: Home / Self Care | Payer: Medicare PPO | Source: Ambulatory Visit | Attending: Radiation Oncology | Admitting: Radiation Oncology

## 2020-01-03 DIAGNOSIS — C50211 Malignant neoplasm of upper-inner quadrant of right female breast: Secondary | ICD-10-CM | POA: Insufficient documentation

## 2020-01-03 DIAGNOSIS — Z170 Estrogen receptor positive status [ER+]: Secondary | ICD-10-CM | POA: Insufficient documentation

## 2020-01-03 DIAGNOSIS — Z510 Encounter for antineoplastic radiation therapy: Secondary | ICD-10-CM | POA: Diagnosis present

## 2020-01-03 LAB — CBC
HCT: 42.4 % (ref 36.0–46.0)
Hemoglobin: 13.7 g/dL (ref 12.0–15.0)
MCH: 26.7 pg (ref 26.0–34.0)
MCHC: 32.3 g/dL (ref 30.0–36.0)
MCV: 82.5 fL (ref 80.0–100.0)
Platelets: 193 10*3/uL (ref 150–400)
RBC: 5.14 MIL/uL — ABNORMAL HIGH (ref 3.87–5.11)
RDW: 14.3 % (ref 11.5–15.5)
WBC: 6.3 10*3/uL (ref 4.0–10.5)
nRBC: 0 % (ref 0.0–0.2)

## 2020-01-04 ENCOUNTER — Ambulatory Visit
Admission: RE | Admit: 2020-01-04 | Discharge: 2020-01-04 | Disposition: A | Discharge status: Home / Self Care | Payer: Medicare PPO | Source: Ambulatory Visit | Attending: Radiation Oncology | Admitting: Radiation Oncology

## 2020-01-04 ENCOUNTER — Telehealth: Payer: Self-pay

## 2020-01-04 DIAGNOSIS — Z510 Encounter for antineoplastic radiation therapy: Secondary | ICD-10-CM | POA: Diagnosis not present

## 2020-01-04 NOTE — Telephone Encounter (Signed)
-----   Message from Earlie Server, MD sent at 01/03/2020 10:07 PM EST ----- Pls schedule her to see me MD only in early Jan. Thanks.

## 2020-01-04 NOTE — Telephone Encounter (Signed)
Done... Pt has been sched to RTC in Jan of 2022 for a MD Visit Only A detailed message was left on her VM making her aware of the  sched 02/09/19 date and time. A NEW appt reminder letter will be mail out.

## 2020-01-04 NOTE — Telephone Encounter (Addendum)
Patient notified via [place].  Please schedule her to see MD in early January and let her know about appt details (prefers a phone call with appts).

## 2020-01-05 ENCOUNTER — Ambulatory Visit
Admission: RE | Admit: 2020-01-05 | Discharge: 2020-01-05 | Disposition: A | Discharge status: Home / Self Care | Payer: Medicare PPO | Source: Ambulatory Visit | Attending: Radiation Oncology | Admitting: Radiation Oncology

## 2020-01-05 DIAGNOSIS — Z510 Encounter for antineoplastic radiation therapy: Secondary | ICD-10-CM | POA: Diagnosis not present

## 2020-01-08 ENCOUNTER — Ambulatory Visit
Admission: RE | Admit: 2020-01-08 | Discharge: 2020-01-08 | Disposition: A | Discharge status: Home / Self Care | Payer: Medicare PPO | Source: Ambulatory Visit | Attending: Radiation Oncology | Admitting: Radiation Oncology

## 2020-01-08 DIAGNOSIS — Z510 Encounter for antineoplastic radiation therapy: Secondary | ICD-10-CM | POA: Diagnosis not present

## 2020-01-09 ENCOUNTER — Ambulatory Visit
Admission: RE | Admit: 2020-01-09 | Discharge: 2020-01-09 | Disposition: A | Discharge status: Home / Self Care | Payer: Medicare PPO | Source: Ambulatory Visit | Attending: Radiation Oncology | Admitting: Radiation Oncology

## 2020-01-09 DIAGNOSIS — Z510 Encounter for antineoplastic radiation therapy: Secondary | ICD-10-CM | POA: Diagnosis not present

## 2020-01-10 ENCOUNTER — Ambulatory Visit
Admission: RE | Admit: 2020-01-10 | Discharge: 2020-01-10 | Disposition: A | Discharge status: Home / Self Care | Payer: Medicare PPO | Source: Ambulatory Visit | Attending: Radiation Oncology | Admitting: Radiation Oncology

## 2020-01-10 DIAGNOSIS — Z510 Encounter for antineoplastic radiation therapy: Secondary | ICD-10-CM | POA: Diagnosis not present

## 2020-01-11 ENCOUNTER — Ambulatory Visit
Admission: RE | Admit: 2020-01-11 | Discharge: 2020-01-11 | Disposition: A | Discharge status: Home / Self Care | Payer: Medicare PPO | Source: Ambulatory Visit | Attending: Radiation Oncology | Admitting: Radiation Oncology

## 2020-01-11 DIAGNOSIS — Z510 Encounter for antineoplastic radiation therapy: Secondary | ICD-10-CM | POA: Diagnosis not present

## 2020-01-12 ENCOUNTER — Ambulatory Visit
Admission: RE | Admit: 2020-01-12 | Discharge: 2020-01-12 | Disposition: A | Discharge status: Home / Self Care | Payer: Medicare PPO | Source: Ambulatory Visit | Attending: Radiation Oncology | Admitting: Radiation Oncology

## 2020-01-12 DIAGNOSIS — Z510 Encounter for antineoplastic radiation therapy: Secondary | ICD-10-CM | POA: Diagnosis not present

## 2020-01-15 ENCOUNTER — Ambulatory Visit
Admission: RE | Admit: 2020-01-15 | Discharge: 2020-01-15 | Disposition: A | Discharge status: Home / Self Care | Payer: Medicare PPO | Source: Ambulatory Visit | Attending: Radiation Oncology | Admitting: Radiation Oncology

## 2020-01-15 DIAGNOSIS — Z510 Encounter for antineoplastic radiation therapy: Secondary | ICD-10-CM | POA: Diagnosis not present

## 2020-01-16 ENCOUNTER — Ambulatory Visit
Admission: RE | Admit: 2020-01-16 | Discharge: 2020-01-16 | Disposition: A | Discharge status: Home / Self Care | Payer: Medicare PPO | Source: Ambulatory Visit | Attending: Radiation Oncology | Admitting: Radiation Oncology

## 2020-01-16 DIAGNOSIS — Z510 Encounter for antineoplastic radiation therapy: Secondary | ICD-10-CM | POA: Diagnosis not present

## 2020-01-17 ENCOUNTER — Ambulatory Visit
Admission: RE | Admit: 2020-01-17 | Discharge: 2020-01-17 | Disposition: A | Discharge status: Home / Self Care | Payer: Medicare PPO | Source: Ambulatory Visit | Attending: Radiation Oncology | Admitting: Radiation Oncology

## 2020-01-17 DIAGNOSIS — Z510 Encounter for antineoplastic radiation therapy: Secondary | ICD-10-CM | POA: Diagnosis not present

## 2020-01-18 ENCOUNTER — Ambulatory Visit
Admission: RE | Admit: 2020-01-18 | Discharge: 2020-01-18 | Disposition: A | Discharge status: Home / Self Care | Payer: Medicare PPO | Source: Ambulatory Visit | Attending: Radiation Oncology | Admitting: Radiation Oncology

## 2020-01-18 DIAGNOSIS — Z510 Encounter for antineoplastic radiation therapy: Secondary | ICD-10-CM | POA: Diagnosis not present

## 2020-02-09 ENCOUNTER — Encounter: Payer: Self-pay | Admitting: Oncology

## 2020-02-09 ENCOUNTER — Inpatient Hospital Stay: Payer: Medicare PPO | Attending: Oncology | Admitting: Oncology

## 2020-02-09 VITALS — BP 123/74 | HR 76 | Temp 97.6°F | Resp 16 | Wt 206.4 lb

## 2020-02-09 DIAGNOSIS — Z9049 Acquired absence of other specified parts of digestive tract: Secondary | ICD-10-CM | POA: Insufficient documentation

## 2020-02-09 DIAGNOSIS — C50211 Malignant neoplasm of upper-inner quadrant of right female breast: Secondary | ICD-10-CM | POA: Insufficient documentation

## 2020-02-09 DIAGNOSIS — Z801 Family history of malignant neoplasm of trachea, bronchus and lung: Secondary | ICD-10-CM | POA: Diagnosis not present

## 2020-02-09 DIAGNOSIS — K219 Gastro-esophageal reflux disease without esophagitis: Secondary | ICD-10-CM | POA: Diagnosis not present

## 2020-02-09 DIAGNOSIS — M8580 Other specified disorders of bone density and structure, unspecified site: Secondary | ICD-10-CM | POA: Diagnosis not present

## 2020-02-09 DIAGNOSIS — I10 Essential (primary) hypertension: Secondary | ICD-10-CM | POA: Insufficient documentation

## 2020-02-09 DIAGNOSIS — Z809 Family history of malignant neoplasm, unspecified: Secondary | ICD-10-CM

## 2020-02-09 DIAGNOSIS — Z800 Family history of malignant neoplasm of digestive organs: Secondary | ICD-10-CM | POA: Diagnosis not present

## 2020-02-09 DIAGNOSIS — Z170 Estrogen receptor positive status [ER+]: Secondary | ICD-10-CM | POA: Diagnosis not present

## 2020-02-09 DIAGNOSIS — N6091 Unspecified benign mammary dysplasia of right breast: Secondary | ICD-10-CM | POA: Diagnosis not present

## 2020-02-09 DIAGNOSIS — Z79899 Other long term (current) drug therapy: Secondary | ICD-10-CM | POA: Insufficient documentation

## 2020-02-09 DIAGNOSIS — Z8049 Family history of malignant neoplasm of other genital organs: Secondary | ICD-10-CM | POA: Insufficient documentation

## 2020-02-09 DIAGNOSIS — Z803 Family history of malignant neoplasm of breast: Secondary | ICD-10-CM | POA: Diagnosis not present

## 2020-02-09 DIAGNOSIS — Z923 Personal history of irradiation: Secondary | ICD-10-CM | POA: Diagnosis not present

## 2020-02-09 MED ORDER — ANASTROZOLE 1 MG PO TABS: 1.0000 mg | ORAL_TABLET | Freq: Every day | ORAL | 2 refills | Status: DC

## 2020-02-09 NOTE — Progress Notes (Signed)
Patient denies new problems/concerns today.   °

## 2020-02-09 NOTE — Progress Notes (Signed)
Hematology/Oncology Consult note [facility] Telephone:([phone] [phone]   Patient Care Team: [name], MD as PCP - General (Family Medicine)  REFERRING PROVIDER: [name], MD  CHIEF COMPLAINTS/REASON FOR VISIT:  Evaluation of breast cancer  HISTORY OF PRESENTING ILLNESS:   Barbara [name] is a  70 y.o.  female with PMH listed below was seen in consultation at the request of  [name], MD  for evaluation of breast cancer  10/13/2019 diagnostic mammogram  Showed 71mm spiculated mass in the right breast 1:00. Unchanged left axillary probabaly benign mass- 1 year stability.  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 ER 90% positive, PR 1-0% positive, HER2 negative.  Patient denies any breast skin changes or nipple discharge  Family history of breast cancer: Ethlyn Daniels, and Fraser Din first cousin Family history of other cancers: lung cancer in mother, pancreatic cancer in father, unknown cancer in pat uncle.   Menarche: 58 or 80 Menopause: age of 42 Number of pregnancies : 2 Age at first live childbirth:2 Used OCP: remote use of OCP for couple of years Used estrogen and progesterone therapy: denies History of Radiation to the chest: denies Previous of breast biopsy: previous biopsies in 1970s  # 10/30/2019, patient underwent right lumpectomy and sentinel lymph node biopsy.  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 4 mm pT1a pN0 tubular carcinoma, grade 1. #01/18/2020, finished adjuvant radiation.  INTERVAL HISTORY Barbara [name] is a 70 y.o. female who has above history reviewed by me today presents for follow up visit for management of right breast  cancer. Problems and complaints are listed below: Status post radiation.  She tolerates well. Today she has no new concerns of her breast.  Presents to discuss about antiestrogen treatment.  Review of Systems  Constitutional: Negative for appetite change, chills, fatigue and fever.  HENT:   Negative for hearing loss and voice change.   Eyes: Negative for eye problems.  Respiratory: Negative for chest tightness and cough.   Cardiovascular: Negative for chest pain.  Gastrointestinal: Negative for abdominal distention, abdominal pain and blood in stool.  Endocrine: Negative for hot flashes.  Genitourinary: Negative for difficulty urinating and frequency.   Musculoskeletal: Negative for arthralgias.  Skin: Negative for itching and rash.  Neurological: Negative for extremity weakness.  Hematological: Negative for adenopathy.  Psychiatric/Behavioral: Negative for confusion.    MEDICAL HISTORY:  Past Medical History:  Diagnosis Date  . Cancer ([place])   . Family history of breast cancer   . Family history of lung cancer   . Family history of pancreatic cancer   . Family history of uterine cancer   . GERD (gastroesophageal reflux disease)   . Headache    migraines  . Hypertension   . Pre-diabetes   . Sleep apnea    uses cpap    SURGICAL HISTORY: Past Surgical History:  Procedure Laterality Date  . APPENDECTOMY    . BREAST BIOPSY Right 10/20/2019   Affirm bx-"X" clip path pending  . BREAST CYST EXCISION Right   . BREAST CYST EXCISION Right   . BREAST CYST EXCISION Left   . BREAST SURGERY    . CHOLECYSTECTOMY    . PART MASTECTOMY,RADIO FREQUENCY LOCALIZER,AXILLARY SENTINEL NODE BIOPSY Right 10/30/2019   Procedure:  PART MASTECTOMY,RADIO FREQUENCY LOCALIZER,AXILLARY SENTINEL NODE BIOPSY;  Surgeon: Herbert Pun, MD;  Location: [facility] ORS;  Service: General;  Laterality: Right;    SOCIAL HISTORY: Social History   Socioeconomic History  . Marital status: Married     Spouse name: Not on file  . Number of children: Not on file  . Years of education: Not on file  . Highest education level: Not on file  Occupational History  . Not on file  Tobacco Use  . Smoking status: Never Smoker  . Smokeless tobacco: Never Used  Vaping Use  . Vaping Use: Never used  Substance and Sexual Activity  . Alcohol use: No  . Drug use: No  . Sexual activity: Not on file  Other Topics Concern  . Not on file  Social History Narrative  . Not on file   Social Determinants of Health   Financial Resource Strain: Not on file  Food Insecurity: Not on file  Transportation Needs: Not on file  Physical Activity: Not on file  Stress: Not on file  Social Connections: Not on file  Intimate Partner Violence: Not on file    FAMILY HISTORY: Family History  Problem Relation Age of Onset  . Breast cancer Paternal Aunt   . Lung cancer Mother   . Pancreatic cancer Father   . Cancer Paternal Uncle        unk type  . Cancer Paternal Aunt        unk type, possibly breast  . Breast cancer Cousin   . Cancer Cousin        unk type    ALLERGIES:  has No Known Allergies.  MEDICATIONS:  Current Outpatient Medications  Medication Sig Dispense Refill  . amLODipine (NORVASC) 5 MG tablet Take 5 mg by mouth at bedtime.     Marland Kitchen anastrozole (ARIMIDEX) 1 MG tablet Take 1 tablet (1 mg total) by mouth daily. 30 tablet 2  . aspirin 81 MG EC tablet Take 81 mg by mouth daily.     Marland Kitchen azelastine (ASTELIN) 0.1 % nasal spray Place into the nose.    . cetirizine (ZYRTEC) 10 MG tablet Take 10 mg by mouth every morning.     . Cholecalciferol (VITAMIN D) 50 MCG (2000 UT) CAPS Take 2,000 Units by mouth daily.    Marland Kitchen CINNAMON PO Take 1,000 mg by mouth in the morning and at bedtime.     Javier Docker Oil 500 MG CAPS Take 500 mg by mouth daily.    Marland Kitchen lovastatin (MEVACOR) 40 MG tablet Take 40 mg by mouth at bedtime.     . meloxicam (MOBIC) 15 MG tablet Take 15 mg by mouth daily as needed for pain.    .  mometasone (ELOCON) 0.1 % cream Apply 1 application topically daily as needed (ear irritation).    . pantoprazole (PROTONIX) 40 MG tablet Take 40 mg by mouth every morning.     Marland Kitchen albuterol (PROVENTIL HFA;VENTOLIN HFA) 108 (90 Base) MCG/ACT inhaler Inhale 2 puffs into the lungs every 4 (four) hours as needed for wheezing. (Patient not taking: Reported on 02/09/2020) 1 Inhaler 0  . lidocaine-prilocaine (EMLA) cream Apply topically daily. (Patient not taking: Reported on 02/09/2020)     No current facility-administered medications for this visit.     PHYSICAL EXAMINATION: ECOG PERFORMANCE STATUS: 0 - Asymptomatic Vitals:   02/09/20 1005  BP: 123/74  Pulse: 76  Resp: 16  Temp: 97.6 F (36.4 C)   Filed Weights   02/09/20 1005  Weight: 206 lb 6.4 oz (93.6 kg)    Physical Exam Constitutional:      General: She is not in acute distress. HENT:     Head: Normocephalic and atraumatic.  Eyes:     General: No scleral icterus. Cardiovascular:     Rate and Rhythm: Normal rate and regular rhythm.     Heart sounds: Normal heart sounds.  Pulmonary:     Effort: Pulmonary effort is normal. No respiratory distress.     Breath sounds: No wheezing.  Abdominal:     General: Bowel sounds are normal. There is no distension.     Palpations: Abdomen is soft.  Musculoskeletal:        General: No deformity. Normal range of motion.     Cervical back: Normal range of motion and neck supple.  Skin:    General: Skin is warm and dry.     Findings: No erythema or rash.  Neurological:     Mental Status: She is alert and oriented to person, place, and time. Mental status is at baseline.     Cranial Nerves: No cranial nerve deficit.     Coordination: Coordination normal.  Psychiatric:        Mood and Affect: Mood normal.     LABORATORY DATA:  I have reviewed the data as listed Lab Results  Component Value Date   WBC 6.3 01/03/2020   HGB 13.7 01/03/2020   HCT 42.4 01/03/2020   MCV 82.5 01/03/2020    PLT 193 01/03/2020   Recent Labs    10/26/19 1425 10/27/19 1217  NA 141 141  K 3.6 3.6  CL 105 105  CO2 27 27  GLUCOSE 125* 157*  BUN 15 13  CREATININE 1.08* 0.87  CALCIUM 9.0 9.2  GFRNONAA 52* >60  GFRAA >60 >60  PROT 7.1  --   ALBUMIN 4.1  --   AST 15  --   ALT 17  --   ALKPHOS 84  --   BILITOT 1.7*  --    Iron/TIBC/Ferritin/ %Sat No results found for: IRON, TIBC, FERRITIN, IRONPCTSAT    RADIOGRAPHIC STUDIES: I have personally reviewed the radiological images as listed and agreed with the findings in the report. No results found.    ASSESSMENT & PLAN:  1. Malignant neoplasm of upper-inner quadrant of right breast in female, estrogen receptor positive (HCC)   2. Family history of cancer   Cancer Staging Malignant neoplasm of upper-inner quadrant of right breast in female, estrogen receptor positive (HCC) Staging form: Breast, AJCC 8th Edition - Clinical stage from 10/26/2019: Stage IA (cT1b, cN0, cM0, G1, ER+, PR+, HER2-) - Signed by Rickard Patience, MD on 10/26/2019  #Stage IA right breast cancer Status post surgery and radiation. Rationale of using aromatase inhibitor -Arimidex  discussed with patient.  Side effects of Arimidex including but not limited to hot flush, joint pain, fatigue, mood swing, osteoporosis/increased bone fracture risk discussed with patient. Patient voices understanding and willing to proceed.  Plan 5 years Recommend annual diagnostic mammogram for surveillance . #12/27/2019 DEXA showed osteopenia 10-year major osteoporotic fracture right is 9.2%.   Recommend patient to take calcium 1200 mg daily and vitamin D supplementation. Discussed at length the risk factors [race, post-menopausal status, use of AI]; prevention/treatment strategies-  including but not limited to exercise calcium and vitamin D daily.  Discussed the potential side effects of bisphosphonate including but not limited to- hypocalcemia, arthralgia, body aches, osteonecrosis  discussed not to have invasive dental procedures few  weeks around the time of the infusions.  Patient agrees with the plan. Recommend patient to obtain dental clearance.  Family history of pancreatic cancer and breast cancer.  I recommend genetic is pending..  Patient agrees. 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  Orders Placed This Encounter  Procedures  . CBC with Differential/Platelet    Standing Status:   Future    Standing Expiration Date:   02/08/2021  . Comprehensive metabolic panel    Standing Status:   Future    Standing Expiration Date:   02/08/2021    All questions were answered. The patient knows to call the clinic with any problems questions or concerns.  cc [name], MD    Return of visit: 6 weeks to evaluate tolerability of Arimidex.   Earlie Server, MD, PhD Hematology Oncology [facility] Pager- [phone] 02/09/2020

## 2020-02-15 ENCOUNTER — Other Ambulatory Visit: Payer: Self-pay | Admitting: Oncology

## 2020-02-15 ENCOUNTER — Encounter: Payer: Self-pay | Admitting: Oncology

## 2020-02-15 DIAGNOSIS — M8580 Other specified disorders of bone density and structure, unspecified site: Secondary | ICD-10-CM

## 2020-02-15 HISTORY — DX: Other specified disorders of bone density and structure, unspecified site: M85.80

## 2020-02-28 ENCOUNTER — Encounter: Payer: Self-pay | Admitting: Radiation Oncology

## 2020-02-28 ENCOUNTER — Other Ambulatory Visit: Payer: Self-pay

## 2020-02-28 ENCOUNTER — Ambulatory Visit
Admission: RE | Admit: 2020-02-28 | Discharge: 2020-02-28 | Disposition: A | Discharge status: Home / Self Care | Payer: Medicare PPO | Source: Ambulatory Visit | Attending: Radiation Oncology | Admitting: Radiation Oncology

## 2020-02-28 VITALS — BP 144/79 | HR 77 | Temp 97.6°F | Resp 16 | Wt 205.4 lb

## 2020-02-28 DIAGNOSIS — C50211 Malignant neoplasm of upper-inner quadrant of right female breast: Secondary | ICD-10-CM | POA: Insufficient documentation

## 2020-02-28 DIAGNOSIS — Z170 Estrogen receptor positive status [ER+]: Secondary | ICD-10-CM | POA: Diagnosis not present

## 2020-02-28 DIAGNOSIS — Z923 Personal history of irradiation: Secondary | ICD-10-CM | POA: Diagnosis not present

## 2020-02-28 DIAGNOSIS — Z79811 Long term (current) use of aromatase inhibitors: Secondary | ICD-10-CM | POA: Diagnosis not present

## 2020-02-28 NOTE — Progress Notes (Signed)
Radiation Oncology Follow up Note  Name: Barbara [name]   Date:   02/28/2020 MRN:  [id number] DOB: Jun 05, 1950    This 70 y.o. female presents to the clinic today for 1 month follow-up status post whole breast radiation to her right breast for stage Ia ER/PR positive invasive mammary carcinoma.  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  Seen today in routine follow-up she is doing well.  She specifically denies breast tenderness cough or bone pain.  She has been started on.  Arimidex tolerant well without side effect.  COMPLICATIONS OF TREATMENT: none  FOLLOW UP COMPLIANCE: keeps appointments   PHYSICAL EXAM:  BP (!) 144/79 (BP Location: Left Arm, Patient Position: Sitting)   Pulse 77   Temp 97.6 F (36.4 C) (Tympanic)   Resp 16   Wt 205 lb 6.4 oz (93.2 kg)   BMI 34.18 kg/m  Lungs are clear to A&P cardiac examination essentially unremarkable with regular rate and rhythm. No dominant mass or nodularity is noted in either breast in 2 positions examined. Incision is well-healed. No axillary or supraclavicular adenopathy is appreciated. Cosmetic result is excellent.  Well-developed well-nourished patient in NAD. HEENT reveals PERLA, EOMI, discs not visualized.  Oral cavity is clear. No oral mucosal lesions are identified. Neck is clear without evidence of cervical or supraclavicular adenopathy. Lungs are clear to A&P. Cardiac examination is essentially unremarkable with regular rate and rhythm without murmur rub or thrill. Abdomen is benign with no organomegaly or masses noted. Motor sensory and DTR levels are equal and symmetric in the upper and lower extremities. Cranial nerves II through XII are grossly intact. Proprioception is intact. No peripheral adenopathy or edema is identified. No motor or  sensory levels are noted. Crude visual fields are within normal range.  RADIOLOGY RESULTS: No current films to review  PLAN: Present time patient is doing well 1 month out from whole breast radiation and pleased with her overall progress.  She continues on Arimidex without side effect.  I have asked to see her back in 4 to 5 months for follow-up.  Patient knows to call with any concerns.  I would like to take this opportunity to thank you for allowing me to participate in the care of your patient.Noreene Filbert, MD

## 2020-03-22 ENCOUNTER — Inpatient Hospital Stay (HOSPITAL_BASED_OUTPATIENT_CLINIC_OR_DEPARTMENT_OTHER): Payer: Medicare PPO | Admitting: Oncology

## 2020-03-22 ENCOUNTER — Inpatient Hospital Stay: Payer: Medicare PPO | Attending: Oncology

## 2020-03-22 ENCOUNTER — Inpatient Hospital Stay: Payer: Medicare PPO

## 2020-03-22 ENCOUNTER — Other Ambulatory Visit: Payer: Self-pay

## 2020-03-22 ENCOUNTER — Encounter: Payer: Self-pay | Admitting: Oncology

## 2020-03-22 VITALS — BP 128/77 | HR 89 | Temp 97.8°F | Resp 18 | Wt 206.9 lb

## 2020-03-22 DIAGNOSIS — Z1501 Genetic susceptibility to malignant neoplasm of breast: Secondary | ICD-10-CM | POA: Insufficient documentation

## 2020-03-22 DIAGNOSIS — Z800 Family history of malignant neoplasm of digestive organs: Secondary | ICD-10-CM | POA: Insufficient documentation

## 2020-03-22 DIAGNOSIS — N6091 Unspecified benign mammary dysplasia of right breast: Secondary | ICD-10-CM | POA: Diagnosis not present

## 2020-03-22 DIAGNOSIS — I10 Essential (primary) hypertension: Secondary | ICD-10-CM | POA: Diagnosis not present

## 2020-03-22 DIAGNOSIS — M8580 Other specified disorders of bone density and structure, unspecified site: Secondary | ICD-10-CM

## 2020-03-22 DIAGNOSIS — Z809 Family history of malignant neoplasm, unspecified: Secondary | ICD-10-CM | POA: Insufficient documentation

## 2020-03-22 DIAGNOSIS — C50211 Malignant neoplasm of upper-inner quadrant of right female breast: Secondary | ICD-10-CM | POA: Insufficient documentation

## 2020-03-22 DIAGNOSIS — Z170 Estrogen receptor positive status [ER+]: Secondary | ICD-10-CM | POA: Insufficient documentation

## 2020-03-22 DIAGNOSIS — Z9049 Acquired absence of other specified parts of digestive tract: Secondary | ICD-10-CM | POA: Diagnosis not present

## 2020-03-22 DIAGNOSIS — Z801 Family history of malignant neoplasm of trachea, bronchus and lung: Secondary | ICD-10-CM | POA: Insufficient documentation

## 2020-03-22 DIAGNOSIS — Z803 Family history of malignant neoplasm of breast: Secondary | ICD-10-CM | POA: Diagnosis not present

## 2020-03-22 DIAGNOSIS — Z79899 Other long term (current) drug therapy: Secondary | ICD-10-CM | POA: Insufficient documentation

## 2020-03-22 DIAGNOSIS — K219 Gastro-esophageal reflux disease without esophagitis: Secondary | ICD-10-CM | POA: Insufficient documentation

## 2020-03-22 DIAGNOSIS — Z8049 Family history of malignant neoplasm of other genital organs: Secondary | ICD-10-CM | POA: Diagnosis not present

## 2020-03-22 LAB — CBC WITH DIFFERENTIAL/PLATELET
Abs Immature Granulocytes: 0.03 10*3/uL (ref 0.00–0.07)
Basophils Absolute: 0 10*3/uL (ref 0.0–0.1)
Basophils Relative: 0 %
Eosinophils Absolute: 0.1 10*3/uL (ref 0.0–0.5)
Eosinophils Relative: 2 %
HCT: 42.2 % (ref 36.0–46.0)
Hemoglobin: 13.7 g/dL (ref 12.0–15.0)
Immature Granulocytes: 0 %
Lymphocytes Relative: 18 %
Lymphs Abs: 1.3 10*3/uL (ref 0.7–4.0)
MCH: 26.9 pg (ref 26.0–34.0)
MCHC: 32.5 g/dL (ref 30.0–36.0)
MCV: 82.9 fL (ref 80.0–100.0)
Monocytes Absolute: 0.5 10*3/uL (ref 0.1–1.0)
Monocytes Relative: 7 %
Neutro Abs: 5.2 10*3/uL (ref 1.7–7.7)
Neutrophils Relative %: 73 %
Platelets: 206 10*3/uL (ref 150–400)
RBC: 5.09 MIL/uL (ref 3.87–5.11)
RDW: 14.1 % (ref 11.5–15.5)
WBC: 7.1 10*3/uL (ref 4.0–10.5)
nRBC: 0 % (ref 0.0–0.2)

## 2020-03-22 LAB — COMPREHENSIVE METABOLIC PANEL
ALT: 18 U/L (ref 0–44)
AST: 17 U/L (ref 15–41)
Albumin: 4 g/dL (ref 3.5–5.0)
Alkaline Phosphatase: 83 U/L (ref 38–126)
Anion gap: 10 (ref 5–15)
BUN: 17 mg/dL (ref 8–23)
CO2: 26 mmol/L (ref 22–32)
Calcium: 9.3 mg/dL (ref 8.9–10.3)
Chloride: 103 mmol/L (ref 98–111)
Creatinine, Ser: 0.93 mg/dL (ref 0.44–1.00)
GFR, Estimated: >60 mL/min (ref >60)
Glucose, Bld: 122 mg/dL — ABNORMAL HIGH (ref 70–99)
Potassium: 3.7 mmol/L (ref 3.5–5.1)
Sodium: 139 mmol/L (ref 135–145)
Total Bilirubin: 1.2 mg/dL (ref 0.3–1.2)
Total Protein: 7.2 g/dL (ref 6.5–8.1)

## 2020-03-22 MED ORDER — ZOLEDRONIC ACID 4 MG/100ML IV SOLN
4.0000 mg | Freq: Once | INTRAVENOUS | Status: AC
  Administered 2020-03-22: 4 mg via INTRAVENOUS
  Filled 2020-03-22: qty 100

## 2020-03-22 MED ORDER — SODIUM CHLORIDE 0.9 % IV SOLN
Freq: Once | INTRAVENOUS | Status: AC
  Filled 2020-03-22: qty 250

## 2020-03-22 NOTE — Progress Notes (Signed)
Patient here for follow up. No new concerns voiced.  °

## 2020-03-22 NOTE — Progress Notes (Signed)
Hematology/Oncology Consult note [facility] [phone] [phone]   Patient Care Team: Marina Goodell, MD as PCP - General (Family Medicine)  REFERRING PROVIDER: Marina Goodell, MD  CHIEF COMPLAINTS/REASON FOR VISIT:  Evaluation of breast cancer  HISTORY OF PRESENTING ILLNESS:   Barbara [name] is a  70 y.o.  female with PMH listed below was seen in consultation at the request of  Marina Goodell, MD  for evaluation of breast cancer  10/13/2019 diagnostic mammogram  Showed 38mm spiculated mass in the right breast 1:00. Unchanged left axillary probabaly benign mass- 1 year stability.  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 ER 90% positive, PR 1-0% positive, HER2 negative.  Patient denies any breast skin changes or nipple discharge  Family history of breast cancer: Emelda Brothers, and Dennie Bible first cousin Family history of other cancers: lung cancer in mother, pancreatic cancer in father, unknown cancer in pat uncle.   Menarche: 40 or 70 Menopause: age of 80 Number of pregnancies : 2 Age at first live childbirth:2 Used OCP: remote use of OCP for couple of years Used estrogen and progesterone therapy: denies History of Radiation to the chest: denies Previous of breast biopsy: previous biopsies in 1970s  # 10/30/2019, patient underwent right lumpectomy and sentinel lymph node biopsy.  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 4 mm pT1a pN0 tubular carcinoma, grade 1. #01/18/2020, finished adjuvant radiation. #January 2022, started on Arimidex  # Family history of pancreatic cancer and breast cancer.  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   INTERVAL HISTORY Barbara [name] is a 70 y.o. female who has above history reviewed by me today presents for follow up visit for management of right breast cancer. Problems and complaints are listed below: Patient has been on Arimidex since last visit She denies experiencing much side effects.  Tolerates very well. No new complaints. Review of Systems  Constitutional: Negative for appetite change, chills, fatigue and fever.  HENT:   Negative for hearing loss and voice change.   Eyes: Negative for eye problems.  Respiratory: Negative for chest tightness and cough.   Cardiovascular: Negative for chest pain.  Gastrointestinal: Negative for abdominal distention, abdominal pain and blood in stool.  Endocrine: Negative for hot flashes.  Genitourinary: Negative for difficulty urinating and frequency.   Musculoskeletal: Negative for arthralgias.  Skin: Negative for itching and rash.  Neurological: Negative for extremity weakness.  Hematological: Negative for adenopathy.  Psychiatric/Behavioral: Negative for confusion.    MEDICAL HISTORY:  Past Medical History:  Diagnosis Date  . Cancer (HCC)   . Family history of breast cancer   . Family history of lung cancer   . Family history of pancreatic cancer   . Family history of uterine cancer   . GERD (gastroesophageal reflux disease)   . Headache    migraines  . Hypertension   . Osteopenia 02/15/2020  . Pre-diabetes   . Sleep apnea    uses cpap    SURGICAL HISTORY: Past Surgical History:  Procedure Laterality Date  . APPENDECTOMY    . BREAST BIOPSY Right 10/20/2019   Affirm bx-"X" clip path pending  . BREAST CYST EXCISION Right   . BREAST CYST  EXCISION Right   . BREAST CYST EXCISION Left   . BREAST SURGERY    . CHOLECYSTECTOMY    . PART MASTECTOMY,RADIO FREQUENCY LOCALIZER,AXILLARY SENTINEL NODE BIOPSY Right 10/30/2019   Procedure: PART MASTECTOMY,RADIO FREQUENCY  LOCALIZER,AXILLARY SENTINEL NODE BIOPSY;  Surgeon: Herbert Pun, MD;  Location: [facility] ORS;  Service: General;  Laterality: Right;    SOCIAL HISTORY: Social History   Socioeconomic History  . Marital status: Married    Spouse name: Not on file  . Number of children: Not on file  . Years of education: Not on file  . Highest education level: Not on file  Occupational History  . Not on file  Tobacco Use  . Smoking status: Never Smoker  . Smokeless tobacco: Never Used  Vaping Use  . Vaping Use: Never used  Substance and Sexual Activity  . Alcohol use: No  . Drug use: No  . Sexual activity: Not on file  Other Topics Concern  . Not on file  Social History Narrative  . Not on file   Social Determinants of Health   Financial Resource Strain: Not on file  Food Insecurity: Not on file  Transportation Needs: Not on file  Physical Activity: Not on file  Stress: Not on file  Social Connections: Not on file  Intimate Partner Violence: Not on file    FAMILY HISTORY: Family History  Problem Relation Age of Onset  . Breast cancer Paternal Aunt   . Lung cancer Mother   . Pancreatic cancer Father   . Cancer Paternal Uncle        unk type  . Cancer Paternal Aunt        unk type, possibly breast  . Breast cancer Cousin   . Cancer Cousin        unk type    ALLERGIES:  has No Known Allergies.  MEDICATIONS:  Current Outpatient Medications  Medication Sig Dispense Refill  . amLODipine (NORVASC) 5 MG tablet Take 5 mg by mouth at bedtime.     Marland Kitchen anastrozole (ARIMIDEX) 1 MG tablet Take 1 tablet (1 mg total) by mouth daily. 30 tablet 2  . aspirin 81 MG EC tablet Take 81 mg by mouth daily.     Marland Kitchen azelastine (ASTELIN) 0.1 % nasal spray Place into the nose.    . Calcium Carbonate-Vit D-Min (CALCIUM 1200 PO) Take by mouth.    . cetirizine (ZYRTEC) 10 MG tablet Take 10 mg by mouth every morning.     . Cholecalciferol (VITAMIN D) 50 MCG (2000 UT) CAPS Take 2,000 Units by mouth  daily.    Marland Kitchen CINNAMON PO Take 1,000 mg by mouth in the morning and at bedtime.     Javier Docker Oil 500 MG CAPS Take 500 mg by mouth daily.    Marland Kitchen lovastatin (MEVACOR) 40 MG tablet Take 40 mg by mouth at bedtime.     . meloxicam (MOBIC) 15 MG tablet Take 15 mg by mouth daily as needed for pain.    . mometasone (ELOCON) 0.1 % cream Apply 1 application topically daily as needed (ear irritation).    . pantoprazole (PROTONIX) 40 MG tablet Take 40 mg by mouth every morning.      No current facility-administered medications for this visit.     PHYSICAL EXAMINATION: ECOG PERFORMANCE STATUS: 0 - Asymptomatic Vitals:   03/22/20 1413  BP: 128/77  Pulse: 89  Resp: 18  Temp: 97.8 F (36.6 C)   Filed Weights   03/22/20 1413  Weight: 206  lb 14.4 oz (93.8 kg)    Physical Exam Constitutional:      General: She is not in acute distress. HENT:     Head: Normocephalic and atraumatic.  Eyes:     General: No scleral icterus. Cardiovascular:     Rate and Rhythm: Normal rate and regular rhythm.     Heart sounds: Normal heart sounds.  Pulmonary:     Effort: Pulmonary effort is normal. No respiratory distress.     Breath sounds: No wheezing.  Abdominal:     General: Bowel sounds are normal. There is no distension.     Palpations: Abdomen is soft.  Musculoskeletal:        General: No deformity. Normal range of motion.     Cervical back: Normal range of motion and neck supple.  Skin:    General: Skin is warm and dry.     Findings: No erythema or rash.  Neurological:     Mental Status: She is alert and oriented to person, place, and time. Mental status is at baseline.     Cranial Nerves: No cranial nerve deficit.     Coordination: Coordination normal.  Psychiatric:        Mood and Affect: Mood normal.     LABORATORY DATA:  I have reviewed the data as listed Lab Results  Component Value Date   WBC 7.1 03/22/2020   HGB 13.7 03/22/2020   HCT 42.2 03/22/2020   MCV 82.9 03/22/2020   PLT 206  03/22/2020   Recent Labs    10/26/19 1425 10/27/19 1217 03/22/20 1356  NA 141 141 139  K 3.6 3.6 3.7  CL 105 105 103  CO2 $Re'27 27 26  'qfi$ GLUCOSE 125* 157* 122*  BUN $Re'15 13 17  'RWP$ CREATININE 1.08* 0.87 0.93  CALCIUM 9.0 9.2 9.3  GFRNONAA 52* >60 >60  GFRAA >60 >60  --   PROT 7.1  --  7.2  ALBUMIN 4.1  --  4.0  AST 15  --  17  ALT 17  --  18  ALKPHOS 84  --  83  BILITOT 1.7*  --  1.2   Iron/TIBC/Ferritin/ %Sat No results found for: IRON, TIBC, FERRITIN, IRONPCTSAT    RADIOGRAPHIC STUDIES: I have personally reviewed the radiological images as listed and agreed with the findings in the report. No results found.    ASSESSMENT & PLAN:  1. Malignant neoplasm of upper-inner quadrant of right breast in female, estrogen receptor positive ([place])   2. Osteopenia, unspecified location   Cancer Staging Malignant neoplasm of upper-inner quadrant of right breast in female, estrogen receptor positive ([place]) Staging form: Breast, AJCC 8th Edition - Clinical stage from 10/26/2019: Stage IA (cT1b, cN0, cM0, G1, ER+, PR+, HER2-) - Signed by Earlie Server, MD on 10/26/2019  #Stage IA right breast cancer Status post surgery and radiation. Tolerating Arimidex very well.  Continue.  Stage IA, plan 5 years Recommend annual diagnostic mammogram for surveillance . #Osteopenia  12/27/2019 DEXA showed osteopenia 10-year major osteoporotic fracture right is 9.2%.   Continue calcium 1200 mg daily and vitamin D supplementation.  Dental clearance has been obtained. Proceed with Zometa 4 mg today.  Rationale and potential side effects were discussed with patient.  She agrees with the plan.  Plan to repeat in 6 months .   Orders Placed This Encounter  Procedures  . CBC with Differential/Platelet    Standing Status:   Future    Standing Expiration Date:   03/22/2021  . Comprehensive metabolic  panel    Standing Status:   Future    Standing Expiration Date:   03/22/2021    All questions were answered. The  patient knows to call the clinic with any problems questions or concerns.  cc [name], MD    Return of visit: 4 months.Earlie Server, MD, PhD Hematology Oncology [facility] at [facility] Pager- [phone] 03/22/2020

## 2020-05-07 ENCOUNTER — Other Ambulatory Visit: Payer: Self-pay | Admitting: Oncology

## 2020-05-07 ENCOUNTER — Other Ambulatory Visit: Payer: Self-pay | Admitting: *Deleted

## 2020-05-07 MED ORDER — ANASTROZOLE 1 MG PO TABS: 1.0000 mg | ORAL_TABLET | Freq: Every day | ORAL | 0 refills | Status: DC

## 2020-05-07 NOTE — Addendum Note (Signed)
Addended by: Betti Cruz on: 05/07/2020 10:50 AM   Modules accepted: Orders

## 2020-05-09 ENCOUNTER — Other Ambulatory Visit: Payer: Self-pay | Admitting: Oncology

## 2020-05-09 MED ORDER — ANASTROZOLE 1 MG PO TABS: 1.0000 mg | ORAL_TABLET | Freq: Every day | ORAL | 3 refills | Status: DC

## 2020-06-28 ENCOUNTER — Other Ambulatory Visit: Payer: Self-pay | Admitting: Oncology

## 2020-06-28 NOTE — Telephone Encounter (Signed)
She needs follow up please arrange her to have bilateral mammogram in Sept 2022, and then lab cbc cmp MD zometa. Thanks.

## 2020-07-31 ENCOUNTER — Ambulatory Visit: Payer: Medicare PPO | Admitting: Radiation Oncology

## 2020-08-02 ENCOUNTER — Other Ambulatory Visit: Payer: Self-pay

## 2020-08-02 ENCOUNTER — Inpatient Hospital Stay: Payer: Medicare PPO | Attending: Oncology

## 2020-08-02 ENCOUNTER — Inpatient Hospital Stay (HOSPITAL_BASED_OUTPATIENT_CLINIC_OR_DEPARTMENT_OTHER): Payer: Medicare PPO | Admitting: Nurse Practitioner

## 2020-08-02 VITALS — BP 116/68 | HR 83 | Temp 98.8°F | Resp 16 | Wt 206.0 lb

## 2020-08-02 DIAGNOSIS — C50211 Malignant neoplasm of upper-inner quadrant of right female breast: Secondary | ICD-10-CM | POA: Diagnosis present

## 2020-08-02 DIAGNOSIS — Z170 Estrogen receptor positive status [ER+]: Secondary | ICD-10-CM | POA: Diagnosis not present

## 2020-08-02 DIAGNOSIS — Z79811 Long term (current) use of aromatase inhibitors: Secondary | ICD-10-CM | POA: Diagnosis not present

## 2020-08-02 DIAGNOSIS — D229 Melanocytic nevi, unspecified: Secondary | ICD-10-CM

## 2020-08-02 DIAGNOSIS — M8580 Other specified disorders of bone density and structure, unspecified site: Secondary | ICD-10-CM | POA: Diagnosis not present

## 2020-08-02 LAB — CBC WITH DIFFERENTIAL/PLATELET
Abs Immature Granulocytes: 0.03 10*3/uL (ref 0.00–0.07)
Basophils Absolute: 0 10*3/uL (ref 0.0–0.1)
Basophils Relative: 1 %
Eosinophils Absolute: 0.1 10*3/uL (ref 0.0–0.5)
Eosinophils Relative: 2 %
HCT: 41.4 % (ref 36.0–46.0)
Hemoglobin: 13.7 g/dL (ref 12.0–15.0)
Immature Granulocytes: 1 %
Lymphocytes Relative: 18 %
Lymphs Abs: 1 10*3/uL (ref 0.7–4.0)
MCH: 27.7 pg (ref 26.0–34.0)
MCHC: 33.1 g/dL (ref 30.0–36.0)
MCV: 83.6 fL (ref 80.0–100.0)
Monocytes Absolute: 0.3 10*3/uL (ref 0.1–1.0)
Monocytes Relative: 5 %
Neutro Abs: 4 10*3/uL (ref 1.7–7.7)
Neutrophils Relative %: 73 %
Platelets: 175 10*3/uL (ref 150–400)
RBC: 4.95 MIL/uL (ref 3.87–5.11)
RDW: 14.5 % (ref 11.5–15.5)
WBC: 5.5 10*3/uL (ref 4.0–10.5)
nRBC: 0 % (ref 0.0–0.2)

## 2020-08-02 LAB — COMPREHENSIVE METABOLIC PANEL
ALT: 20 U/L (ref 0–44)
AST: 16 U/L (ref 15–41)
Albumin: 3.9 g/dL (ref 3.5–5.0)
Alkaline Phosphatase: 66 U/L (ref 38–126)
Anion gap: 8 (ref 5–15)
BUN: 21 mg/dL (ref 8–23)
CO2: 28 mmol/L (ref 22–32)
Calcium: 9.4 mg/dL (ref 8.9–10.3)
Chloride: 103 mmol/L (ref 98–111)
Creatinine, Ser: 1.19 mg/dL — ABNORMAL HIGH (ref 0.44–1.00)
GFR, Estimated: 49 mL/min — ABNORMAL LOW (ref >60)
Glucose, Bld: 135 mg/dL — ABNORMAL HIGH (ref 70–99)
Potassium: 3.7 mmol/L (ref 3.5–5.1)
Sodium: 139 mmol/L (ref 135–145)
Total Bilirubin: 1.8 mg/dL — ABNORMAL HIGH (ref 0.3–1.2)
Total Protein: 6.9 g/dL (ref 6.5–8.1)

## 2020-08-02 NOTE — Addendum Note (Signed)
Addended by: Verlon Au on: 08/02/2020 01:41 PM   Modules accepted: Orders

## 2020-08-02 NOTE — Progress Notes (Signed)
Patient denies new problems/concerns today.   °

## 2020-08-02 NOTE — Progress Notes (Signed)
Hematology/Oncology Consult note [facility] Telephone:([phone] [phone]   Patient Care Team: [name], MD as PCP - General (Family Medicine) Earlie Server, MD as Consulting Physician (Oncology) Noreene Filbert, MD as Referring Physician (Radiation Oncology) Herbert Pun, MD as Consulting Physician (General Surgery)  REFERRING PROVIDER: [name], MD   CHIEF COMPLAINTS/REASON FOR VISIT:  Evaluation of breast cancer  HISTORY OF PRESENTING ILLNESS:   Barbara [name] is a  69 y.o.  female with PMH listed below was seen in consultation at the request of  [name], MD  for evaluation of breast cancer  10/13/2019 diagnostic mammogram  Showed 48mm spiculated mass in the right breast 1:00. Unchanged left axillary probabaly benign mass- 1 year stability.  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 ER 90% positive, PR 1-0% positive, HER2 negative.  Patient denies any breast skin changes or nipple discharge  Family history of breast cancer: Ethlyn Daniels, and Fraser Din first cousin Family history of other cancers: lung cancer in mother, pancreatic cancer in father, unknown cancer in pat uncle.   Menarche: 75 or 58 Menopause: age of 42 Number of pregnancies : 2 Age at first live childbirth:2 Used OCP: remote use of OCP for couple of years Used estrogen and progesterone therapy: denies History of Radiation to the chest: denies Previous of breast biopsy: previous biopsies in 1970s  # 10/30/2019, patient underwent right lumpectomy and sentinel lymph node biopsy.  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 4 mm pT1a pN0 tubular carcinoma, grade 1. #01/18/2020, finished  adjuvant radiation. #January 2022, started on Arimidex  # Family history of pancreatic cancer and breast cancer.  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   INTERVAL HISTORY: Barbara [name] is a 70 y.o. female with above history of right breast cancer who returns to clinic for labs, further evaluation and consideration of continuation of arimidex. She continues to tolerate medication well without significant side effects. Denies any neurologic complaints. Denies recent fevers or illnesses. Denies any easy bleeding or bruising. Reports good appetite and denies weight loss. Denies chest pain. Denies any nausea, vomiting, constipation, or diarrhea. Denies urinary complaints. Patient offers no further specific complaints today.  Review of Systems  Constitutional:  Negative for appetite change, chills, fatigue and fever.  HENT:   Negative for hearing loss and voice change.   Eyes:  Negative for eye problems.  Respiratory:  Negative for chest tightness and cough.   Cardiovascular:  Negative for chest pain.  Gastrointestinal:  Negative for abdominal distention, abdominal pain and blood in stool.  Endocrine: Negative for hot flashes.  Genitourinary:  Negative for difficulty urinating and frequency.   Musculoskeletal:  Negative for arthralgias.  Skin:  Negative for itching and rash.  Neurological:  Negative for extremity weakness.  Hematological:  Negative for adenopathy.  Psychiatric/Behavioral:  Negative for confusion.    MEDICAL HISTORY:  Past Medical History:  Diagnosis Date   Cancer [facility])    Family history of breast cancer    Family history of lung cancer    Family history of pancreatic cancer    Family history of uterine cancer    GERD (gastroesophageal reflux disease)    Headache  migraines   Hypertension    Osteopenia 02/15/2020   Pre-diabetes    Sleep apnea    uses cpap    SURGICAL HISTORY: Past  Surgical History:  Procedure Laterality Date   APPENDECTOMY     BREAST BIOPSY Right 10/20/2019   Affirm bx-"X" clip path pending   BREAST CYST EXCISION Right    BREAST CYST EXCISION Right    BREAST CYST EXCISION Left    BREAST SURGERY     CHOLECYSTECTOMY     PART MASTECTOMY,RADIO FREQUENCY LOCALIZER,AXILLARY SENTINEL NODE BIOPSY Right 10/30/2019   Procedure: PART MASTECTOMY,RADIO FREQUENCY LOCALIZER,AXILLARY SENTINEL NODE BIOPSY;  Surgeon: Herbert Pun, MD;  Location: [facility] ORS;  Service: General;  Laterality: Right;    SOCIAL HISTORY: Social History   Socioeconomic History   Marital status: Married    Spouse name: Not on file   Number of children: Not on file   Years of education: Not on file   Highest education level: Not on file  Occupational History   Not on file  Tobacco Use   Smoking status: Never   Smokeless tobacco: Never  Vaping Use   Vaping Use: Never used  Substance and Sexual Activity   Alcohol use: No   Drug use: No   Sexual activity: Not on file  Other Topics Concern   Not on file  Social History Narrative   Not on file   Social Determinants of Health   Financial Resource Strain: Not on file  Food Insecurity: Not on file  Transportation Needs: Not on file  Physical Activity: Not on file  Stress: Not on file  Social Connections: Not on file  Intimate Partner Violence: Not on file    FAMILY HISTORY: Family History  Problem Relation Age of Onset   Breast cancer Paternal Aunt    Lung cancer Mother    Pancreatic cancer Father    Cancer Paternal Uncle        unk type   Cancer Paternal Aunt        unk type, possibly breast   Breast cancer Cousin    Cancer Cousin        unk type    ALLERGIES:  has No Known Allergies.  MEDICATIONS:  Current Outpatient Medications  Medication Sig Dispense Refill   amLODipine (NORVASC) 5 MG tablet Take 5 mg by mouth at bedtime.      anastrozole (ARIMIDEX) 1 MG tablet Take 1 tablet by mouth once daily 90  tablet 1   aspirin 81 MG EC tablet Take 81 mg by mouth daily.      azelastine (ASTELIN) 0.1 % nasal spray Place into the nose.     Calcium Carbonate-Vit D-Min (CALCIUM 1200 PO) Take by mouth.     cetirizine (ZYRTEC) 10 MG tablet Take 10 mg by mouth every morning.      Cholecalciferol (VITAMIN D) 50 MCG (2000 UT) CAPS Take 2,000 Units by mouth daily.     CINNAMON PO Take 1,000 mg by mouth in the morning and at bedtime.      Krill Oil 500 MG CAPS Take 500 mg by mouth daily.     lovastatin (MEVACOR) 40 MG tablet Take 40 mg by mouth at bedtime.      meloxicam (MOBIC) 15 MG tablet Take 15 mg by mouth daily as needed for pain.     mometasone (ELOCON) 0.1 % cream Apply 1 application topically daily as needed (ear irritation).     pantoprazole (PROTONIX) 40 MG tablet Take 40 mg  by mouth every morning.      No current facility-administered medications for this visit.    PHYSICAL EXAMINATION: ECOG PERFORMANCE STATUS: 0 - Asymptomatic Vitals:   08/02/20 1312  BP: 116/68  Pulse: 83  Resp: 16  Temp: 98.8 F (37.1 C)    Filed Weights   08/02/20 1312  Weight: 206 lb (93.4 kg)    Physical Exam Constitutional:      General: She is not in acute distress.    Appearance: She is well-developed. She is not ill-appearing.  HENT:     Head: Normocephalic and atraumatic.     Nose: Nose normal.     Mouth/Throat:     Pharynx: No oropharyngeal exudate.  Eyes:     General: No scleral icterus.    Conjunctiva/sclera: Conjunctivae normal.  Cardiovascular:     Rate and Rhythm: Normal rate and regular rhythm.     Heart sounds: Normal heart sounds.  Pulmonary:     Effort: Pulmonary effort is normal. No respiratory distress.     Breath sounds: Normal breath sounds. No wheezing.  Abdominal:     General: Bowel sounds are normal. There is no distension.     Palpations: Abdomen is soft.     Tenderness: There is no abdominal tenderness.  Musculoskeletal:        General: No tenderness or deformity.  Normal range of motion.     Cervical back: Normal range of motion and neck supple.  Skin:    General: Skin is warm and dry.     Coloration: Skin is not pale.     Findings: No erythema or rash.  Neurological:     General: No focal deficit present.     Mental Status: She is alert and oriented to person, place, and time. Mental status is at baseline.     Cranial Nerves: No cranial nerve deficit.     Coordination: Coordination normal.  Psychiatric:        Mood and Affect: Mood normal.        Behavior: Behavior normal.  Breast exam was performed in seated and lying down position. Patient is status post right lumpectomy with a well-healed surgical scar. No evidence of any palpable masses. No evidence of axillary adenopathy. No evidence of any palpable masses or lumps in the left breast. No evidence of left axillary adenopathy   LABORATORY DATA:  I have reviewed the data as listed Lab Results  Component Value Date   WBC 5.5 08/02/2020   HGB 13.7 08/02/2020   HCT 41.4 08/02/2020   MCV 83.6 08/02/2020   PLT 175 08/02/2020   Recent Labs    10/26/19 1425 10/27/19 1217 03/22/20 1356  NA 141 141 139  K 3.6 3.6 3.7  CL 105 105 103  CO2 $Re'27 27 26  'aRy$ GLUCOSE 125* 157* 122*  BUN $Re'15 13 17  'dUM$ CREATININE 1.08* 0.87 0.93  CALCIUM 9.0 9.2 9.3  GFRNONAA 52* >60 >60  GFRAA >60 >60  --   PROT 7.1  --  7.2  ALBUMIN 4.1  --  4.0  AST 15  --  17  ALT 17  --  18  ALKPHOS 84  --  83  BILITOT 1.7*  --  1.2   Iron/TIBC/Ferritin/ %Sat No results found for: IRON, TIBC, FERRITIN, IRONPCTSAT    RADIOGRAPHIC STUDIES: I have personally reviewed the radiological images as listed and agreed with the findings in the report. No results found.   ASSESSMENT & PLAN:  1. Malignant neoplasm of  upper-inner quadrant of right breast in female, estrogen receptor positive ([place])   Cancer Staging Malignant neoplasm of upper-inner quadrant of right breast in female, estrogen receptor positive ([place]) Staging form:  Breast, AJCC 8th Edition - Clinical stage from 10/26/2019: Stage IA (cT1b, cN0, cM0, G1, ER+, PR+, HER2-) - Signed by Earlie Server, MD on 10/26/2019  Stage IA right breast Cancer - s/p surgery and radiation. Currently on arimidex. Clinically NED today. Tolerating arimidex well. Based on ER/positive of her cancer, she will benefit from at least 5 years of adjuvant treatment completing in 2027. Annual mammogram in September 2022. NCCN survivorship guidelines reviewed today. Continue surveillance.  Osteopenia - continue calcium 1200 mg and vitamin d 1000 iu daily along with weight bearing exercise as tolerated. Continue zometa 4 mg every 6 months. Plan to receive around 09/19/20 with lab prior.   Plan:  August 18th, 2022-lab - cbc, cmp +/- zometa End of September - mammogram 4 mo- lab, dr Tasia Catchings   Orders Placed This Encounter  Procedures   MM DIAG BREAST TOMO BILATERAL    Standing Status:   Future    Standing Expiration Date:   02/02/2021    Order Specific Question:   Reason for Exam (SYMPTOM  OR DIAGNOSIS REQUIRED)    Answer:   breast cancer surveillance    Order Specific Question:   Preferred imaging location?    Answer:   [facility]    All questions were answered. The patient knows to call the clinic with any problems questions or concerns.  Beckey Rutter, DNP, AGNP-C [place] [facility] [phone] (clinic)   cc Feldpausch, Chrissie Noa, MD

## 2020-08-08 ENCOUNTER — Ambulatory Visit
Admission: RE | Admit: 2020-08-08 | Discharge: 2020-08-08 | Disposition: A | Discharge status: Home / Self Care | Payer: Medicare PPO | Source: Ambulatory Visit | Attending: Radiation Oncology | Admitting: Radiation Oncology

## 2020-08-08 ENCOUNTER — Other Ambulatory Visit: Payer: Self-pay | Admitting: *Deleted

## 2020-08-08 ENCOUNTER — Ambulatory Visit: Payer: Medicare PPO | Admitting: Dermatology

## 2020-08-08 ENCOUNTER — Other Ambulatory Visit: Payer: Self-pay

## 2020-08-08 VITALS — BP 130/89 | HR 80 | Temp 97.3°F | Resp 16 | Wt 203.3 lb

## 2020-08-08 DIAGNOSIS — L820 Inflamed seborrheic keratosis: Secondary | ICD-10-CM

## 2020-08-08 DIAGNOSIS — D0359 Melanoma in situ of other part of trunk: Secondary | ICD-10-CM

## 2020-08-08 DIAGNOSIS — Z170 Estrogen receptor positive status [ER+]: Secondary | ICD-10-CM

## 2020-08-08 DIAGNOSIS — C439 Malignant melanoma of skin, unspecified: Secondary | ICD-10-CM

## 2020-08-08 DIAGNOSIS — L578 Other skin changes due to chronic exposure to nonionizing radiation: Secondary | ICD-10-CM

## 2020-08-08 DIAGNOSIS — C50211 Malignant neoplasm of upper-inner quadrant of right female breast: Secondary | ICD-10-CM

## 2020-08-08 DIAGNOSIS — D489 Neoplasm of uncertain behavior, unspecified: Secondary | ICD-10-CM

## 2020-08-08 DIAGNOSIS — D1800 Hemangioma unspecified site: Secondary | ICD-10-CM

## 2020-08-08 DIAGNOSIS — L219 Seborrheic dermatitis, unspecified: Secondary | ICD-10-CM | POA: Diagnosis not present

## 2020-08-08 DIAGNOSIS — L814 Other melanin hyperpigmentation: Secondary | ICD-10-CM

## 2020-08-08 DIAGNOSIS — Z1283 Encounter for screening for malignant neoplasm of skin: Secondary | ICD-10-CM

## 2020-08-08 DIAGNOSIS — R202 Paresthesia of skin: Secondary | ICD-10-CM | POA: Diagnosis not present

## 2020-08-08 DIAGNOSIS — L821 Other seborrheic keratosis: Secondary | ICD-10-CM

## 2020-08-08 DIAGNOSIS — Z79811 Long term (current) use of aromatase inhibitors: Secondary | ICD-10-CM | POA: Insufficient documentation

## 2020-08-08 DIAGNOSIS — D229 Melanocytic nevi, unspecified: Secondary | ICD-10-CM

## 2020-08-08 DIAGNOSIS — Z923 Personal history of irradiation: Secondary | ICD-10-CM | POA: Insufficient documentation

## 2020-08-08 DIAGNOSIS — D2361 Other benign neoplasm of skin of right upper limb, including shoulder: Secondary | ICD-10-CM

## 2020-08-08 DIAGNOSIS — D239 Other benign neoplasm of skin, unspecified: Secondary | ICD-10-CM

## 2020-08-08 HISTORY — DX: Malignant melanoma of skin, unspecified: C43.9

## 2020-08-08 MED ORDER — CLOBETASOL PROPIONATE 0.05 % EX CREA: 1.0000 "application " | TOPICAL_CREAM | Freq: Two times a day (BID) | CUTANEOUS | 1 refills | Status: DC

## 2020-08-08 NOTE — Progress Notes (Signed)
New Patient Visit  Subjective  Barbara [name] is a 70 y.o. female who presents for the following: Other (Spot under right breast that was noticed during her treatment for breast cancer.). She also has several spots on neck and lower back that get irritated. She is here for skin check  Referred by Dr. Baruch Gouty  The following portions of the chart were reviewed this encounter and updated as appropriate:        Review of Systems:  No other skin or systemic complaints except as noted in HPI or Assessment and Plan.  Objective  Well appearing patient in no apparent distress; mood and affect are within normal limits.  All skin waist up examined.  Right Breast Stuck-on, waxy, tan-brown papule --Discussed benign etiology and prognosis.   Left Upper Back Pink brown patch of left spinal mid back  Right Upper Arm - Anterior Firm pink/brown papulenodule with dimple sign.   Left > right ear canal Pink scaliness  Right lat neck x 3, spinal lower back x 1 (4) Erythematous keratotic or waxy stuck-on papule  Right lat mid back 5 mm medium dark brown flat papule        Assessment & Plan   Lentigines - Scattered tan macules - Due to sun exposure - Benign-appering, observe - Recommend daily broad spectrum sunscreen SPF 30+ to sun-exposed areas, reapply every 2 hours as needed. - Call for any changes  Seborrheic Keratoses - Stuck-on, waxy, tan-brown papules and/or plaques  - Benign-appearing - Discussed benign etiology and prognosis. - Observe - Call for any changes  Melanocytic Nevi - Tan-brown and/or pink-flesh-colored symmetric macules and papules - Benign appearing on exam today - Observation - Call clinic for new or changing moles - Recommend daily use of broad spectrum spf 30+ sunscreen to sun-exposed areas.   Hemangiomas - Red papules - Discussed benign nature - Observe - Call for any changes  Actinic Damage - Chronic condition, secondary to cumulative  UV/sun exposure - diffuse scaly erythematous macules with underlying dyspigmentation - Recommend daily broad spectrum sunscreen SPF 30+ to sun-exposed areas, reapply every 2 hours as needed.  - Staying in the shade or wearing long sleeves, sun glasses (UVA+UVB protection) and wide brim hats (4-inch brim around the entire circumference of the hat) are also recommended for sun protection.  - Call for new or changing lesions.  Skin cancer screening performed today.   Seborrheic keratosis Right Breast  Reassured benign age-related growth.  Recommend observation.  Discussed cryotherapy if spot(s) become irritated or inflamed.  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 Your physician has selected the right strength medicine for your problem and area affected on the body. Please use your medication only as directed by your physician to prevent side effects.    clobetasol cream (TEMOVATE) 0.05 % - Left Upper Back Apply 1 application topically 2 (two) times daily.  Dermatofibroma Right Upper Arm - Anterior  Benign growth possibly related to trauma, such as an insect bite.  Discussed removal (shave vrs excision), resulting scar and risk of recurrence. Since not bothersome, will observe for now.  Seborrheic dermatitis Left > right ear canal  Seborrheic Dermatitis  -  is a chronic persistent rash characterized by pinkness  and scaling most commonly of the mid face but also can occur on the scalp (dandruff), ears; mid chest and mid back. It tends to be exacerbated by stress and cooler weather.  People who have neurologic disease may experience new onset or exacerbation of  existing seborrheic dermatitis.  The condition is not curable but treatable and can be controlled.  Continue Elocon cream qd/bid prn flares- pt has  Inflamed seborrheic keratosis Right lat neck x 3, spinal lower back x 1  Destruction of lesion - Right lat neck x 3, spinal lower back x 1  Destruction method: cryotherapy   Informed consent: discussed and consent obtained   Lesion destroyed using liquid nitrogen: Yes   Region frozen until ice ball extended beyond lesion: Yes   Outcome: patient tolerated procedure well with no complications   Post-procedure details: wound care instructions given   Additional details:  Prior to procedure, discussed risks of blister formation, small wound, skin dyspigmentation, or rare scar following cryotherapy. Recommend Vaseline ointment to treated areas while healing.   Neoplasm of uncertain behavior Right lat mid back  Epidermal / dermal shaving  Lesion diameter (cm):  0.8 Informed consent: discussed and consent obtained   Timeout: patient name, date of birth, surgical site, and procedure verified   Patient was prepped and draped in usual sterile fashion: area prepped with alcohol. Anesthesia: the lesion was anesthetized in a standard fashion   Anesthetic:  0.5% bupivicaine w/ epinephrine 1-100,000 local infiltration Instrument used: flexible razor blade   Hemostasis achieved with: pressure, aluminum chloride and electrodesiccation   Outcome: patient tolerated procedure well   Post-procedure details: wound care instructions given   Post-procedure details comment:  Ointment and a small bandage applied  Specimen 1 - Surgical pathology Differential Diagnosis: Nevus vs dysplastic nevus Check Margins: No  Nevus r/o dysplasia  Return in about 1 year (around 08/08/2021) for TBSE.   I, Ashok Cordia, CMA, am acting as scribe for Brendolyn Patty, MD .  Documentation: I have reviewed the above documentation for accuracy and completeness, and I agree with  the above.  Brendolyn Patty MD

## 2020-08-08 NOTE — Progress Notes (Signed)
Survivorship Care Plan visit completed.  Treatment summary reviewed and given to patient.  ASCO answers booklet reviewed and given to patient.  CARE program and Cancer Transitions discussed with patient along with other resources cancer center offers to patients and caregivers.  Patient verbalized understanding.    

## 2020-08-08 NOTE — Progress Notes (Signed)
Radiation Oncology Follow up Note  Name: Barbara [name]   Date:   08/08/2020 MRN:  [id number] DOB: 1950/06/19    This 70 y.o. female presents to the clinic today for 40-month follow-up status post whole breast radiation to right breast for stage Ia ER/PR positive invasive mammary carcinoma.  REFERRING PROVIDER: [name], MD  HPI: Patient is a 70 year old female now about 6 months having completed whole breast radiation to her right breast for stage Ia ER/PR positive invasive mammary carcinoma.  Seen today in routine follow-up.  She is doing well she specifically denies breast tenderness cough or bone pain..  She is currently on Arimidex tolerant well without side effect.  She has not yet had any breast imaging.  COMPLICATIONS OF TREATMENT: none  FOLLOW UP COMPLIANCE: keeps appointments   PHYSICAL EXAM:  BP 130/89 (BP Location: Left Arm, Patient Position: Sitting)   Pulse 80   Temp (!) 97.3 F (36.3 C) (Tympanic)   Resp 16   Wt 203 lb 4.8 oz (92.2 kg)   BMI 33.83 kg/m  Lungs are clear to A&P cardiac examination essentially unremarkable with regular rate and rhythm. No dominant mass or nodularity is noted in either breast in 2 positions examined. Incision is well-healed. No axillary or supraclavicular adenopathy is appreciated. Cosmetic result is excellent.  Well-developed well-nourished patient in NAD. HEENT reveals PERLA, EOMI, discs not visualized.  Oral cavity is clear. No oral mucosal lesions are identified. Neck is clear without evidence of cervical or supraclavicular adenopathy. Lungs are clear to A&P. Cardiac examination is essentially unremarkable with regular rate and rhythm without murmur rub or thrill. Abdomen is benign with no organomegaly or masses noted. Motor sensory and DTR levels are equal and symmetric in the upper and lower extremities. Cranial nerves II through XII are grossly intact. Proprioception is intact. No peripheral adenopathy or edema is identified. No  motor or sensory levels are noted. Crude visual fields are within normal range.  RADIOLOGY RESULTS: No current films for review  PLAN: Present time patient is doing well 6 months out with no evidence of disease.  I am pleased with her overall progress.  She continues on Arimidex without side effect.  Patient is to call with any concerns of asked to see her back in 6 months for follow-up.  I would like to take this opportunity to thank you for allowing me to participate in the care of your patient.Noreene Filbert, MD

## 2020-08-08 NOTE — Patient Instructions (Addendum)
Cryotherapy Aftercare  Wash gently with soap and water everyday.   Apply Vaseline and Band-Aid daily until healed.    Wound Care Instructions  Cleanse wound gently with soap and water once a day then pat dry with clean gauze. Apply a thing coat of Petrolatum (petroleum jelly, "Vaseline") over the wound (unless you have an allergy to this). We recommend that you use a new, sterile tube of Vaseline. Do not pick or remove scabs. Do not remove the yellow or white "healing tissue" from the base of the wound.  Cover the wound with fresh, clean, nonstick gauze and secure with paper tape. You may use Band-Aids in place of gauze and tape if the would is small enough, but would recommend trimming much of the tape off as there is often too much. Sometimes Band-Aids can irritate the skin.  You should call the office for your biopsy report after 1 week if you have not already been contacted.  If you experience any problems, such as abnormal amounts of bleeding, swelling, significant bruising, significant pain, or evidence of infection, please call the office immediately.  FOR ADULT SURGERY PATIENTS: If you need something for pain relief you may take 1 extra strength Tylenol (acetaminophen) AND 2 Ibuprofen (200mg each) together every 4 hours as needed for pain. (do not take these if you are allergic to them or if you have a reason you should not take them.) Typically, you may only need pain medication for 1 to 3 days.     If you have any questions or concerns for your doctor, please call our main line at [phone] and press option 4 to reach your doctor's medical assistant. If no one answers, please leave a voicemail as directed and we will return your call as soon as possible. Messages left after 4 pm will be answered the following business day.   You may also send us a message via MyChart. We typically respond to MyChart messages within 1-2 business days.  For prescription refills, please ask your  pharmacy to contact our office. Our fax number is [phone].  If you have an urgent issue when the clinic is closed that cannot wait until the next business day, you can page your doctor at the number below.    Please note that while we do our best to be available for urgent issues outside of office hours, we are not available 24/7.   If you have an urgent issue and are unable to reach us, you may choose to seek medical care at your doctor's office, retail clinic, urgent care center, or emergency room.  If you have a medical emergency, please immediately call 911 or go to the emergency department.  Pager Numbers  - Dr. Kowalski: [phone]  - Dr. Moye: [phone]  - Dr. Stewart: [phone]  In the event of inclement weather, please call our main line at [phone] for an update on the status of any delays or closures.  Dermatology Medication Tips: Please keep the boxes that topical medications come in in order to help keep track of the instructions about where and how to use these. Pharmacies typically print the medication instructions only on the boxes and not directly on the medication tubes.   If your medication is too expensive, please contact our office at [phone] option 4 or send us a message through MyChart.   We are unable to tell what your co-pay for medications will be in advance as this is different depending on your insurance coverage.   However, we may be able to find a substitute medication at lower cost or fill out paperwork to get insurance to cover a needed medication.   If a prior authorization is required to get your medication covered by your insurance company, please allow [place] 1-2 business days to complete this process.  Drug prices often vary depending on where the prescription is filled and some pharmacies may offer cheaper prices.  The website www.goodrx.com contains coupons for medications through different pharmacies. The prices here do not  account for what the cost may be with help from insurance (it may be cheaper with your insurance), but the website can give you the price if you did not use any insurance.  - You can print the associated coupon and take it with your prescription to the pharmacy.  - You may also stop by our office during regular business hours and pick up a GoodRx coupon card.  - If you need your prescription sent electronically to a different pharmacy, notify our office through [facility] or by phone at [phone] option 4.    Seborrheic Keratosis  What causes seborrheic keratoses? Seborrheic keratoses are harmless, common skin growths that first appear during adult life.  As time goes by, more growths appear.  Some people may develop a large number of them.  Seborrheic keratoses appear on both covered and uncovered body parts.  They are not caused by sunlight.  The tendency to develop seborrheic keratoses can be inherited.  They vary in color from skin-colored to gray, brown, or even black.  They can be either smooth or have a rough, warty surface.   Seborrheic keratoses are superficial and look as if they were stuck on the skin.  Under the microscope this type of keratosis looks like layers upon layers of skin.  That is why at times the top layer may seem to fall off, but the rest of the growth remains and re-grows.    Treatment Seborrheic keratoses do not need to be treated, but can easily be removed in the office.  Seborrheic keratoses often cause symptoms when they rub on clothing or jewelry.  Lesions can be in the way of shaving.  If they become inflamed, they can cause itching, soreness, or burning.  Removal of a seborrheic keratosis can be accomplished by freezing, burning, or surgery. If any spot bleeds, scabs, or grows rapidly, please return to have it checked, as these can be an indication of a skin cancer.

## 2020-08-26 ENCOUNTER — Telehealth: Payer: Self-pay

## 2020-08-26 NOTE — Telephone Encounter (Signed)
Scheduled pt for surgery 09/11/20 at 12:00/sh

## 2020-08-26 NOTE — Telephone Encounter (Signed)
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  - please call patient and schedule for excision.

## 2020-09-11 ENCOUNTER — Encounter: Payer: Medicare PPO | Admitting: Dermatology

## 2020-09-16 ENCOUNTER — Other Ambulatory Visit: Payer: Self-pay | Admitting: General Surgery

## 2020-09-16 DIAGNOSIS — Z853 Personal history of malignant neoplasm of breast: Secondary | ICD-10-CM

## 2020-09-18 ENCOUNTER — Other Ambulatory Visit: Payer: Self-pay

## 2020-09-18 ENCOUNTER — Ambulatory Visit: Payer: Medicare PPO | Admitting: Dermatology

## 2020-09-18 DIAGNOSIS — D0359 Melanoma in situ of other part of trunk: Secondary | ICD-10-CM | POA: Diagnosis not present

## 2020-09-18 MED ORDER — CALCIPOTRIENE-BETAMETH DIPROP 0.005-0.064 % EX SUSP: CUTANEOUS | 1 refills | Status: DC

## 2020-09-18 NOTE — Patient Instructions (Addendum)
Wound Care Instructions  Cleanse wound gently with soap and water once a day then pat dry with clean gauze. Apply a thing coat of Petrolatum (petroleum jelly, "Vaseline") over the wound (unless you have an allergy to this). We recommend that you use a new, sterile tube of Vaseline. Do not pick or remove scabs. Do not remove the yellow or white "healing tissue" from the base of the wound.  Cover the wound with fresh, clean, nonstick gauze and secure with paper tape. You may use Band-Aids in place of gauze and tape if the would is small enough, but would recommend trimming much of the tape off as there is often too much. Sometimes Band-Aids can irritate the skin.  You should call the office for your biopsy report after 1 week if you have not already been contacted.  If you experience any problems, such as abnormal amounts of bleeding, swelling, significant bruising, significant pain, or evidence of infection, please call the office immediately.  FOR ADULT SURGERY PATIENTS: If you need something for pain relief you may take 1 extra strength Tylenol (acetaminophen) AND 2 Ibuprofen (200mg each) together every 4 hours as needed for pain. (do not take these if you are allergic to them or if you have a reason you should not take them.) Typically, you may only need pain medication for 1 to 3 days.   If you have any questions or concerns for your doctor, please call our main line at [phone] and press option 4 to reach your doctor's medical assistant. If no one answers, please leave a voicemail as directed and we will return your call as soon as possible. Messages left after 4 pm will be answered the following business day.   You may also send us a message via MyChart. We typically respond to MyChart messages within 1-2 business days.  For prescription refills, please ask your pharmacy to contact our office. Our fax number is [phone].  If you have an urgent issue when the clinic is closed that  cannot wait until the next business day, you can page your doctor at the number below.    Please note that while we do our best to be available for urgent issues outside of office hours, we are not available 24/7.   If you have an urgent issue and are unable to reach us, you may choose to seek medical care at your doctor's office, retail clinic, urgent care center, or emergency room.  If you have a medical emergency, please immediately call 911 or go to the emergency department.  Pager Numbers  - Dr. Kowalski: [phone]  - Dr. Moye: [phone]  - Dr. Stewart: [phone]  In the event of inclement weather, please call our main line at [phone] for an update on the status of any delays or closures.  Dermatology Medication Tips: Please keep the boxes that topical medications come in in order to help keep track of the instructions about where and how to use these. Pharmacies typically print the medication instructions only on the boxes and not directly on the medication tubes.   If your medication is too expensive, please contact our office at [phone] option 4 or send us a message through MyChart.   We are unable to tell what your co-pay for medications will be in advance as this is different depending on your insurance coverage. However, we may be able to find a substitute medication at lower cost or fill out paperwork to get insurance to cover a needed   medication.   If a prior authorization is required to get your medication covered by your insurance company, please allow us 1-2 business days to complete this process.  Drug prices often vary depending on where the prescription is filled and some pharmacies may offer cheaper prices.  The website www.goodrx.com contains coupons for medications through different pharmacies. The prices here do not account for what the cost may be with help from insurance (it may be cheaper with your insurance), but the website can give you the  price if you did not use any insurance.  - You can print the associated coupon and take it with your prescription to the pharmacy.  - You may also stop by our office during regular business hours and pick up a GoodRx coupon card.  - If you need your prescription sent electronically to a different pharmacy, notify our office through [facility] MyChart or by phone at [phone] option 4.   

## 2020-09-18 NOTE — Progress Notes (Signed)
   Follow-Up Visit   Subjective  Barbara [name] is a 70 y.o. female who presents for the following: Melanoma in situ (Right lateral mid back. Patient presents for excision.).   The following portions of the chart were reviewed this encounter and updated as appropriate:       Review of Systems:  No other skin or systemic complaints except as noted in HPI or Assessment and Plan.  Objective  Well appearing patient in no apparent distress; mood and affect are within normal limits.  A focused examination was performed including back. Relevant physical exam findings are noted in the Assessment and Plan.  Right Lateral Mid Back Pink biopsy site.   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  Skin excision  Lesion length (cm):  1.1 Lesion width (cm):  0.5 Margin per side (cm):  0.5 Total excision diameter (cm):  2.1 Informed consent: discussed and consent obtained   Timeout: patient name, date of birth, surgical site, and procedure verified   Procedure prep:  Patient was prepped and draped in usual sterile fashion Prep type:  Povidone-iodine Anesthesia: the lesion was anesthetized in a standard fashion   Anesthesia comment:  19cc = 9.0 cc lido w/epi, 10.0 cc 0.5% bupivicaine Anesthetic:  1% lidocaine w/ epinephrine 1-100,000 buffered w/ 8.4% NaHCO3 (0.5% bupivicaine) Instrument used: #15 blade   Hemostasis achieved with: pressure and electrodesiccation   Outcome: patient tolerated procedure well with no complications   Additional details:  Tag at posterior 12 o'clock tip.  Skin repair Complexity:  Complex Final length (cm):  5.3 Informed consent: discussed and consent obtained   Timeout: patient name, date of birth, surgical site, and procedure verified   Reason for type of repair: reduce tension to allow closure, reduce the risk of dehiscence, infection, and necrosis, reduce subcutaneous dead space and avoid a hematoma, preserve  normal anatomical and functional relationships and enhance both functionality and cosmetic results   Undermining: area extensively undermined   Undermining comment:  2.1 cm Subcutaneous layers (deep stitches):  Suture size:  3-0 Suture type: Vicryl (polyglactin 910)   Stitches:  Buried vertical mattress Fine/surface layer approximation (top stitches):  Suture size:  4-0 Suture type: nylon   Stitches: simple interrupted   Suture removal (days):  7 Hemostasis achieved with: suture and pressure Outcome: patient tolerated procedure well with no complications   Post-procedure details: sterile dressing applied and wound care instructions given   Dressing type: pressure dressing (mupirocin)    Specimen 1 - Surgical pathology Differential Diagnosis: Melanoma in situ, biopsy proven Check Margins: Yes Pink biopsy site. [id number] Tag at posterior 12 o'clock tip.  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  IJamesetta Orleans, CMA, am acting as scribe for Brendolyn Patty, MD .  Documentation: I have reviewed the above documentation for accuracy and completeness, and I agree with the above.  Brendolyn Patty MD

## 2020-09-19 ENCOUNTER — Telehealth: Payer: Self-pay

## 2020-09-19 ENCOUNTER — Inpatient Hospital Stay: Payer: Medicare PPO

## 2020-09-19 ENCOUNTER — Inpatient Hospital Stay: Payer: Medicare PPO | Attending: Oncology

## 2020-09-19 VITALS — BP 134/70 | HR 85 | Temp 97.0°F | Resp 18

## 2020-09-19 DIAGNOSIS — Z803 Family history of malignant neoplasm of breast: Secondary | ICD-10-CM | POA: Diagnosis not present

## 2020-09-19 DIAGNOSIS — Z801 Family history of malignant neoplasm of trachea, bronchus and lung: Secondary | ICD-10-CM | POA: Insufficient documentation

## 2020-09-19 DIAGNOSIS — Z809 Family history of malignant neoplasm, unspecified: Secondary | ICD-10-CM | POA: Diagnosis not present

## 2020-09-19 DIAGNOSIS — M8580 Other specified disorders of bone density and structure, unspecified site: Secondary | ICD-10-CM

## 2020-09-19 DIAGNOSIS — Z170 Estrogen receptor positive status [ER+]: Secondary | ICD-10-CM | POA: Diagnosis not present

## 2020-09-19 DIAGNOSIS — Z800 Family history of malignant neoplasm of digestive organs: Secondary | ICD-10-CM | POA: Diagnosis not present

## 2020-09-19 DIAGNOSIS — C50211 Malignant neoplasm of upper-inner quadrant of right female breast: Secondary | ICD-10-CM | POA: Insufficient documentation

## 2020-09-19 DIAGNOSIS — Z79899 Other long term (current) drug therapy: Secondary | ICD-10-CM | POA: Diagnosis not present

## 2020-09-19 LAB — COMPREHENSIVE METABOLIC PANEL
ALT: 19 U/L (ref 0–44)
AST: 17 U/L (ref 15–41)
Albumin: 4 g/dL (ref 3.5–5.0)
Alkaline Phosphatase: 68 U/L (ref 38–126)
Anion gap: 8 (ref 5–15)
BUN: 15 mg/dL (ref 8–23)
CO2: 27 mmol/L (ref 22–32)
Calcium: 9.7 mg/dL (ref 8.9–10.3)
Chloride: 104 mmol/L (ref 98–111)
Creatinine, Ser: 0.96 mg/dL (ref 0.44–1.00)
GFR, Estimated: >60 mL/min (ref >60)
Glucose, Bld: 138 mg/dL — ABNORMAL HIGH (ref 70–99)
Potassium: 3.5 mmol/L (ref 3.5–5.1)
Sodium: 139 mmol/L (ref 135–145)
Total Bilirubin: 1.8 mg/dL — ABNORMAL HIGH (ref 0.3–1.2)
Total Protein: 6.9 g/dL (ref 6.5–8.1)

## 2020-09-19 LAB — CBC WITH DIFFERENTIAL/PLATELET
Abs Immature Granulocytes: 0.03 10*3/uL (ref 0.00–0.07)
Basophils Absolute: 0 10*3/uL (ref 0.0–0.1)
Basophils Relative: 1 %
Eosinophils Absolute: 0.1 10*3/uL (ref 0.0–0.5)
Eosinophils Relative: 2 %
HCT: 39.7 % (ref 36.0–46.0)
Hemoglobin: 13.5 g/dL (ref 12.0–15.0)
Immature Granulocytes: 1 %
Lymphocytes Relative: 25 %
Lymphs Abs: 1.3 10*3/uL (ref 0.7–4.0)
MCH: 28.2 pg (ref 26.0–34.0)
MCHC: 34 g/dL (ref 30.0–36.0)
MCV: 82.9 fL (ref 80.0–100.0)
Monocytes Absolute: 0.3 10*3/uL (ref 0.1–1.0)
Monocytes Relative: 6 %
Neutro Abs: 3.5 10*3/uL (ref 1.7–7.7)
Neutrophils Relative %: 65 %
Platelets: 167 10*3/uL (ref 150–400)
RBC: 4.79 MIL/uL (ref 3.87–5.11)
RDW: 14 % (ref 11.5–15.5)
WBC: 5.3 10*3/uL (ref 4.0–10.5)
nRBC: 0 % (ref 0.0–0.2)

## 2020-09-19 MED ORDER — ZOLEDRONIC ACID 4 MG/100ML IV SOLN
4.0000 mg | Freq: Once | INTRAVENOUS | Status: AC
  Administered 2020-09-19: 4 mg via INTRAVENOUS
  Filled 2020-09-19: qty 100

## 2020-09-19 MED ORDER — SODIUM CHLORIDE 0.9 % IV SOLN
Freq: Once | INTRAVENOUS | Status: AC
  Filled 2020-09-19: qty 250

## 2020-09-19 NOTE — Patient Instructions (Signed)
[facility]  Discharge Instructions: Thank you for choosing [facility] to provide your oncology and hematology care.  If you have a lab appointment with the [facility], please go directly to the [facility] and check in at the registration area.  Wear comfortable clothing and clothing appropriate for easy access to any Portacath or PICC line.   We strive to give you quality time with your provider. You may need to reschedule your appointment if you arrive late (15 or more minutes).  Arriving late affects you and other patients whose appointments are after yours.  Also, if you miss three or more appointments without notifying the office, you may be dismissed from the clinic at the provider's discretion.      For prescription refill requests, have your pharmacy contact our office and allow 72 hours for refills to be completed.    Today you received the following chemotherapy and/or immunotherapy agents ZOMETA      To help prevent nausea and vomiting after your treatment, we encourage you to take your nausea medication as directed.  BELOW ARE SYMPTOMS THAT SHOULD BE REPORTED IMMEDIATELY: *FEVER GREATER THAN 100.4 F (38 C) OR HIGHER *CHILLS OR SWEATING *NAUSEA AND VOMITING THAT IS NOT CONTROLLED WITH YOUR NAUSEA MEDICATION *UNUSUAL SHORTNESS OF BREATH *UNUSUAL BRUISING OR BLEEDING *URINARY PROBLEMS (pain or burning when urinating, or frequent urination) *BOWEL PROBLEMS (unusual diarrhea, constipation, pain near the anus) TENDERNESS IN MOUTH AND THROAT WITH OR WITHOUT PRESENCE OF ULCERS (sore throat, sores in mouth, or a toothache) UNUSUAL RASH, SWELLING OR PAIN  UNUSUAL VAGINAL DISCHARGE OR ITCHING   Items with * indicate a potential emergency and should be followed up as soon as possible or go to the Emergency Department if any problems should occur.  Please show the CHEMOTHERAPY ALERT CARD or IMMUNOTHERAPY ALERT CARD at check-in to  the Emergency Department and triage nurse.  Should you have questions after your visit or need to cancel or reschedule your appointment, please contact [place]  [phone] and follow the prompts.  Office hours are 8:00 a.m. to 4:30 p.m. Monday - Friday. Please note that voicemails left after 4:00 p.m. may not be returned until the following business day.  We are closed weekends and major holidays. You have access to a nurse at all times for urgent questions. Please call the main number to the clinic [phone] and follow the prompts.  For any non-urgent questions, you may also contact your provider using MyChart. We now offer e-Visits for anyone 94 and older to request care online for non-urgent symptoms. For details visit [email or web address].   Also download the MyChart app! Go to the app store, search "MyChart", open the app, select [facility], and log in with your MyChart username and password.  Zoledronic Acid Injection (Hypercalcemia, Oncology) What is this medication? ZOLEDRONIC ACID (ZOE le dron ik AS id) slows calcium loss from bones. It high calcium levels in the blood from some kinds of cancer. It may be used in otherpeople at risk for bone loss. This medicine may be used for other purposes; ask your health care provider orpharmacist if you have questions. COMMON BRAND NAME(S): Zometa What should I tell my care team before I take this medication? They need to know if you have any of these conditions: cancer dehydration dental disease kidney disease liver disease low levels of calcium in the blood lung or breathing disease (asthma) receiving steroids like dexamethasone or prednisone  an unusual or allergic reaction to zoledronic acid, other medicines, foods, dyes, or preservatives pregnant or trying to get pregnant breast-feeding How should I use this medication? This drug is injected into a vein. It is given by a health care  provider in [place] or clinic setting. Talk to your health care provider about the use of this drug in children.Special care may be needed. Overdosage: If you think you have taken too much of this medicine contact apoison control center or emergency room at once. NOTE: This medicine is only for you. Do not share this medicine with others. What if I miss a dose? Keep appointments for follow-up doses. It is important not to miss your dose.Call your health care provider if you are unable to keep an appointment. What may interact with this medication? certain antibiotics given by injection NSAIDs, medicines for pain and inflammation, like ibuprofen or naproxen some diuretics like bumetanide, furosemide teriparatide thalidomide This list may not describe all possible interactions. Give your health care provider a list of all the medicines, herbs, non-prescription drugs, or dietary supplements you use. Also tell them if you smoke, drink alcohol, or use illegaldrugs. Some items may interact with your medicine. What should I watch for while using this medication? Visit your health care provider for regular checks on your progress. It may besome time before you see the benefit from this drug. Some people who take this drug have severe bone, joint, or muscle pain. This drug may also increase your risk for jaw problems or a broken thigh bone. Tell your health care provider right away if you have severe pain in your jaw, bones, joints, or muscles. Tell you health care provider if you have any painthat does not go away or that gets worse. Tell your dentist and dental surgeon that you are taking this drug. You should not have major dental surgery while on this drug. See your dentist to have a dental exam and fix any dental problems before starting this drug. Take good care of your teeth while on this drug. Make sure you see your dentist forregular follow-up appointments. You should make sure you get enough  calcium and vitamin D while you are taking this drug. Discuss the foods you eat and the vitamins you take with your healthcare provider. Check with your health care provider if you have severe diarrhea, nausea, and vomiting, or if you sweat a lot. The loss of too much body fluid may make itdangerous for you to take this drug. You may need blood work done while you are taking this drug. Do not become pregnant while taking this drug. Women should inform their health care provider if they wish to become pregnant or think they might be pregnant. There is potential for serious harm to an unborn child. Talk to your healthcare provider for more information. What side effects may I notice from receiving this medication? Side effects that you should report to your doctor or health care provider assoon as possible: allergic reactions (skin rash, itching or hives; swelling of the face, lips, or tongue) bone pain infection (fever, chills, cough, sore throat, pain or trouble passing urine) jaw pain, especially after dental work joint pain kidney injury (trouble passing urine or change in the amount of urine) low blood pressure (dizziness; feeling faint or lightheaded, falls; unusually weak or tired) low calcium levels (fast heartbeat; muscle cramps or pain; pain, tingling, or numbness in the hands or feet; seizures) low magnesium levels (fast, irregular heartbeat; muscle cramp or  pain; muscle weakness; tremors; seizures) low red blood cell counts (trouble breathing; feeling faint; lightheaded, falls; unusually weak or tired) muscle pain redness, blistering, peeling, or loosening of the skin, including inside the mouth severe diarrhea swelling of the ankles, feet, hands trouble breathing Side effects that usually do not require medical attention (report to yourdoctor or health care provider if they continue or are bothersome): anxious constipation coughing depressed mood eye irritation, itching, or  pain fever general ill feeling or flu-like symptoms nausea pain, redness, or irritation at site where injected trouble sleeping This list may not describe all possible side effects. Call your doctor for medical advice about side effects. You may report side effects to FDA at1-800-FDA-1088. Where should I keep my medication? This drug is given in a hospital or clinic. It will not be stored at home. NOTE: This sheet is a summary. It may not cover all possible information. If you have questions about this medicine, talk to your doctor, pharmacist, orhealth care provider.  2022 [employer]vier/Gold Standard (2018-11-03 09:13:00)   Due to Covid, a mask is required upon entering the hospital/clinic. If you do not have a mask, one will be given to you upon arrival. For doctor visits, patients may have 1 support person aged 62 or older with them. For treatment visits, patients cannot have anyone with them due to current Covid guidelines and our immunocompromised population.

## 2020-09-19 NOTE — Telephone Encounter (Signed)
Left pt msg to call if any problems after yesterday's surgery./sh 

## 2020-09-25 ENCOUNTER — Ambulatory Visit (INDEPENDENT_AMBULATORY_CARE_PROVIDER_SITE_OTHER): Payer: Medicare PPO | Admitting: Dermatology

## 2020-09-25 ENCOUNTER — Other Ambulatory Visit: Payer: Self-pay

## 2020-09-25 DIAGNOSIS — D0359 Melanoma in situ of other part of trunk: Secondary | ICD-10-CM

## 2020-09-25 DIAGNOSIS — L820 Inflamed seborrheic keratosis: Secondary | ICD-10-CM | POA: Diagnosis not present

## 2020-09-25 NOTE — Patient Instructions (Signed)

## 2020-09-25 NOTE — Progress Notes (Signed)
   Follow-Up Visit   Subjective  Barbara [name] is a 70 y.o. female who presents for the following: Post op (Melanoma in situ, margins free. Right lateral mid back. ) and Inflamed SKs (Back, left axilla, irritating, treat today.).   The following portions of the chart were reviewed this encounter and updated as appropriate:       Review of Systems:  No other skin or systemic complaints except as noted in HPI or Assessment and Plan.  Objective  Well appearing patient in no apparent distress; mood and affect are within normal limits.  A focused examination was performed including face, back, axilla. Relevant physical exam findings are noted in the Assessment and Plan.  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  Wound cleansed, sutures removed, wound cleansed and steri strips applied. Discussed pathology results.   Inflamed seborrheic keratosis Left Ant Axilla x 1, L mid back x 1  Destruction of lesion - Left Ant Axilla x 1, L mid back x 1  Destruction method: cryotherapy   Informed consent: discussed and consent obtained   Lesion destroyed using liquid nitrogen: Yes   Region frozen until ice ball extended beyond lesion: Yes   Outcome: patient tolerated procedure well with no complications   Post-procedure details: wound care instructions given   Additional details:  Prior to procedure, discussed risks of blister formation, small wound, skin dyspigmentation, or rare scar following cryotherapy. Recommend Vaseline ointment to treated areas while healing.   Return in about 6 months (around 03/28/2021), or Hx melanoma, for TBSE.  IJamesetta Orleans, CMA, am acting as scribe for Brendolyn Patty, MD .  Documentation: I have reviewed the above documentation for  accuracy and completeness, and I agree with the above.  Brendolyn Patty MD

## 2020-10-30 ENCOUNTER — Other Ambulatory Visit: Payer: Self-pay

## 2020-10-30 ENCOUNTER — Ambulatory Visit
Admission: RE | Admit: 2020-10-30 | Discharge: 2020-10-30 | Disposition: A | Discharge status: Home / Self Care | Payer: Medicare PPO | Source: Ambulatory Visit | Attending: General Surgery | Admitting: General Surgery

## 2020-10-30 ENCOUNTER — Other Ambulatory Visit: Payer: Self-pay | Admitting: Nurse Practitioner

## 2020-10-30 ENCOUNTER — Ambulatory Visit
Admission: RE | Admit: 2020-10-30 | Discharge: 2020-10-30 | Disposition: A | Discharge status: Home / Self Care | Payer: Medicare PPO | Source: Ambulatory Visit | Attending: Nurse Practitioner | Admitting: Nurse Practitioner

## 2020-10-30 DIAGNOSIS — Z853 Personal history of malignant neoplasm of breast: Secondary | ICD-10-CM

## 2020-10-30 DIAGNOSIS — C50211 Malignant neoplasm of upper-inner quadrant of right female breast: Secondary | ICD-10-CM | POA: Insufficient documentation

## 2020-10-30 DIAGNOSIS — Z170 Estrogen receptor positive status [ER+]: Secondary | ICD-10-CM | POA: Insufficient documentation

## 2020-10-30 DIAGNOSIS — R928 Other abnormal and inconclusive findings on diagnostic imaging of breast: Secondary | ICD-10-CM

## 2020-11-04 ENCOUNTER — Telehealth: Payer: Self-pay

## 2020-11-04 NOTE — Telephone Encounter (Signed)
-----   Message from Earlie Server, MD sent at 11/01/2020  9:51 PM EDT ----- She needs stereotactic guided biopsy of left breast distortion

## 2020-11-04 NOTE — Telephone Encounter (Signed)
Patient has been scheduled for biopsy on 10/5

## 2020-11-06 ENCOUNTER — Ambulatory Visit
Admission: RE | Admit: 2020-11-06 | Discharge: 2020-11-06 | Disposition: A | Discharge status: Home / Self Care | Payer: Medicare PPO | Source: Ambulatory Visit | Attending: Nurse Practitioner | Admitting: Nurse Practitioner

## 2020-11-06 ENCOUNTER — Ambulatory Visit
Admission: RE | Admit: 2020-11-06 | Discharge: 2020-11-06 | Disposition: A | Discharge status: Home / Self Care | Payer: Medicare PPO | Source: Ambulatory Visit | Attending: General Surgery | Admitting: General Surgery

## 2020-11-06 ENCOUNTER — Other Ambulatory Visit: Payer: Self-pay

## 2020-11-06 ENCOUNTER — Other Ambulatory Visit: Payer: Self-pay | Admitting: General Surgery

## 2020-11-06 ENCOUNTER — Other Ambulatory Visit: Payer: Self-pay | Admitting: Nurse Practitioner

## 2020-11-06 DIAGNOSIS — R928 Other abnormal and inconclusive findings on diagnostic imaging of breast: Secondary | ICD-10-CM

## 2020-11-06 HISTORY — PX: BREAST BIOPSY: SHX20

## 2020-11-08 LAB — SURGICAL PATHOLOGY

## 2020-12-04 ENCOUNTER — Other Ambulatory Visit: Payer: Self-pay

## 2020-12-04 DIAGNOSIS — C50211 Malignant neoplasm of upper-inner quadrant of right female breast: Secondary | ICD-10-CM

## 2020-12-04 DIAGNOSIS — Z170 Estrogen receptor positive status [ER+]: Secondary | ICD-10-CM

## 2020-12-06 ENCOUNTER — Inpatient Hospital Stay: Payer: Medicare PPO | Attending: Oncology

## 2020-12-06 ENCOUNTER — Encounter: Payer: Self-pay | Admitting: Oncology

## 2020-12-06 ENCOUNTER — Inpatient Hospital Stay (HOSPITAL_BASED_OUTPATIENT_CLINIC_OR_DEPARTMENT_OTHER): Payer: Medicare PPO | Admitting: Oncology

## 2020-12-06 ENCOUNTER — Other Ambulatory Visit: Payer: Self-pay

## 2020-12-06 VITALS — BP 133/74 | HR 73 | Temp 97.5°F | Wt 206.5 lb

## 2020-12-06 DIAGNOSIS — Z85820 Personal history of malignant melanoma of skin: Secondary | ICD-10-CM | POA: Insufficient documentation

## 2020-12-06 DIAGNOSIS — Z170 Estrogen receptor positive status [ER+]: Secondary | ICD-10-CM

## 2020-12-06 DIAGNOSIS — Z9049 Acquired absence of other specified parts of digestive tract: Secondary | ICD-10-CM | POA: Diagnosis not present

## 2020-12-06 DIAGNOSIS — Z801 Family history of malignant neoplasm of trachea, bronchus and lung: Secondary | ICD-10-CM | POA: Insufficient documentation

## 2020-12-06 DIAGNOSIS — Z809 Family history of malignant neoplasm, unspecified: Secondary | ICD-10-CM | POA: Insufficient documentation

## 2020-12-06 DIAGNOSIS — N6091 Unspecified benign mammary dysplasia of right breast: Secondary | ICD-10-CM | POA: Insufficient documentation

## 2020-12-06 DIAGNOSIS — N6042 Mammary duct ectasia of left breast: Secondary | ICD-10-CM | POA: Insufficient documentation

## 2020-12-06 DIAGNOSIS — Z79899 Other long term (current) drug therapy: Secondary | ICD-10-CM | POA: Diagnosis not present

## 2020-12-06 DIAGNOSIS — C50211 Malignant neoplasm of upper-inner quadrant of right female breast: Secondary | ICD-10-CM | POA: Insufficient documentation

## 2020-12-06 DIAGNOSIS — N6022 Fibroadenosis of left breast: Secondary | ICD-10-CM | POA: Diagnosis not present

## 2020-12-06 DIAGNOSIS — Z79811 Long term (current) use of aromatase inhibitors: Secondary | ICD-10-CM

## 2020-12-06 DIAGNOSIS — Z800 Family history of malignant neoplasm of digestive organs: Secondary | ICD-10-CM | POA: Diagnosis not present

## 2020-12-06 DIAGNOSIS — M8580 Other specified disorders of bone density and structure, unspecified site: Secondary | ICD-10-CM

## 2020-12-06 DIAGNOSIS — Z803 Family history of malignant neoplasm of breast: Secondary | ICD-10-CM | POA: Insufficient documentation

## 2020-12-06 DIAGNOSIS — Z8049 Family history of malignant neoplasm of other genital organs: Secondary | ICD-10-CM | POA: Insufficient documentation

## 2020-12-06 DIAGNOSIS — Z923 Personal history of irradiation: Secondary | ICD-10-CM | POA: Diagnosis not present

## 2020-12-06 LAB — COMPREHENSIVE METABOLIC PANEL
ALT: 18 U/L (ref 0–44)
AST: 15 U/L (ref 15–41)
Albumin: 4 g/dL (ref 3.5–5.0)
Alkaline Phosphatase: 61 U/L (ref 38–126)
Anion gap: 8 (ref 5–15)
BUN: 18 mg/dL (ref 8–23)
CO2: 25 mmol/L (ref 22–32)
Calcium: 9.4 mg/dL (ref 8.9–10.3)
Chloride: 104 mmol/L (ref 98–111)
Creatinine, Ser: 0.97 mg/dL (ref 0.44–1.00)
GFR, Estimated: >60 mL/min (ref >60)
Glucose, Bld: 113 mg/dL — ABNORMAL HIGH (ref 70–99)
Potassium: 3.8 mmol/L (ref 3.5–5.1)
Sodium: 137 mmol/L (ref 135–145)
Total Bilirubin: 1.5 mg/dL — ABNORMAL HIGH (ref 0.3–1.2)
Total Protein: 7 g/dL (ref 6.5–8.1)

## 2020-12-06 LAB — CBC WITH DIFFERENTIAL/PLATELET
Abs Immature Granulocytes: 0.03 10*3/uL (ref 0.00–0.07)
Basophils Absolute: 0 10*3/uL (ref 0.0–0.1)
Basophils Relative: 1 %
Eosinophils Absolute: 0.1 10*3/uL (ref 0.0–0.5)
Eosinophils Relative: 2 %
HCT: 39.3 % (ref 36.0–46.0)
Hemoglobin: 13.2 g/dL (ref 12.0–15.0)
Immature Granulocytes: 1 %
Lymphocytes Relative: 21 %
Lymphs Abs: 1.2 10*3/uL (ref 0.7–4.0)
MCH: 27.7 pg (ref 26.0–34.0)
MCHC: 33.6 g/dL (ref 30.0–36.0)
MCV: 82.4 fL (ref 80.0–100.0)
Monocytes Absolute: 0.4 10*3/uL (ref 0.1–1.0)
Monocytes Relative: 7 %
Neutro Abs: 4 10*3/uL (ref 1.7–7.7)
Neutrophils Relative %: 68 %
Platelets: 180 10*3/uL (ref 150–400)
RBC: 4.77 MIL/uL (ref 3.87–5.11)
RDW: 13.9 % (ref 11.5–15.5)
WBC: 5.9 10*3/uL (ref 4.0–10.5)
nRBC: 0 % (ref 0.0–0.2)

## 2020-12-06 NOTE — Progress Notes (Signed)
Hematology/Oncology Consult note [facility] [phone] [phone]   Patient Care Team: [name], MD as PCP - General (Family Medicine) Earlie Server, MD as Consulting Physician (Oncology) Noreene Filbert, MD as Referring Physician (Radiation Oncology) Herbert Pun, MD as Consulting Physician (General Surgery)  REFERRING PROVIDER: [name], MD  CHIEF COMPLAINTS/REASON FOR VISIT:  Follow up for breast cancer  HISTORY OF PRESENTING ILLNESS:   Barbara [name] is a  70 y.o.  female with PMH listed below was seen in consultation at the request of  [name], MD  for evaluation of breast cancer  10/13/2019 diagnostic mammogram  Showed 41mm spiculated mass in the right breast 1:00. Unchanged left axillary probabaly benign mass- 1 year stability.  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 ER 90% positive, PR 1-0% positive, HER2 negative.  Patient denies any breast skin changes or nipple discharge  Family history of breast cancer: Ethlyn Daniels, and Fraser Din first cousin Family history of other cancers: lung cancer in mother, pancreatic cancer in father, unknown cancer in pat uncle.   Menarche: 57 or 24 Menopause: age of 19 Number of pregnancies : 2 Age at first live childbirth:2 Used OCP: remote use of OCP for couple of years Used estrogen and progesterone therapy: denies History of Radiation to the chest: denies Previous of breast biopsy: previous biopsies in 1970s  # 10/30/2019, patient underwent right lumpectomy and sentinel lymph node biopsy.  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 4 mm pT1a pN0 tubular carcinoma, grade 1. #01/18/2020, finished  adjuvant radiation. #January 2022, started on Arimidex  # Family history of pancreatic cancer and breast cancer.  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   INTERVAL HISTORY Barbara [name] is a 70 y.o. female who has above history reviewed by me today presents for follow up visit for management of right breast cancer. Problems and complaints are listed below: Patient reports feeling well today.  She has been on Arimidex and so far tolerating well. Denies any new concerns today.  10/30/2020, bilateral diagnostic mammogram showed a subtle left breast architectural distortion without suspicious sonographic correlate. 11/06/2020, left breast upper inner stereotactic biopsy showed benign breast tissue with fibrocystic changes with ductal ectasia.  Xarelto endolitis stromal hyperplasia, usual ductal hyperplasia.  Negative for atypia and malignancy.  Left breast upper inner posterior stereotactic biopsy showed benign breast tissue with sclerosing adenosis.  Fibrocystic changes.  Negative for atypia and malignancy.   Review of Systems  Constitutional:  Negative for appetite change, chills, fatigue and fever.  HENT:   Negative for hearing loss and voice change.   Eyes:  Negative for eye problems.  Respiratory:  Negative for chest tightness and cough.   Cardiovascular:  Negative for chest pain.  Gastrointestinal:  Negative for abdominal distention, abdominal pain and blood in stool.  Endocrine: Negative for hot flashes.  Genitourinary:  Negative for difficulty urinating and frequency.   Musculoskeletal:  Negative for arthralgias.  Skin:  Negative for itching and rash.  Neurological:  Negative for extremity weakness.  Hematological:  Negative for adenopathy.  Psychiatric/Behavioral:  Negative for confusion.    MEDICAL HISTORY:  Past Medical History:  Diagnosis Date   [place]cer [place]  Family history of breast cancer     Family history of lung cancer    Family history of pancreatic cancer    Family history of uterine cancer    GERD (gastroesophageal reflux disease)    Headache    migraines   Hypertension    Melanoma ([place]) 08/08/2020   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, R lat mid back, pt schedueld for exc 09/11/20   Osteopenia 02/15/2020   Pre-diabetes    Sleep apnea    uses cpap    SURGICAL HISTORY: Past Surgical History:  Procedure Laterality Date   APPENDECTOMY     BREAST BIOPSY Right 10/20/2019   Affirm bx-"X" clip path pending   BREAST BIOPSY Left 11/06/2020   stereo bx, x clip, path pending   BREAST BIOPSY Left 11/06/2020   Stereo bx-Distortion #2-"Coil" clip-path pending   BREAST CYST EXCISION Right    BREAST CYST EXCISION Right    BREAST CYST EXCISION Left    BREAST SURGERY     CHOLECYSTECTOMY     PART MASTECTOMY,RADIO FREQUENCY LOCALIZER,AXILLARY SENTINEL NODE BIOPSY Right 10/30/2019   Procedure: PART MASTECTOMY,RADIO FREQUENCY LOCALIZER,AXILLARY SENTINEL NODE BIOPSY;  Surgeon: Herbert Pun, MD;  Location: [facility] ORS;  Service: General;  Laterality: Right;    SOCIAL HISTORY: Social History   Socioeconomic History   Marital status: Married    Spouse name: Not on file   Number of children: Not on file   Years of education: Not on file   Highest education level: Not on file  Occupational History   Not on file  Tobacco Use   Smoking status: Never   Smokeless tobacco: Never  Vaping Use   Vaping Use: Never used  Substance and Sexual Activity   Alcohol use: No   Drug use: No   Sexual activity: Not on file  Other Topics Concern   Not on file  Social History Narrative   Not on file   Social Determinants of Health   Financial Resource Strain: Not on file  Food Insecurity: Not on file  Transportation Needs: Not on file  Physical Activity: Not on file  Stress: Not on file  Social Connections: Not on file  Intimate Partner Violence: Not on file    FAMILY  HISTORY: Family History  Problem Relation Age of Onset   Breast cancer Paternal Aunt    Lung cancer Mother    Pancreatic cancer Father    Cancer Paternal Uncle        unk type   Cancer Paternal Aunt        unk type, possibly breast   Breast cancer Cousin    Cancer Cousin        unk type    ALLERGIES:  has No Known Allergies.  MEDICATIONS:  Current Outpatient Medications  Medication Sig Dispense Refill   amLODipine (NORVASC) 5 MG tablet Take 5 mg by mouth at bedtime.      anastrozole (ARIMIDEX) 1 MG tablet Take 1 tablet by mouth once daily 90 tablet 1   aspirin 81 MG EC tablet Take 81 mg by mouth daily.      azelastine (ASTELIN) 0.1 % nasal spray Place into the nose.     calcipotriene-betamethasone (TACLONEX) external suspension Apply to affected area ears at night until improved. 60 g 1   Calcium Carbonate-Vit D-Min (CALCIUM 1200 PO) Take by mouth.     cetirizine (ZYRTEC) 10 MG tablet Take 10 mg by mouth every morning.      Cholecalciferol (VITAMIN D) 50  MCG (2000 UT) CAPS Take 2,000 Units by mouth daily.     CINNAMON PO Take 1,000 mg by mouth in the morning and at bedtime.      clobetasol cream (TEMOVATE) 4.81 % Apply 1 application topically 2 (two) times daily. 45 g 1   Krill Oil 500 MG CAPS Take 500 mg by mouth daily.     lovastatin (MEVACOR) 40 MG tablet Take 40 mg by mouth at bedtime.      meloxicam (MOBIC) 15 MG tablet Take 15 mg by mouth daily as needed for pain.     mometasone (ELOCON) 0.1 % cream Apply 1 application topically daily as needed (ear irritation).     pantoprazole (PROTONIX) 40 MG tablet Take 40 mg by mouth every morning.      No current facility-administered medications for this visit.     PHYSICAL EXAMINATION: ECOG PERFORMANCE STATUS: 0 - Asymptomatic Vitals:   12/06/20 1200  BP: 133/74  Pulse: 73  Temp: (!) 97.5 F (36.4 C)   Filed Weights   12/06/20 1200  Weight: 206 lb 8 oz (93.7 kg)    Physical Exam Constitutional:      General: She  is not in acute distress. HENT:     Head: Normocephalic and atraumatic.  Eyes:     General: No scleral icterus. Cardiovascular:     Rate and Rhythm: Normal rate and regular rhythm.     Heart sounds: Normal heart sounds.  Pulmonary:     Effort: Pulmonary effort is normal. No respiratory distress.     Breath sounds: No wheezing.  Abdominal:     General: Bowel sounds are normal. There is no distension.     Palpations: Abdomen is soft.  Musculoskeletal:        General: No deformity. Normal range of motion.     Cervical back: Normal range of motion and neck supple.  Skin:    General: Skin is warm and dry.     Findings: No erythema or rash.  Neurological:     Mental Status: She is alert and oriented to person, place, and time. Mental status is at baseline.     Cranial Nerves: No cranial nerve deficit.     Coordination: Coordination normal.  Psychiatric:        Mood and Affect: Mood normal.    LABORATORY DATA:  I have reviewed the data as listed Lab Results  Component Value Date   WBC 5.9 12/06/2020   HGB 13.2 12/06/2020   HCT 39.3 12/06/2020   MCV 82.4 12/06/2020   PLT 180 12/06/2020   Recent Labs    08/02/20 1249 09/19/20 1328 12/06/20 1149  NA 139 139 137  K 3.7 3.5 3.8  CL 103 104 104  CO2 $Re'28 27 25  'kNM$ GLUCOSE 135* 138* 113*  BUN $Re'21 15 18  'bbX$ CREATININE 1.19* 0.96 0.97  CALCIUM 9.4 9.7 9.4  GFRNONAA 49* >60 >60  PROT 6.9 6.9 7.0  ALBUMIN 3.9 4.0 4.0  AST $Re'16 17 15  'ztK$ ALT $R'20 19 18  'xU$ ALKPHOS 66 68 61  BILITOT 1.8* 1.8* 1.5*    Iron/TIBC/Ferritin/ %Sat No results found for: IRON, TIBC, FERRITIN, IRONPCTSAT    RADIOGRAPHIC STUDIES: I have personally reviewed the radiological images as listed and agreed with the findings in the report. No results found.    ASSESSMENT & PLAN:  1. Malignant neoplasm of upper-inner quadrant of right breast in female, estrogen receptor positive ([place])   2. Aromatase inhibitor use   3. Osteopenia, unspecified  location   Cancer  Staging Malignant neoplasm of upper-inner quadrant of right breast in female, estrogen receptor positive ([place]) Staging form: Breast, AJCC 8th Edition - Clinical stage from 10/26/2019: Stage IA (cT1b, cN0, cM0, G1, ER+, PR+, HER2-) - Signed by Earlie Server, MD on 10/26/2019  #Stage IA right breast cancer Status post surgery and radiation. Currently on Arimidex and tolerates very well. Continue.  We will plan total 5 years of adjuvant endocrine therapy Annual mammogram for surveillance  . #Osteopenia  12/27/2019 DEXA showed osteopenia 10-year major osteoporotic fracture right is 9.2%.   Continue calcium and vitamin D supplementation.  Dental clearance has been obtained. Last Zometa was on 09/19/2020.  Next Zometa due in February 2022.  .   Orders Placed This Encounter  Procedures   CBC with Differential/Platelet    Standing Status:   Future    Standing Expiration Date:   12/06/2021   Comprehensive metabolic panel    Standing Status:   Future    Standing Expiration Date:   12/06/2021    All questions were answered. The patient knows to call the clinic with any problems questions or concerns.  cc [name], MD    Return of visit: 6 months.Earlie Server, MD, PhD [facility] Hematology Oncology 12/06/2020

## 2020-12-19 ENCOUNTER — Other Ambulatory Visit: Payer: Self-pay | Admitting: Oncology

## 2020-12-20 ENCOUNTER — Encounter: Payer: Self-pay | Admitting: Oncology

## 2021-02-13 ENCOUNTER — Ambulatory Visit: Payer: Medicare PPO | Admitting: Dermatology

## 2021-02-13 ENCOUNTER — Encounter: Payer: Self-pay | Admitting: Radiation Oncology

## 2021-02-13 ENCOUNTER — Other Ambulatory Visit: Payer: Self-pay

## 2021-02-13 ENCOUNTER — Ambulatory Visit
Admission: RE | Admit: 2021-02-13 | Discharge: 2021-02-13 | Disposition: A | Discharge status: Home / Self Care | Payer: Medicare PPO | Source: Ambulatory Visit | Attending: Radiation Oncology | Admitting: Radiation Oncology

## 2021-02-13 VITALS — BP 131/75 | HR 82 | Temp 98.7°F | Resp 20 | Wt 208.4 lb

## 2021-02-13 DIAGNOSIS — C50211 Malignant neoplasm of upper-inner quadrant of right female breast: Secondary | ICD-10-CM | POA: Diagnosis not present

## 2021-02-13 DIAGNOSIS — Z170 Estrogen receptor positive status [ER+]: Secondary | ICD-10-CM | POA: Diagnosis not present

## 2021-02-13 DIAGNOSIS — Z79811 Long term (current) use of aromatase inhibitors: Secondary | ICD-10-CM | POA: Insufficient documentation

## 2021-02-13 DIAGNOSIS — Z923 Personal history of irradiation: Secondary | ICD-10-CM | POA: Insufficient documentation

## 2021-02-13 NOTE — Progress Notes (Signed)
Radiation Oncology Follow up Note  Name: Barbara [name]   Date:   02/13/2021 MRN:  [id number] DOB: 1950-12-17    This 71 y.o. female presents to the clinic today for 1 year follow-up status post whole breast radiation to her right breast for stage Ia ER/PR positive invasive mammary carcinoma.  REFERRING PROVIDER: [name], MD  HPI: Patient is a 71 year old female now at 1 year having completed whole breast radiation to her right breast for stage Ia ER/PR positive invasive mammary carcinoma.  Seen today in routine follow-up she is doing well.  She specifically denies breast tenderness cough or bone pain..  She is currently on Arimidex tolerating it well without side effect.  She had mammograms back in September showing no evidence of disease in the right breast there was a subtle area in the left breast of architectural distortion underwent biopsy showing no evidence of malignancy.  COMPLICATIONS OF TREATMENT: none  FOLLOW UP COMPLIANCE: keeps appointments   PHYSICAL EXAM:  BP 131/75    Pulse 82    Temp 98.7 F (37.1 C)    Resp 20    Wt 208 lb 6.4 oz (94.5 kg)    SpO2 100%    BMI 34.68 kg/m  Lungs are clear to A&P cardiac examination essentially unremarkable with regular rate and rhythm. No dominant mass or nodularity is noted in either breast in 2 positions examined. Incision is well-healed. No axillary or supraclavicular adenopathy is appreciated. Cosmetic result is excellent.  Well-developed well-nourished patient in NAD. HEENT reveals PERLA, EOMI, discs not visualized.  Oral cavity is clear. No oral mucosal lesions are identified. Neck is clear without evidence of cervical or supraclavicular adenopathy. Lungs are clear to A&P. Cardiac examination is essentially unremarkable with regular rate and rhythm without murmur rub or thrill. Abdomen is benign with no organomegaly or masses noted. Motor sensory and DTR levels are equal and symmetric in the upper and lower extremities. Cranial  nerves II through XII are grossly intact. Proprioception is intact. No peripheral adenopathy or edema is identified. No motor or sensory levels are noted. Crude visual fields are within normal range.  RADIOLOGY RESULTS: Mammograms and ultrasounds reviewed compatible with above-stated findings  PLAN: Present time patient is now at 1 year with no evidence of disease.  She continues to do well.  She continues on Arimidex without side effect.  I have asked to see her back in 1 year for follow-up.  Patient knows to call with any concerns.  I would like to take this opportunity to thank you for allowing me to participate in the care of your patient.Noreene Filbert, MD

## 2021-02-21 DIAGNOSIS — M65312 Trigger thumb, left thumb: Secondary | ICD-10-CM | POA: Insufficient documentation

## 2021-03-06 ENCOUNTER — Other Ambulatory Visit: Payer: Self-pay | Admitting: General Surgery

## 2021-03-06 DIAGNOSIS — Z853 Personal history of malignant neoplasm of breast: Secondary | ICD-10-CM

## 2021-03-07 ENCOUNTER — Encounter: Payer: Self-pay | Admitting: Oncology

## 2021-03-10 ENCOUNTER — Ambulatory Visit (INDEPENDENT_AMBULATORY_CARE_PROVIDER_SITE_OTHER): Payer: Medicare PPO | Admitting: Internal Medicine

## 2021-03-10 VITALS — BP 124/83 | HR 77 | Resp 16 | Ht 65.0 in | Wt 207.0 lb

## 2021-03-10 DIAGNOSIS — Z7189 Other specified counseling: Secondary | ICD-10-CM | POA: Diagnosis not present

## 2021-03-10 DIAGNOSIS — G4733 Obstructive sleep apnea (adult) (pediatric): Secondary | ICD-10-CM | POA: Diagnosis not present

## 2021-03-10 DIAGNOSIS — Z9989 Dependence on other enabling machines and devices: Secondary | ICD-10-CM

## 2021-03-10 DIAGNOSIS — I10 Essential (primary) hypertension: Secondary | ICD-10-CM | POA: Diagnosis not present

## 2021-03-10 NOTE — Patient Instructions (Signed)

## 2021-03-10 NOTE — Progress Notes (Signed)
[facility] [place], [place] [address]  Pulmonary Sleep Medicine   Office Visit Note  Patient Name: Barbara [name] DOB: 1950/11/02 MRN [id number]    Chief Complaint: Obstructive Sleep Apnea visit  Brief History:  [facility] is seen today for face to face notes fo a replacement unit because current unit has error message "max hours- end of life".  The patient has a 7 year history of sleep apnea. Bedtime 11pm and wake 730am with average 1 - 2 bathroom trips per night. Patient is using PAP nightly.  The patient feels fine after usage after sleeping with PAP.  The patient reports benefiting from PAP use and sleepiness is  resolved and the Epworth Sleepiness Score is 3 out of 24. The patient does take naps on Sunday afternoon and uses her CPAP The patient complains of the following: wanting new CPAP  The compliance download shows  compliance with an average use time of 8:31 hours @ 100%. The AHI is 1.4  The patient does not complain of limb movements disrupting sleep.  ROS  General: (-) fever, (-) chills, (-) night sweat Nose and Sinuses: (-) nasal stuffiness or itchiness, (-) postnasal drip, (-) nosebleeds, (-) sinus trouble. Mouth and Throat: (-) sore throat, (-) hoarseness. Neck: (-) swollen glands, (-) enlarged thyroid, (-) neck pain. Respiratory: - cough, - shortness of breath, - wheezing. Neurologic: - numbness, - tingling. Psychiatric: - anxiety, - depression   Current Medication: Outpatient Encounter Medications as of 03/10/2021  Medication Sig   amLODipine (NORVASC) 5 MG tablet Take 5 mg by mouth at bedtime.    anastrozole (ARIMIDEX) 1 MG tablet Take 1 tablet by mouth once daily   aspirin 81 MG EC tablet Take 81 mg by mouth daily.    azelastine (ASTELIN) 0.1 % nasal spray Place into the nose.   calcipotriene-betamethasone (TACLONEX) external suspension Apply to affected area ears at night until improved.   Calcium Carbonate-Vit D-Min (CALCIUM 1200 PO)  Take by mouth.   cetirizine (ZYRTEC) 10 MG tablet Take 10 mg by mouth every morning.    Cholecalciferol (VITAMIN D) 50 MCG (2000 UT) CAPS Take 2,000 Units by mouth daily.   CINNAMON PO Take 1,000 mg by mouth in the morning and at bedtime.    clobetasol cream (TEMOVATE) 4.09 % Apply 1 application topically 2 (two) times daily.   Krill Oil 500 MG CAPS Take 500 mg by mouth daily.   lovastatin (MEVACOR) 40 MG tablet Take 40 mg by mouth at bedtime.    meloxicam (MOBIC) 15 MG tablet Take 15 mg by mouth daily as needed for pain.   mometasone (ELOCON) 0.1 % cream Apply 1 application topically daily as needed (ear irritation).   pantoprazole (PROTONIX) 40 MG tablet Take 40 mg by mouth every morning.    No facility-administered encounter medications on file as of 03/10/2021.    Surgical History: Past Surgical History:  Procedure Laterality Date   APPENDECTOMY     BREAST BIOPSY Right 10/20/2019   Affirm bx-"X" clip path pending   BREAST BIOPSY Left 11/06/2020   stereo bx, x clip, path pending   BREAST BIOPSY Left 11/06/2020   Stereo bx-Distortion #2-"Coil" clip-path pending   BREAST CYST EXCISION Right    BREAST CYST EXCISION Right    BREAST CYST EXCISION Left    BREAST SURGERY     CHOLECYSTECTOMY     PART MASTECTOMY,RADIO FREQUENCY LOCALIZER,AXILLARY SENTINEL NODE BIOPSY Right 10/30/2019   Procedure: PART MASTECTOMY,RADIO FREQUENCY LOCALIZER,AXILLARY SENTINEL NODE  BIOPSY;  Surgeon: Herbert Pun, MD;  Location: [facility] ORS;  Service: General;  Laterality: Right;    Medical History: Past Medical History:  Diagnosis Date   Cancer ([place])    Family history of breast cancer    Family history of lung cancer    Family history of pancreatic cancer    Family history of uterine cancer    GERD (gastroesophageal reflux disease)    Headache    migraines   Hypertension    Melanoma ([place]) 08/08/2020   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, R lat mid back, pt schedueld for exc 09/11/20   Osteopenia  02/15/2020   Pre-diabetes    Sleep apnea    uses cpap    Family History: Non contributory to the present illness  Social History: Social History   Socioeconomic History   Marital status: Married    Spouse name: Not on file   Number of children: Not on file   Years of education: Not on file   Highest education level: Not on file  Occupational History   Not on file  Tobacco Use   Smoking status: Never   Smokeless tobacco: Never  Vaping Use   Vaping Use: Never used  Substance and Sexual Activity   Alcohol use: No   Drug use: No   Sexual activity: Not on file  Other Topics Concern   Not on file  Social History Narrative   Not on file   Social Determinants of Health   Financial Resource Strain: Not on file  Food Insecurity: Not on file  Transportation Needs: Not on file  Physical Activity: Not on file  Stress: Not on file  Social Connections: Not on file  Intimate Partner Violence: Not on file    Vital Signs: Blood pressure 124/83, pulse 77, resp. rate 16, height 5\' 5"  (1.651 m), weight 207 lb (93.9 kg), SpO2 98 %. Body mass index is 34.45 kg/m.    Examination: General Appearance: The patient is well-developed, well-nourished, and in no distress. Neck Circumference: 38 cm Skin: Gross inspection of skin unremarkable. Head: normocephalic, no gross deformities. Eyes: no gross deformities noted. ENT: ears appear grossly normal Neurologic: Alert and oriented. No involuntary movements.    EPWORTH SLEEPINESS SCALE:  Scale:  (0)= no chance of dozing; (1)= slight chance of dozing; (2)= moderate chance of dozing; (3)= high chance of dozing  Chance  Situtation    Sitting and reading: 0    Watching TV: 1    Sitting Inactive in public: 0    As a passenger in car: 1      Lying down to rest: 0    Sitting and talking: 0    Sitting quielty after lunch: 1    In a car, stopped in traffic: 0   TOTAL SCORE:   3 out of 24    SLEEP STUDIES:  PSG  02/26/14 -  Overall AHI of 23; REM AHI 85; SpO2 low of 74%    CPAP COMPLIANCE DATA:  Date Range: 12/10/20 - 03/09/21  Average Daily Use: 8:31 hours  Median Use: 8:34  Compliance for > 4 Hours: 100%  AHI: 1.4 respiratory events per hour  Days Used: 90/90  Mask Leak: 1.4  95th Percentile Pressure: 10 cmH2O    LABS: No results found for this or any previous visit (from the past 2160 hour(s)).  Radiology: No results found.  No results found.  No results found.    Assessment and Plan: Patient Active Problem List  Diagnosis Date Noted   OSA on CPAP 03/10/2021   CPAP use counseling 03/10/2021   Osteopenia 02/15/2020   Elevated fasting blood sugar 11/28/2019   GERD (gastroesophageal reflux disease) 11/28/2019   Hyperlipidemia 11/28/2019   Hypertension, essential, benign 11/28/2019   Genetic testing 11/21/2019   Family history of breast cancer    Family history of pancreatic cancer    Family history of uterine cancer    Family history of lung cancer    Malignant neoplasm of upper-inner quadrant of right breast in female, estrogen receptor positive ([place]) 10/26/2019   Obesity (BMI 30.0-34.9) 08/30/2017   Sleep apnea 02/21/2014   Arthritis, 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 OSA on CPAP The patient does tolerate PAP and reports  benefit from PAP use. The patient was reminded how to clean equipment and advised to replace supplies routinely. The patient was also counselled on weight loss. The compliance is excellent. The AHI is 1.4.   OSA- replace machine. F/u 30d after set up.    2. CPAP use counseling CPAP Counseling: had a lengthy discussion with the patient regarding the importance of PAP therapy in management of the sleep apnea. Patient appears to understand the risk factor reduction and also understands the risks associated with untreated sleep apnea. Patient will try to make a good faith effort to remain compliant with therapy. Also instructed the patient on proper  cleaning of the device including the water must be changed daily if possible and use of distilled water is preferred. Patient understands that the machine should be regularly cleaned with appropriate recommended cleaning solutions that do not damage the PAP machine for example given white vinegar and water rinses. Other methods such as ozone treatment may not be as good as these simple methods to achieve cleaning.   3. Hypertension, essential, benign Hypertension Counseling:   The following hypertensive lifestyle modification were recommended and discussed:  1. Limiting alcohol intake to less than 1 oz/day of ethanol:(24 oz of beer or 8 oz of wine or 2 oz of 100-proof whiskey). 2. Take baby ASA 81 mg daily. 3. Importance of regular aerobic exercise and losing weight. 4. Reduce dietary saturated fat and cholesterol intake for overall cardiovascular health. 5. Maintaining adequate dietary potassium, calcium, and magnesium intake. 6. Regular monitoring of the blood pressure. 7. Reduce sodium intake to less than 100 mmol/day (less than 2.3 gm of sodium or less than 6 gm of sodium choride)     General Counseling: I have discussed the findings of the evaluation and examination with Blanch Media.  I have also discussed any further diagnostic evaluation thatmay be needed or ordered today. Zeenat verbalizes understanding of the findings of todays visit. We also reviewed her medications today and discussed drug interactions and side effects including but not limited excessive drowsiness and altered mental states. We also discussed that there is always a risk not just to her but also people around her. she has been encouraged to call the office with any questions or concerns that should arise related to todays visit.  No orders of the defined types were placed in this encounter.       I have personally obtained a history, examined the patient, evaluated laboratory and imaging results, formulated the assessment and  plan and placed orders. This patient was seen today by Tressie Ellis, PA-C in collaboration with Dr. Devona Konig.   Allyne Gee, MD [facility] Diplomate ABMS Pulmonary Critical Care Medicine and Sleep Medicine

## 2021-03-11 ENCOUNTER — Other Ambulatory Visit: Payer: Self-pay | Admitting: Oncology

## 2021-03-21 ENCOUNTER — Encounter: Payer: Self-pay | Admitting: Oncology

## 2021-03-21 ENCOUNTER — Inpatient Hospital Stay: Payer: Medicare PPO

## 2021-03-21 ENCOUNTER — Inpatient Hospital Stay: Payer: Medicare PPO | Attending: Oncology

## 2021-03-21 ENCOUNTER — Other Ambulatory Visit: Payer: Self-pay

## 2021-03-21 ENCOUNTER — Inpatient Hospital Stay (HOSPITAL_BASED_OUTPATIENT_CLINIC_OR_DEPARTMENT_OTHER): Payer: Medicare PPO | Admitting: Oncology

## 2021-03-21 VITALS — BP 120/55 | HR 64 | Temp 97.0°F | Resp 18 | Wt 208.0 lb

## 2021-03-21 DIAGNOSIS — M8580 Other specified disorders of bone density and structure, unspecified site: Secondary | ICD-10-CM

## 2021-03-21 DIAGNOSIS — C50211 Malignant neoplasm of upper-inner quadrant of right female breast: Secondary | ICD-10-CM | POA: Insufficient documentation

## 2021-03-21 DIAGNOSIS — Z170 Estrogen receptor positive status [ER+]: Secondary | ICD-10-CM | POA: Insufficient documentation

## 2021-03-21 DIAGNOSIS — Z79811 Long term (current) use of aromatase inhibitors: Secondary | ICD-10-CM | POA: Insufficient documentation

## 2021-03-21 DIAGNOSIS — G4730 Sleep apnea, unspecified: Secondary | ICD-10-CM | POA: Insufficient documentation

## 2021-03-21 DIAGNOSIS — Z79899 Other long term (current) drug therapy: Secondary | ICD-10-CM | POA: Diagnosis not present

## 2021-03-21 DIAGNOSIS — Z8049 Family history of malignant neoplasm of other genital organs: Secondary | ICD-10-CM | POA: Insufficient documentation

## 2021-03-21 DIAGNOSIS — Z801 Family history of malignant neoplasm of trachea, bronchus and lung: Secondary | ICD-10-CM | POA: Diagnosis not present

## 2021-03-21 DIAGNOSIS — Z7951 Long term (current) use of inhaled steroids: Secondary | ICD-10-CM | POA: Insufficient documentation

## 2021-03-21 DIAGNOSIS — N6022 Fibroadenosis of left breast: Secondary | ICD-10-CM | POA: Diagnosis not present

## 2021-03-21 DIAGNOSIS — I10 Essential (primary) hypertension: Secondary | ICD-10-CM | POA: Diagnosis not present

## 2021-03-21 DIAGNOSIS — Z803 Family history of malignant neoplasm of breast: Secondary | ICD-10-CM | POA: Diagnosis not present

## 2021-03-21 DIAGNOSIS — Z800 Family history of malignant neoplasm of digestive organs: Secondary | ICD-10-CM | POA: Diagnosis not present

## 2021-03-21 DIAGNOSIS — Z85820 Personal history of malignant melanoma of skin: Secondary | ICD-10-CM | POA: Insufficient documentation

## 2021-03-21 DIAGNOSIS — Z9049 Acquired absence of other specified parts of digestive tract: Secondary | ICD-10-CM | POA: Insufficient documentation

## 2021-03-21 DIAGNOSIS — Z853 Personal history of malignant neoplasm of breast: Secondary | ICD-10-CM

## 2021-03-21 DIAGNOSIS — N6042 Mammary duct ectasia of left breast: Secondary | ICD-10-CM | POA: Insufficient documentation

## 2021-03-21 LAB — COMPREHENSIVE METABOLIC PANEL
ALT: 18 U/L (ref 0–44)
AST: 15 U/L (ref 15–41)
Albumin: 3.8 g/dL (ref 3.5–5.0)
Alkaline Phosphatase: 63 U/L (ref 38–126)
Anion gap: 8 (ref 5–15)
BUN: 18 mg/dL (ref 8–23)
CO2: 26 mmol/L (ref 22–32)
Calcium: 9.5 mg/dL (ref 8.9–10.3)
Chloride: 103 mmol/L (ref 98–111)
Creatinine, Ser: 0.89 mg/dL (ref 0.44–1.00)
GFR, Estimated: >60 mL/min (ref >60)
Glucose, Bld: 131 mg/dL — ABNORMAL HIGH (ref 70–99)
Potassium: 3.9 mmol/L (ref 3.5–5.1)
Sodium: 137 mmol/L (ref 135–145)
Total Bilirubin: 1.4 mg/dL — ABNORMAL HIGH (ref 0.3–1.2)
Total Protein: 6.5 g/dL (ref 6.5–8.1)

## 2021-03-21 LAB — CBC WITH DIFFERENTIAL/PLATELET
Abs Immature Granulocytes: 0.02 10*3/uL (ref 0.00–0.07)
Basophils Absolute: 0 10*3/uL (ref 0.0–0.1)
Basophils Relative: 1 %
Eosinophils Absolute: 0.2 10*3/uL (ref 0.0–0.5)
Eosinophils Relative: 3 %
HCT: 41.6 % (ref 36.0–46.0)
Hemoglobin: 13.9 g/dL (ref 12.0–15.0)
Immature Granulocytes: 0 %
Lymphocytes Relative: 22 %
Lymphs Abs: 1.2 10*3/uL (ref 0.7–4.0)
MCH: 27.5 pg (ref 26.0–34.0)
MCHC: 33.4 g/dL (ref 30.0–36.0)
MCV: 82.2 fL (ref 80.0–100.0)
Monocytes Absolute: 0.4 10*3/uL (ref 0.1–1.0)
Monocytes Relative: 7 %
Neutro Abs: 3.7 10*3/uL (ref 1.7–7.7)
Neutrophils Relative %: 67 %
Platelets: 166 10*3/uL (ref 150–400)
RBC: 5.06 MIL/uL (ref 3.87–5.11)
RDW: 14.5 % (ref 11.5–15.5)
WBC: 5.6 10*3/uL (ref 4.0–10.5)
nRBC: 0 % (ref 0.0–0.2)

## 2021-03-21 MED ORDER — ZOLEDRONIC ACID 4 MG/100ML IV SOLN
4.0000 mg | Freq: Once | INTRAVENOUS | Status: AC
  Administered 2021-03-21: 4 mg via INTRAVENOUS
  Filled 2021-03-21: qty 100

## 2021-03-21 MED ORDER — SODIUM CHLORIDE 0.9 % IV SOLN
Freq: Once | INTRAVENOUS | Status: AC
  Filled 2021-03-21: qty 250

## 2021-03-21 NOTE — Progress Notes (Signed)
Hematology/Oncology Progress note [phone] [phone]      Patient Care Team: Marina Goodell, MD as PCP - General (Family Medicine) Rickard Patience, MD as Consulting Physician (Oncology) Carmina Miller, MD as Referring Physician (Radiation Oncology) Carolan Shiver, MD as Consulting Physician (General Surgery)  REFERRING PROVIDER: Marina Goodell, MD  CHIEF COMPLAINTS/REASON FOR VISIT:  Follow up for breast cancer  HISTORY OF PRESENTING ILLNESS:   Barbara [name] is a  71 y.o.  female with PMH listed below was seen in consultation at the request of  Marina Goodell, MD  for evaluation of breast cancer  10/13/2019 diagnostic mammogram  Showed 7mm spiculated mass in the right breast 1:00. Unchanged left axillary probabaly benign mass- 1 year stability.  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 ER 90% positive, PR 1-0% positive, HER2 negative.  Patient denies any breast skin changes or nipple discharge  Family history of breast cancer: Emelda Brothers, and Dennie Bible first cousin Family history of other cancers: lung cancer in mother, pancreatic cancer in father, unknown cancer in pat uncle.   Menarche: 89 or 56 Menopause: age of 44 Number of pregnancies : 2 Age at first live childbirth:2 Used OCP: remote use of OCP for couple of years Used estrogen and progesterone therapy: denies History of Radiation to the chest: denies Previous of breast biopsy: previous biopsies in 1970s  # 10/30/2019, patient underwent right lumpectomy and sentinel lymph node biopsy.  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 4 mm pT1a pN0 tubular carcinoma, grade 1. #01/18/2020, finished adjuvant radiation. #January  2022, started on Arimidex  # Family history of pancreatic cancer and breast cancer.  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  10/30/2020, bilateral diagnostic mammogram showed a subtle left breast architectural distortion without suspicious sonographic correlate. 11/06/2020, left breast upper inner stereotactic biopsy showed benign breast tissue with fibrocystic changes with ductal ectasia.  Xarelto endolitis stromal hyperplasia, usual ductal hyperplasia.  Negative for atypia and malignancy.  Left breast upper inner posterior stereotactic biopsy showed benign breast tissue with sclerosing adenosis.  Fibrocystic changes.  Negative for atypia and malignancy.   INTERVAL HISTORY Barbara [name] is a 71 y.o. female who has above history reviewed by me today presents for follow up visit for management of right breast cancer. Patient reports feeling well.  She has no new complaints.     Review of Systems  Constitutional:  Negative for appetite change, chills, fatigue and fever.  HENT:   Negative for hearing loss and voice change.   Eyes:  Negative for eye problems.  Respiratory:  Negative for chest tightness and cough.   Cardiovascular:  Negative for chest pain.  Gastrointestinal:  Negative for abdominal distention, abdominal pain and blood in stool.  Endocrine: Negative for hot flashes.  Genitourinary:  Negative for difficulty urinating and frequency.   Musculoskeletal:  Negative for arthralgias.  Skin:  Negative for itching and rash.  Neurological:  Negative for extremity weakness.  Hematological:  Negative for adenopathy.  Psychiatric/Behavioral:  Negative for confusion.    MEDICAL HISTORY:  Past Medical History:  Diagnosis Date   Cancer [facility])    Family history of breast cancer    Family history of lung cancer  Family history of pancreatic cancer    Family history of uterine cancer    GERD (gastroesophageal  reflux disease)    Headache    migraines   Hypertension    Melanoma ([place]) 08/08/2020   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, R lat mid back, pt schedueld for exc 09/11/20   Osteopenia 02/15/2020   Pre-diabetes    Sleep apnea    uses cpap    SURGICAL HISTORY: Past Surgical History:  Procedure Laterality Date   APPENDECTOMY     BREAST BIOPSY Right 10/20/2019   Affirm bx-"X" clip path pending   BREAST BIOPSY Left 11/06/2020   stereo bx, x clip, path pending   BREAST BIOPSY Left 11/06/2020   Stereo bx-Distortion #2-"Coil" clip-path pending   BREAST CYST EXCISION Right    BREAST CYST EXCISION Right    BREAST CYST EXCISION Left    BREAST SURGERY     CHOLECYSTECTOMY     PART MASTECTOMY,RADIO FREQUENCY LOCALIZER,AXILLARY SENTINEL NODE BIOPSY Right 10/30/2019   Procedure: PART MASTECTOMY,RADIO FREQUENCY LOCALIZER,AXILLARY SENTINEL NODE BIOPSY;  Surgeon: Herbert Pun, MD;  Location: [facility] ORS;  Service: General;  Laterality: Right;    SOCIAL HISTORY: Social History   Socioeconomic History   Marital status: Married    Spouse name: Not on file   Number of children: Not on file   Years of education: Not on file   Highest education level: Not on file  Occupational History   Not on file  Tobacco Use   Smoking status: Never   Smokeless tobacco: Never  Vaping Use   Vaping Use: Never used  Substance and Sexual Activity   Alcohol use: No   Drug use: No   Sexual activity: Not on file  Other Topics Concern   Not on file  Social History Narrative   Not on file   Social Determinants of Health   Financial Resource Strain: Not on file  Food Insecurity: Not on file  Transportation Needs: Not on file  Physical Activity: Not on file  Stress: Not on file  Social Connections: Not on file  Intimate Partner Violence: Not on file    FAMILY HISTORY: Family History  Problem Relation Age of Onset   Breast cancer Paternal Aunt    Lung cancer Mother    Pancreatic cancer Father     Cancer Paternal Uncle        unk type   Cancer Paternal Aunt        unk type, possibly breast   Breast cancer Cousin    Cancer Cousin        unk type    ALLERGIES:  has No Known Allergies.  MEDICATIONS:  Current Outpatient Medications  Medication Sig Dispense Refill   amLODipine (NORVASC) 5 MG tablet Take 5 mg by mouth at bedtime.      anastrozole (ARIMIDEX) 1 MG tablet Take 1 tablet by mouth once daily 90 tablet 0   aspirin 81 MG EC tablet Take 81 mg by mouth daily.      calcipotriene-betamethasone (TACLONEX) external suspension Apply to affected area ears at night until improved. 60 g 1   Calcium Carbonate-Vit D-Min (CALCIUM 1200 PO) Take by mouth.     cetirizine (ZYRTEC) 10 MG tablet Take 10 mg by mouth every morning.      Cholecalciferol (VITAMIN D) 50 MCG (2000 UT) CAPS Take 2,000 Units by mouth daily.     CINNAMON PO Take 1,000 mg by mouth in the morning and at bedtime.  clobetasol cream (TEMOVATE) 3.78 % Apply 1 application topically 2 (two) times daily. 45 g 1   Krill Oil 500 MG CAPS Take 500 mg by mouth daily.     lovastatin (MEVACOR) 40 MG tablet Take 40 mg by mouth at bedtime.      meloxicam (MOBIC) 15 MG tablet Take 15 mg by mouth daily as needed for pain.     mometasone (ELOCON) 0.1 % cream Apply 1 application topically daily as needed (ear irritation).     pantoprazole (PROTONIX) 40 MG tablet Take 40 mg by mouth every morning.      azelastine (ASTELIN) 0.1 % nasal spray Place into the nose. (Patient not taking: Reported on 03/21/2021)     No current facility-administered medications for this visit.     PHYSICAL EXAMINATION: ECOG PERFORMANCE STATUS: 0 - Asymptomatic Vitals:   03/21/21 0943  BP: (!) 120/55  Pulse: 64  Resp: 18  Temp: (!) 97 F (36.1 C)  SpO2: 97%   Filed Weights   03/21/21 0943  Weight: 208 lb (94.3 kg)    Physical Exam Constitutional:      General: She is not in acute distress. HENT:     Head: Normocephalic and atraumatic.   Eyes:     General: No scleral icterus. Cardiovascular:     Rate and Rhythm: Normal rate and regular rhythm.     Heart sounds: Normal heart sounds.  Pulmonary:     Effort: Pulmonary effort is normal. No respiratory distress.     Breath sounds: No wheezing.  Abdominal:     General: Bowel sounds are normal. There is no distension.     Palpations: Abdomen is soft.  Musculoskeletal:        General: No deformity. Normal range of motion.     Cervical back: Normal range of motion and neck supple.  Skin:    General: Skin is warm and dry.     Findings: No erythema or rash.  Neurological:     Mental Status: She is alert and oriented to person, place, and time. Mental status is at baseline.     Cranial Nerves: No cranial nerve deficit.     Coordination: Coordination normal.  Psychiatric:        Mood and Affect: Mood normal.   Right lumpectomy site with scarring tissue. No palpable breast mass in both breasts.  No palatable axillary lymphadenopathy bilaterally.    LABORATORY DATA:  I have reviewed the data as listed Lab Results  Component Value Date   WBC 5.6 03/21/2021   HGB 13.9 03/21/2021   HCT 41.6 03/21/2021   MCV 82.2 03/21/2021   PLT 166 03/21/2021   Recent Labs    09/19/20 1328 12/06/20 1149 03/21/21 0919  NA 139 137 137  K 3.5 3.8 3.9  CL 104 104 103  CO2 $Re'27 25 26  'fVD$ GLUCOSE 138* 113* 131*  BUN $Re'15 18 18  'ljL$ CREATININE 0.96 0.97 0.89  CALCIUM 9.7 9.4 9.5  GFRNONAA >60 >60 >60  PROT 6.9 7.0 6.5  ALBUMIN 4.0 4.0 3.8  AST $Re'17 15 15  'YiN$ ALT $R'19 18 18  'gM$ ALKPHOS 68 61 63  BILITOT 1.8* 1.5* 1.4*    Iron/TIBC/Ferritin/ %Sat No results found for: IRON, TIBC, FERRITIN, IRONPCTSAT    RADIOGRAPHIC STUDIES: I have personally reviewed the radiological images as listed and agreed with the findings in the report. No results found.    ASSESSMENT & PLAN:  1. Malignant neoplasm of upper-inner quadrant of right breast in female, estrogen  receptor positive ([place])   2. Aromatase  inhibitor use   3. Osteopenia, unspecified location    Cancer Staging  Malignant neoplasm of upper-inner quadrant of right breast in female, estrogen receptor positive ([place]) Staging form: Breast, AJCC 8th Edition - Clinical stage from 10/26/2019: Stage IA (cT1b, cN0, cM0, G1, ER+, PR+, HER2-) - Signed by Earlie Server, MD on 10/26/2019  #Stage IA right breast cancer, s/p lumpectomy/SLNB and radiation. Labs reviewed and discussed with patient Continue Arimidex 1 mg daily.  Refills were sent to pharmacy. plan total 5 years of adjuvant endocrine therapy Left breast distortion biopsy showed no malignancy.  She is scheduled to have a repeat diagnostic mammogram of left side in April 2023.  Marland Kitchen #Osteopenia  12/27/2019 DEXA showed osteopenia 10-year major osteoporotic fracture right is 9.2%.   Continue calcium and vitamin D supplementation Proceed with Zometa today.  Orders Placed This Encounter  Procedures   CBC with Differential/Platelet    Standing Status:   Future    Standing Expiration Date:   03/21/2022   Comprehensive metabolic panel    Standing Status:   Future    Standing Expiration Date:   03/21/2022    All questions were answered. The patient knows to call the clinic with any problems questions or concerns.  cc [name], MD    Return of visit: 6 months.Earlie Server, MD, PhD [facility] Hematology Oncology 03/21/2021

## 2021-03-21 NOTE — Progress Notes (Signed)
Pt here for follow up. No new concerns voiced.   

## 2021-04-07 ENCOUNTER — Encounter: Payer: Self-pay | Admitting: Dermatology

## 2021-04-07 ENCOUNTER — Ambulatory Visit: Payer: Medicare PPO | Admitting: Dermatology

## 2021-04-07 ENCOUNTER — Other Ambulatory Visit: Payer: Self-pay

## 2021-04-07 DIAGNOSIS — L219 Seborrheic dermatitis, unspecified: Secondary | ICD-10-CM

## 2021-04-07 DIAGNOSIS — D225 Melanocytic nevi of trunk: Secondary | ICD-10-CM

## 2021-04-07 DIAGNOSIS — L578 Other skin changes due to chronic exposure to nonionizing radiation: Secondary | ICD-10-CM

## 2021-04-07 DIAGNOSIS — D2261 Melanocytic nevi of right upper limb, including shoulder: Secondary | ICD-10-CM | POA: Diagnosis not present

## 2021-04-07 DIAGNOSIS — L814 Other melanin hyperpigmentation: Secondary | ICD-10-CM

## 2021-04-07 DIAGNOSIS — L570 Actinic keratosis: Secondary | ICD-10-CM | POA: Diagnosis not present

## 2021-04-07 DIAGNOSIS — L821 Other seborrheic keratosis: Secondary | ICD-10-CM

## 2021-04-07 DIAGNOSIS — Z86006 Personal history of melanoma in-situ: Secondary | ICD-10-CM

## 2021-04-07 DIAGNOSIS — D2271 Melanocytic nevi of right lower limb, including hip: Secondary | ICD-10-CM | POA: Diagnosis not present

## 2021-04-07 DIAGNOSIS — D229 Melanocytic nevi, unspecified: Secondary | ICD-10-CM

## 2021-04-07 DIAGNOSIS — B351 Tinea unguium: Secondary | ICD-10-CM

## 2021-04-07 DIAGNOSIS — L853 Xerosis cutis: Secondary | ICD-10-CM

## 2021-04-07 DIAGNOSIS — D1800 Hemangioma unspecified site: Secondary | ICD-10-CM

## 2021-04-07 DIAGNOSIS — Z1283 Encounter for screening for malignant neoplasm of skin: Secondary | ICD-10-CM

## 2021-04-07 DIAGNOSIS — I8393 Asymptomatic varicose veins of bilateral lower extremities: Secondary | ICD-10-CM

## 2021-04-07 DIAGNOSIS — R202 Paresthesia of skin: Secondary | ICD-10-CM

## 2021-04-07 MED ORDER — TAVABOROLE 5 % EX SOLN: CUTANEOUS | 3 refills | Status: DC

## 2021-04-07 NOTE — Patient Instructions (Addendum)
Cryotherapy Aftercare  Wash gently with soap and water everyday.   Apply Vaseline and Band-Aid daily until healed.   Notalgia paresthetica is a chronic condition affecting the skin of the back in which a pinched nerve along the spine causes itching or changes in sensation in an area of skin. This is usually accompanied by chronic rubbing or scratching. There is no cure, but there are some treatments which may help control the itch.   Over the counter (non-prescription) treatments for notalgia paresthetica include numbing creams like pramoxine or lidocaine which temporarily reduce itch or Capsaicin-containing creams which cause a burning sensation but which sometimes over time will reset the nerves to stop producing itch. Continue clobetasol cream Apply 1-2 times a day as needed for itch. Avoid face, groin, underarms. Topical steroids (such as triamcinolone, fluocinolone, fluocinonide, mometasone, clobetasol, halobetasol, betamethasone, hydrocortisone) can cause thinning and lightening of the skin if they are used for too long in the same area. Your physician has selected the right strength medicine for your problem and area affected on the body. Please use your medication only as directed by your physician to prevent side effects.    If you choose to use Capsaicin cream, it is recommended to use it 5 times daily for 1 week followed by 3 times daily for 3-6 weeks. You may have to continue using it long-term. For severe cases, there are some prescription cream or pill options which may help.   Melanoma ABCDEs  Melanoma is the most dangerous type of skin cancer, and is the leading cause of death from skin disease.  You are more likely to develop melanoma if you: Have light-colored skin, light-colored eyes, or red or blond hair Spend a lot of time in the sun Tan regularly, either outdoors or in a tanning bed Have had blistering sunburns, especially during childhood Have a close family member who has  had a melanoma Have atypical moles or large birthmarks  Early detection of melanoma is key since treatment is typically straightforward and cure rates are extremely high if we catch it early.   The first sign of melanoma is often a change in a mole or a new dark spot.  The ABCDE system is a way of remembering the signs of melanoma.  A for asymmetry:  The two halves do not match. B for border:  The edges of the growth are irregular. C for color:  A mixture of colors are present instead of an even brown color. D for diameter:  Melanomas are usually (but not always) greater than 48m - the size of a pencil eraser. E for evolution:  The spot keeps changing in size, shape, and color.  Please check your skin once per month between visits. You can use a small mirror in front and a large mirror behind you to keep an eye on the back side or your body.   If you see any new or changing lesions before your next follow-up, please call to schedule a visit.  Please continue daily skin protection including broad spectrum sunscreen SPF 30+ to sun-exposed areas, reapplying every 2 hours as needed when you're outdoors.   Staying in the shade or wearing long sleeves, sun glasses (UVA+UVB protection) and wide brim hats (4-inch brim around the entire circumference of the hat) are also recommended for sun protection.     If You Need Anything After Your Visit  If you have any questions or concerns for your doctor, please call our main line at [phone]  and press option 4 to reach your doctor's medical assistant. If no one answers, please leave a voicemail as directed and we will return your call as soon as possible. Messages left after 4 pm will be answered the following business day.   You may also send [place] a message via [place]. We typically respond to MyChart messages within 1-2 business days.  For prescription refills, please ask your pharmacy to contact our office. Our fax number is [phone].  If you  have an urgent issue when the clinic is closed that cannot wait until the next business day, you can page your doctor at the number below.    Please note that while we do our best to be available for urgent issues outside of office hours, we are not available 24/7.   If you have an urgent issue and are unable to reach [place], you may choose to seek medical care at your doctor's office, retail clinic, urgent care center, or emergency room.  If you have a medical emergency, please immediately call 911 or go to the emergency department.  Pager Numbers  - Dr. Nehemiah Massed: [phone]  - Dr. Laurence Ferrari: [phone]  - Dr. Nicole Kindred: [phone]  In the event of inclement weather, please call our main line at [phone] for an update on the status of any delays or closures.  Dermatology Medication Tips: Please keep the boxes that topical medications come in in order to help keep track of the instructions about where and how to use these. Pharmacies typically print the medication instructions only on the boxes and not directly on the medication tubes.   If your medication is too expensive, please contact our office at [phone] option 4 or send [place] a message through [place].   We are unable to tell what your co-pay for medications will be in advance as this is different depending on your insurance coverage. However, we may be able to find a substitute medication at lower cost or fill out paperwork to get insurance to cover a needed medication.   If a prior authorization is required to get your medication covered by your insurance company, please allow [place] 1-2 business days to complete this process.  Drug prices often vary depending on where the prescription is filled and some pharmacies may offer cheaper prices.  The website www.goodrx.com contains coupons for medications through different pharmacies. The prices here do not account for what the cost may be with help from insurance (it may be cheaper  with your insurance), but the website can give you the price if you did not use any insurance.  - You can print the associated coupon and take it with your prescription to the pharmacy.  - You may also stop by our office during regular business hours and pick up a GoodRx coupon card.  - If you need your prescription sent electronically to a different pharmacy, notify our office through [facility] or by phone at [phone] option 4.     Si Usted Necesita Algo Despus de Su Visita  Tambin puede enviarnos un mensaje a travs de Pharmacist, community. Por lo general respondemos a los mensajes de MyChart en el transcurso de 1 a 2 das hbiles.  Para renovar recetas, por favor pida a su farmacia que se ponga en contacto con nuestra oficina. Harland Dingwall de fax es [place] [phone].  Si tiene un asunto urgente cuando la clnica est cerrada y que no puede esperar hasta el siguiente da hbil, puede llamar/localizar a su doctor(a) al  nmero que aparece a continuacin.   Por favor, tenga en cuenta que aunque hacemos todo lo posible para estar disponibles para asuntos urgentes fuera del horario de [place], no estamos disponibles las 24 horas del da, los 7 das de la [place].   Si tiene un problema urgente y no puede comunicarse con nosotros, puede optar por buscar atencin mdica  en el consultorio de su doctor(a), en una clnica privada, en un centro de atencin urgente o en una sala de emergencias.  Si tiene Engineering geologist, por favor llame inmediatamente al 911 o vaya a la sala de emergencias.  Nmeros de bper  - Dr. Nehemiah Massed: [phone]  - Dra. Moye: [phone]  - Dra. Nicole Kindred: [phone]  En caso de inclemencias del [place], por favor llame a Johnsie Kindred principal al [phone] para una actualizacin sobre el [place] de cualquier retraso o cierre.  Consejos para la medicacin en dermatologa: Por favor, guarde las cajas en las que vienen los medicamentos de uso tpico para  ayudarle a seguir las instrucciones sobre dnde y cmo usarlos. Las farmacias generalmente imprimen las instrucciones del medicamento slo en las cajas y no directamente en los tubos del [place].   Si su medicamento es muy caro, por favor, pngase en contacto con Zigmund Daniel llamando al [phone] y presione la opcin 4 o envenos un mensaje a travs de Pharmacist, community.   No podemos decirle cul ser su copago por los medicamentos por adelantado ya que esto es diferente dependiendo de la cobertura de su seguro. Sin embargo, es posible que podamos encontrar un medicamento sustituto a Electrical engineer un formulario para que el seguro cubra el medicamento que se considera necesario.   Si se requiere una autorizacin previa para que su compaa de seguros [place] su medicamento, por favor permtanos de 1 a 2 das hbiles para completar este proceso.  Los precios de los medicamentos varan con frecuencia dependiendo del Environmental consultant de dnde se surte la receta y alguna farmacias pueden ofrecer precios ms baratos.  El sitio web www.goodrx.com tiene cupones para medicamentos de Airline pilot. Los precios aqu no tienen en cuenta lo que podra costar con la ayuda del seguro (puede ser ms barato con su seguro), pero el sitio web puede darle el precio si no utiliz Research scientist (physical sciences).  - Puede imprimir el cupn correspondiente y llevarlo con su receta a la farmacia.  - Tambin puede pasar por nuestra oficina durante el horario de atencin regular y Charity fundraiser una tarjeta de cupones de GoodRx.  - Si necesita que su receta se enve electrnicamente a una farmacia diferente, informe a nuestra oficina a travs de MyChart de [place] o por telfono llamando al [phone] y presione la opcin 4.

## 2021-04-07 NOTE — Progress Notes (Signed)
Follow-Up Visit   Subjective  Barbara [name] is a 71 y.o. female who presents for the following: Follow-up (The patient presents for Total-Body Skin Exam (TBSE) for skin cancer screening and mole check.  The patient has spots, moles and lesions to be evaluated, some may be new or changing. 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   The following portions of the chart were reviewed this encounter and updated as appropriate:       Review of Systems:  No other skin or systemic complaints except as noted in HPI or Assessment and Plan.  Objective  Well appearing patient in no apparent distress; mood and affect are within normal limits.  A full examination was performed including scalp, head, eyes, ears, nose, lips, neck, chest, axillae, abdomen, back, buttocks, bilateral upper extremities, bilateral lower extremities, hands, feet, fingers, toes, fingernails, and toenails. All findings within normal limits unless otherwise noted below.  Right lateral mid back Well healed scar with no evidence of recurrence.   R mid back 2.5 mm brown macule  R hand dorsum 2.0 mm med dark brown flat papule - vs SK  R med pretibia 3.0 mm brown macule  toenails Yellow white discoloration with thickening of the bilateral great toes       R malar cheek x 3, L nasal tip x 1 (4) Pink/brown scaly macules.  bil ear canals Mild erythema and scale of the ear canals  L spinal mid back Hyperpigmented patch.    Assessment & Plan  Skin cancer screening performed today.  Actinic Damage - chronic, secondary to cumulative UV radiation exposure/sun exposure over time - diffuse scaly erythematous macules with underlying dyspigmentation - Recommend daily broad spectrum sunscreen SPF 30+ to sun-exposed areas, reapply every 2 hours as needed.  - Recommend staying in the shade or wearing long sleeves, sun glasses (UVA+UVB protection) and wide  brim hats (4-inch brim around the entire circumference of the hat). - Call for new or changing lesions.  Lentigines - Scattered tan macules - Due to sun exposure - Benign-appering, observe - Recommend daily broad spectrum sunscreen SPF 30+ to sun-exposed areas, reapply every 2 hours as needed. - Call for any changes  Melanocytic Nevi - Tan-brown and/or pink-flesh-colored symmetric macules and papules - Benign appearing on exam today - Observation - Call clinic for new or changing moles - Recommend daily use of broad spectrum spf 30+ sunscreen to sun-exposed areas.   Varicose Veins/Spider Veins - Dilated blue, purple or red veins at the lower extremities - Reassured - Smaller vessels can be treated by sclerotherapy (a procedure to inject a medicine into the veins to make them disappear) if desired, but the treatment is not covered by insurance. Larger vessels may be covered if symptomatic and we would refer to vascular surgeon if treatment desired.  Seborrheic Keratoses - Stuck-on, waxy, tan-brown papules and/or plaques  - Benign-appearing - Discussed benign etiology and prognosis. - Observe - Call for any changes  Hemangiomas - Red papules - Discussed benign nature - Observe - Call for any changes  Xerosis - diffuse xerotic patches - recommend gentle, hydrating skin care - gentle skin care handout given  History of melanoma in situ Right lateral mid back  Clear. Observe for recurrence. Call clinic for new or changing lesions.  Recommend regular skin exams, daily broad-spectrum spf 30+ sunscreen use, and photoprotection.    Nevus (3) R hand dorsum; R med  pretibia; R mid back  vs SK - R hand dorsum  Benign-appearing.  Observation.  Call clinic for new or changing moles.  Recommend daily use of broad spectrum spf 30+ sunscreen to sun-exposed areas.   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  Discussed can take year for toenail to grow out.  If not improving, may consider oral antifungal treatment.  Tavaborole 5 % SOLN - toenails Apply to and under great toenails every night until improved  AK (actinic keratosis) (4) R malar cheek x 3, L nasal tip x 1  Actinic keratoses are precancerous spots that appear secondary to cumulative UV radiation exposure/sun exposure over time. They are chronic with expected duration over 1 year. A portion of actinic keratoses will progress to squamous cell carcinoma of the skin. It is not possible to reliably predict which spots will progress to skin cancer and so treatment is recommended to prevent development of skin cancer.  Recommend daily broad spectrum sunscreen SPF 30+ to sun-exposed areas, reapply every 2 hours as needed.  Recommend staying in the shade or wearing long sleeves, sun glasses (UVA+UVB protection) and wide brim hats (4-inch brim around the entire circumference of the hat). Call for new or changing lesions.  Destruction of lesion - R malar cheek x 3, L nasal tip x 1  Destruction method: cryotherapy   Informed consent: discussed and consent obtained   Lesion destroyed using liquid nitrogen: Yes   Region frozen until ice ball extended beyond lesion: Yes   Outcome: patient tolerated procedure well with no complications   Post-procedure details: wound care instructions given   Additional details:  Prior to procedure, discussed risks of blister formation, small wound, skin dyspigmentation, or rare scar following cryotherapy. Recommend Vaseline ointment to treated areas while healing.   Seborrheic dermatitis bil ear canals  Chronic condition with duration or expected duration over one year. Improved with topical treatment.  Seborrheic Dermatitis  -  is a chronic persistent rash characterized by pinkness and scaling most commonly of the mid face but also can occur on the scalp  (dandruff), ears; mid chest, mid back and groin.  It tends to be exacerbated by stress and cooler weather.  People who have neurologic disease may experience new onset or exacerbation of existing seborrheic dermatitis.  The condition is not curable but treatable and can be controlled.  Continue mometasone cream qd/bid prn flares. Pt has.  Topical steroids (such as triamcinolone, fluocinolone, fluocinonide, mometasone, clobetasol, halobetasol, betamethasone, hydrocortisone) can cause thinning and lightening of the skin if they are used for too long in the same area. Your physician has selected the right strength medicine for your problem and area affected on the body. Please use your medication only as directed by your physician to prevent side effects.    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 Improved with topical treatment.   Notalgia paresthetica is a chronic condition affecting the skin of the back in which a pinched nerve along the spine causes itching or changes in sensation in an area of skin. This is usually accompanied by chronic rubbing or scratching. There is no cure, but there are some treatments which may help control the itch.   Over the counter (non-prescription) treatments for notalgia paresthetica include numbing creams like pramoxine or lidocaine which temporarily reduce itch or  Capsaicin-containing creams which cause a burning sensation but which sometimes over time will reset the nerves to stop producing itch.   Cont clobetasol cream Apply qd/bid prn itch. Avoid face, groin, axilla.   Topical steroids (such as triamcinolone, fluocinolone, fluocinonide, mometasone, clobetasol, halobetasol, betamethasone, hydrocortisone) can cause thinning and lightening of the skin if they are used for too long in the same area. Your physician has selected the right strength medicine for your problem and area affected on the  body. Please use your medication only as directed by your physician to prevent side effects.    Related Medications clobetasol cream (TEMOVATE) 7.07 % Apply 1 application topically 2 (two) times daily.   Return in about 6 months (around 10/08/2021) for TBSE, Hx melanoma in situ.  IJamesetta Orleans, CMA, am acting as scribe for Brendolyn Patty, MD . Documentation: I have reviewed the above documentation for accuracy and completeness, and I agree with the above.  Brendolyn Patty MD

## 2021-04-10 ENCOUNTER — Telehealth: Payer: Self-pay

## 2021-04-10 NOTE — Telephone Encounter (Signed)
Patients Tavaborole 5% solution was denied. Patient has not tried Oral Turbinafine, Itraconazole, ciclopirox nail solution or griseofulvin. ?

## 2021-04-10 NOTE — Telephone Encounter (Signed)
Called patient and LVM for her to return our call to go over her other treatment options.  ?

## 2021-05-05 ENCOUNTER — Ambulatory Visit
Admission: RE | Admit: 2021-05-05 | Discharge: 2021-05-05 | Disposition: A | Discharge status: Home / Self Care | Payer: Medicare PPO | Source: Ambulatory Visit | Attending: General Surgery | Admitting: General Surgery

## 2021-05-05 ENCOUNTER — Ambulatory Visit: Admission: RE | Admit: 2021-05-05 | Payer: Medicare PPO | Source: Ambulatory Visit

## 2021-05-05 DIAGNOSIS — Z853 Personal history of malignant neoplasm of breast: Secondary | ICD-10-CM | POA: Diagnosis present

## 2021-05-12 ENCOUNTER — Ambulatory Visit (INDEPENDENT_AMBULATORY_CARE_PROVIDER_SITE_OTHER): Payer: Medicare PPO | Admitting: Internal Medicine

## 2021-05-12 VITALS — BP 125/90 | HR 69 | Resp 16 | Ht 65.0 in | Wt 205.0 lb

## 2021-05-12 DIAGNOSIS — I10 Essential (primary) hypertension: Secondary | ICD-10-CM | POA: Diagnosis not present

## 2021-05-12 DIAGNOSIS — Z9989 Dependence on other enabling machines and devices: Secondary | ICD-10-CM

## 2021-05-12 DIAGNOSIS — G4733 Obstructive sleep apnea (adult) (pediatric): Secondary | ICD-10-CM | POA: Diagnosis not present

## 2021-05-12 DIAGNOSIS — Z7189 Other specified counseling: Secondary | ICD-10-CM | POA: Diagnosis not present

## 2021-05-12 NOTE — Patient Instructions (Signed)

## 2021-05-12 NOTE — Progress Notes (Signed)
[facility] ?[address] ?[place], [place] [address] ? ?Pulmonary Sleep Medicine  ? ?Office Visit Note ? ?Patient Name: Barbara [name] ?DOB: 1950/04/21 ?MRN [id number] ? ? ? ?Chief Complaint: Obstructive Sleep Apnea visit ? ?Brief History: ? ?[name] is seen today for follow up visit.  The patient has a 7 year history of sleep apnea. Patient is using PAP nightly with small N20 nasal mask.  The patient feels fine with energy for the day after sleeping with PAP.  The patient reports benefiting from PAP use. Epworth Sleepiness Score is 2 out of 24. The patient does not take naps. The patient complains of the following: no problems and gets her supplies only as needed.  The compliance download shows  compliance with an average use time of 8:24 hours @ 100%. The AHI is 1.4  The patient does not complain of limb movements disrupting sleep. ? ?ROS ? ?General: (-) fever, (-) chills, (-) night sweat ?Nose and Sinuses: (-) nasal stuffiness or itchiness, (-) postnasal drip, (-) nosebleeds, (-) sinus trouble. ?Mouth and Throat: (-) sore throat, (-) hoarseness. ?Neck: (-) swollen glands, (-) enlarged thyroid, (-) neck pain. ?Respiratory: - cough, - shortness of breath, - wheezing. ?Neurologic: - numbness, - tingling. ?Psychiatric: - anxiety, - depression ? ? ?Current Medication: ?Outpatient Encounter Medications as of 05/12/2021  ?Medication Sig  ? amLODipine (NORVASC) 5 MG tablet Take 5 mg by mouth at bedtime.   ? anastrozole (ARIMIDEX) 1 MG tablet Take 1 tablet by mouth once daily  ? aspirin 81 MG EC tablet Take 81 mg by mouth daily.   ? azelastine (ASTELIN) 0.1 % nasal spray Place into the nose. (Patient not taking: Reported on 03/21/2021)  ? calcipotriene-betamethasone (TACLONEX) external suspension Apply to affected area ears at night until improved.  ? Calcium Carbonate-Vit D-Min (CALCIUM 1200 PO) Take by mouth.  ? cetirizine (ZYRTEC) 10 MG tablet Take 10 mg by mouth every morning.   ? Cholecalciferol (VITAMIN D)  50 MCG (2000 UT) CAPS Take 2,000 Units by mouth daily.  ? CINNAMON PO Take 1,000 mg by mouth in the morning and at bedtime.   ? clobetasol cream (TEMOVATE) 8.14 % Apply 1 application topically 2 (two) times daily.  ? Krill Oil 500 MG CAPS Take 500 mg by mouth daily.  ? lovastatin (MEVACOR) 40 MG tablet Take 40 mg by mouth at bedtime.   ? meloxicam (MOBIC) 15 MG tablet Take 15 mg by mouth daily as needed for pain.  ? mometasone (ELOCON) 0.1 % cream Apply 1 application topically daily as needed (ear irritation).  ? pantoprazole (PROTONIX) 40 MG tablet Take 40 mg by mouth every morning.   ? Tavaborole 5 % SOLN Apply to and under great toenails every night until improved  ? ?No facility-administered encounter medications on file as of 05/12/2021.  ? ? ?Surgical History: ?Past Surgical History:  ?Procedure Laterality Date  ? APPENDECTOMY    ? BREAST BIOPSY Right 10/20/2019  ? Affirm bx-"X" clip path pending  ? BREAST BIOPSY Left 11/06/2020  ? stereo bx, x clip, path pending  ? BREAST BIOPSY Left 11/06/2020  ? Stereo bx-Distortion #2-"Coil" clip-path pending  ? BREAST CYST EXCISION Right   ? BREAST CYST EXCISION Right   ? BREAST CYST EXCISION Left   ? BREAST SURGERY    ? CHOLECYSTECTOMY    ? PART MASTECTOMY,RADIO FREQUENCY LOCALIZER,AXILLARY SENTINEL NODE BIOPSY Right 10/30/2019  ? Procedure: PART [place] NODE BIOPSY;  Surgeon: Barbara Pun, MD;  Location: [facility] ORS;  Service: General;  Laterality: Right;  ? ? ?Medical History: ?Past Medical History:  ?Diagnosis Date  ? Cancer [facility])   ? Family history of breast cancer   ? Family history of lung cancer   ? Family history of pancreatic cancer   ? Family history of uterine cancer   ? GERD (gastroesophageal reflux disease)   ? Headache   ? migraines  ? Hypertension   ? Melanoma ([place]) 08/08/2020  ? 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, R lat mid back, exc 09/18/20  ? Osteopenia 02/15/2020  ? Pre-diabetes   ? Sleep apnea   ?  uses cpap  ? ? ?Family History: ?Non contributory to the present illness ? ?Social History: ?Social History  ? ?Socioeconomic History  ? Marital status: Married  ?  Spouse name: Not on file  ? Number of children: Not on file  ? Years of education: Not on file  ? Highest education level: Not on file  ?Occupational History  ? Not on file  ?Tobacco Use  ? Smoking status: Never  ? Smokeless tobacco: Never  ?Vaping Use  ? Vaping Use: Never used  ?Substance and Sexual Activity  ? Alcohol use: No  ? Drug use: No  ? Sexual activity: Not on file  ?Other Topics Concern  ? Not on file  ?Social History Narrative  ? Not on file  ? ?Social Determinants of Health  ? ?Financial Resource Strain: Not on file  ?Food Insecurity: Not on file  ?Transportation Needs: Not on file  ?Physical Activity: Not on file  ?Stress: Not on file  ?Social Connections: Not on file  ?Intimate Partner Violence: Not on file  ? ? ?Vital Signs: ?There were no vitals taken for this visit. ?There is no height or weight on file to calculate BMI.  ? ? ?Examination: ?General Appearance: The patient is well-developed, well-nourished, and in no distress. ?Neck Circumference: 38 cm ?Skin: Gross inspection of skin unremarkable. ?Head: normocephalic, no gross deformities. ?Eyes: no gross deformities noted. ?ENT: ears appear grossly normal ?Neurologic: Alert and oriented. No involuntary movements. ? ? ? ?EPWORTH SLEEPINESS SCALE: ? ?Scale:  ?(0)= no chance of dozing; (1)= slight chance of dozing; (2)= moderate chance of dozing; (3)= high chance of dozing ? ?Chance  Situtation ?   ?Sitting and reading: 0 ?  ? Watching TV: 0 ?   ?Sitting Inactive in public: 0 ?   ?As a passenger in car: 1   ?   ?Lying down to rest: 1 ?   ?Sitting and talking: 0 ?   ?Sitting quielty after lunch: 0 ?   ?In a car, stopped in traffic: 0 ? ? ?TOTAL SCORE:   2 out of 24 ? ? ? ?SLEEP STUDIES: ? ?PSG 02/26/14 -  Overall AHI of 23; REM AHI 85; SpO2 low of 74% ? ? ?CPAP COMPLIANCE DATA: ? ?Date  Range: 04/08/21 - 05/07/21  ? ?Average Daily Use: 8:24 hours ? ?Median Use: 8:29 hours ? ?Compliance for > 4 Hours: 100% days ? ?AHI: 1.4 respiratory events per hour ? ?Days Used: 30/30 ? ?Mask Leak: 10.7 lpm ? ?95th Percentile Pressure: 10 cmH2O ? ? ?LABS: ?Recent Results (from the past 2160 hour(s))  ?Comprehensive metabolic panel     Status: Abnormal  ? Collection Time: 03/21/21  9:19 AM  ?Result Value Ref Range  ? Sodium 137 135 - 145 mmol/L  ? Potassium 3.9 3.5 - 5.1 mmol/L  ? Chloride 103 98 - 111 mmol/L  ?  CO2 26 22 - 32 mmol/L  ? Glucose, Bld 131 (H) 70 - 99 mg/dL  ?  Comment: Glucose reference range applies only to samples taken after fasting for at least 8 hours.  ? BUN 18 8 - 23 mg/dL  ? Creatinine, Ser 0.89 0.44 - 1.00 mg/dL  ? Calcium 9.5 8.9 - 10.3 mg/dL  ? Total Protein 6.5 6.5 - 8.1 g/dL  ? Albumin 3.8 3.5 - 5.0 g/dL  ? AST 15 15 - 41 U/L  ? ALT 18 0 - 44 U/L  ? Alkaline Phosphatase 63 38 - 126 U/L  ? Total Bilirubin 1.4 (H) 0.3 - 1.2 mg/dL  ? GFR, Estimated >60 >60 mL/min  ?  Comment: (NOTE) ?Calculated using the CKD-EPI Creatinine Equation (2021) ?  ? Anion gap 8 5 - 15  ?  Comment: Performed at [facility], [address], [place], [place] [address]  ?CBC with Differential/Platelet     Status: None  ? Collection Time: 03/21/21  9:19 AM  ?Result Value Ref Range  ? WBC 5.6 4.0 - 10.5 K/uL  ? RBC 5.06 3.87 - 5.11 MIL/uL  ? Hemoglobin 13.9 12.0 - 15.0 g/dL  ? HCT 41.6 36.0 - 46.0 %  ? MCV 82.2 80.0 - 100.0 fL  ? MCH 27.5 26.0 - 34.0 pg  ? MCHC 33.4 30.0 - 36.0 g/dL  ? RDW 14.5 11.5 - 15.5 %  ? Platelets 166 150 - 400 K/uL  ? nRBC 0.0 0.0 - 0.2 %  ? Neutrophils Relative % 67 %  ? Neutro Abs 3.7 1.7 - 7.7 K/uL  ? Lymphocytes Relative 22 %  ? Lymphs Abs 1.2 0.7 - 4.0 K/uL  ? Monocytes Relative 7 %  ? Monocytes Absolute 0.4 0.1 - 1.0 K/uL  ? Eosinophils Relative 3 %  ? Eosinophils Absolute 0.2 0.0 - 0.5 K/uL  ? Basophils Relative 1 %  ? Basophils Absolute 0.0 0.0 - 0.1 K/uL  ? Immature Granulocytes 0  %  ? Abs Immature Granulocytes 0.02 0.00 - 0.07 K/uL  ?  Comment: Performed at [facility], [address], [place], [place] [address]  ? ? ?Radiology: ?MM DIAG BREAST TOMO UNI LEFT ? ?Result Date

## 2021-06-04 ENCOUNTER — Other Ambulatory Visit: Payer: Self-pay | Admitting: Oncology

## 2021-07-23 ENCOUNTER — Encounter: Payer: Self-pay | Admitting: Oncology

## 2021-08-12 ENCOUNTER — Ambulatory Visit: Payer: Medicare PPO | Admitting: Dermatology

## 2021-09-01 ENCOUNTER — Other Ambulatory Visit: Payer: Self-pay | Admitting: Oncology

## 2021-09-04 DIAGNOSIS — Z0000 Encounter for general adult medical examination without abnormal findings: Secondary | ICD-10-CM | POA: Diagnosis not present

## 2021-09-04 DIAGNOSIS — E1122 Type 2 diabetes mellitus with diabetic chronic kidney disease: Secondary | ICD-10-CM | POA: Diagnosis not present

## 2021-09-04 DIAGNOSIS — K219 Gastro-esophageal reflux disease without esophagitis: Secondary | ICD-10-CM | POA: Diagnosis not present

## 2021-09-04 DIAGNOSIS — G4733 Obstructive sleep apnea (adult) (pediatric): Secondary | ICD-10-CM | POA: Diagnosis not present

## 2021-09-04 DIAGNOSIS — E11620 Type 2 diabetes mellitus with diabetic dermatitis: Secondary | ICD-10-CM | POA: Diagnosis not present

## 2021-09-04 DIAGNOSIS — Z1389 Encounter for screening for other disorder: Secondary | ICD-10-CM | POA: Diagnosis not present

## 2021-09-04 DIAGNOSIS — N1831 Chronic kidney disease, stage 3a: Secondary | ICD-10-CM | POA: Diagnosis not present

## 2021-09-04 DIAGNOSIS — E7800 Pure hypercholesterolemia, unspecified: Secondary | ICD-10-CM | POA: Diagnosis not present

## 2021-09-04 DIAGNOSIS — I129 Hypertensive chronic kidney disease with stage 1 through stage 4 chronic kidney disease, or unspecified chronic kidney disease: Secondary | ICD-10-CM | POA: Diagnosis not present

## 2021-09-05 ENCOUNTER — Other Ambulatory Visit: Payer: Self-pay | Admitting: General Surgery

## 2021-09-05 DIAGNOSIS — Z853 Personal history of malignant neoplasm of breast: Secondary | ICD-10-CM

## 2021-09-17 DIAGNOSIS — I10 Essential (primary) hypertension: Secondary | ICD-10-CM | POA: Diagnosis not present

## 2021-09-17 DIAGNOSIS — R519 Headache, unspecified: Secondary | ICD-10-CM | POA: Diagnosis not present

## 2021-09-17 DIAGNOSIS — R059 Cough, unspecified: Secondary | ICD-10-CM | POA: Diagnosis not present

## 2021-09-17 DIAGNOSIS — Z20822 Contact with and (suspected) exposure to covid-19: Secondary | ICD-10-CM | POA: Diagnosis not present

## 2021-09-18 ENCOUNTER — Ambulatory Visit: Payer: Medicare PPO | Admitting: Oncology

## 2021-09-18 ENCOUNTER — Other Ambulatory Visit: Payer: Medicare PPO

## 2021-09-22 ENCOUNTER — Inpatient Hospital Stay: Payer: Medicare PPO

## 2021-09-22 ENCOUNTER — Inpatient Hospital Stay: Payer: Medicare PPO | Attending: Oncology

## 2021-09-22 ENCOUNTER — Encounter: Payer: Self-pay | Admitting: Oncology

## 2021-09-22 ENCOUNTER — Inpatient Hospital Stay: Payer: Medicare PPO | Admitting: Oncology

## 2021-09-22 VITALS — BP 133/67 | HR 72 | Temp 98.7°F | Resp 18 | Wt 179.3 lb

## 2021-09-22 DIAGNOSIS — Z803 Family history of malignant neoplasm of breast: Secondary | ICD-10-CM | POA: Insufficient documentation

## 2021-09-22 DIAGNOSIS — C50211 Malignant neoplasm of upper-inner quadrant of right female breast: Secondary | ICD-10-CM

## 2021-09-22 DIAGNOSIS — M8580 Other specified disorders of bone density and structure, unspecified site: Secondary | ICD-10-CM

## 2021-09-22 DIAGNOSIS — Z800 Family history of malignant neoplasm of digestive organs: Secondary | ICD-10-CM | POA: Insufficient documentation

## 2021-09-22 DIAGNOSIS — R059 Cough, unspecified: Secondary | ICD-10-CM | POA: Diagnosis not present

## 2021-09-22 DIAGNOSIS — R0989 Other specified symptoms and signs involving the circulatory and respiratory systems: Secondary | ICD-10-CM | POA: Diagnosis not present

## 2021-09-22 DIAGNOSIS — Z85820 Personal history of malignant melanoma of skin: Secondary | ICD-10-CM | POA: Diagnosis not present

## 2021-09-22 DIAGNOSIS — G4730 Sleep apnea, unspecified: Secondary | ICD-10-CM | POA: Diagnosis not present

## 2021-09-22 DIAGNOSIS — Z79899 Other long term (current) drug therapy: Secondary | ICD-10-CM | POA: Diagnosis not present

## 2021-09-22 DIAGNOSIS — Z8049 Family history of malignant neoplasm of other genital organs: Secondary | ICD-10-CM | POA: Diagnosis not present

## 2021-09-22 DIAGNOSIS — Z9049 Acquired absence of other specified parts of digestive tract: Secondary | ICD-10-CM | POA: Insufficient documentation

## 2021-09-22 DIAGNOSIS — I10 Essential (primary) hypertension: Secondary | ICD-10-CM | POA: Insufficient documentation

## 2021-09-22 DIAGNOSIS — R0981 Nasal congestion: Secondary | ICD-10-CM | POA: Insufficient documentation

## 2021-09-22 DIAGNOSIS — Z170 Estrogen receptor positive status [ER+]: Secondary | ICD-10-CM | POA: Insufficient documentation

## 2021-09-22 DIAGNOSIS — Z79811 Long term (current) use of aromatase inhibitors: Secondary | ICD-10-CM | POA: Insufficient documentation

## 2021-09-22 DIAGNOSIS — Z801 Family history of malignant neoplasm of trachea, bronchus and lung: Secondary | ICD-10-CM | POA: Insufficient documentation

## 2021-09-22 LAB — COMPREHENSIVE METABOLIC PANEL
ALT: 18 U/L (ref 0–44)
AST: 13 U/L — ABNORMAL LOW (ref 15–41)
Albumin: 4.3 g/dL (ref 3.5–5.0)
Alkaline Phosphatase: 60 U/L (ref 38–126)
Anion gap: 7 (ref 5–15)
BUN: 19 mg/dL (ref 8–23)
CO2: 27 mmol/L (ref 22–32)
Calcium: 9.9 mg/dL (ref 8.9–10.3)
Chloride: 105 mmol/L (ref 98–111)
Creatinine, Ser: 0.92 mg/dL (ref 0.44–1.00)
GFR, Estimated: >60 mL/min (ref >60)
Glucose, Bld: 108 mg/dL — ABNORMAL HIGH (ref 70–99)
Potassium: 4 mmol/L (ref 3.5–5.1)
Sodium: 139 mmol/L (ref 135–145)
Total Bilirubin: 1.9 mg/dL — ABNORMAL HIGH (ref 0.3–1.2)
Total Protein: 7.3 g/dL (ref 6.5–8.1)

## 2021-09-22 LAB — CBC WITH DIFFERENTIAL/PLATELET
Abs Immature Granulocytes: 0.02 10*3/uL (ref 0.00–0.07)
Basophils Absolute: 0 10*3/uL (ref 0.0–0.1)
Basophils Relative: 1 %
Eosinophils Absolute: 0.2 10*3/uL (ref 0.0–0.5)
Eosinophils Relative: 4 %
HCT: 42.6 % (ref 36.0–46.0)
Hemoglobin: 14 g/dL (ref 12.0–15.0)
Immature Granulocytes: 0 %
Lymphocytes Relative: 22 %
Lymphs Abs: 1.4 10*3/uL (ref 0.7–4.0)
MCH: 28 pg (ref 26.0–34.0)
MCHC: 32.9 g/dL (ref 30.0–36.0)
MCV: 85.2 fL (ref 80.0–100.0)
Monocytes Absolute: 0.4 10*3/uL (ref 0.1–1.0)
Monocytes Relative: 6 %
Neutro Abs: 4.3 10*3/uL (ref 1.7–7.7)
Neutrophils Relative %: 67 %
Platelets: 182 10*3/uL (ref 150–400)
RBC: 5 MIL/uL (ref 3.87–5.11)
RDW: 13.8 % (ref 11.5–15.5)
WBC: 6.3 10*3/uL (ref 4.0–10.5)
nRBC: 0 % (ref 0.0–0.2)

## 2021-09-22 MED ORDER — ANASTROZOLE 1 MG PO TABS: 1.0000 mg | ORAL_TABLET | Freq: Every day | ORAL | 3 refills | Status: DC

## 2021-09-22 MED ORDER — SODIUM CHLORIDE 0.9 % IV SOLN
Freq: Once | INTRAVENOUS | Status: AC
  Filled 2021-09-22: qty 250

## 2021-09-22 MED ORDER — ZOLEDRONIC ACID 4 MG/100ML IV SOLN
4.0000 mg | Freq: Once | INTRAVENOUS | Status: AC
  Administered 2021-09-22: 4 mg via INTRAVENOUS
  Filled 2021-09-22: qty 100

## 2021-09-22 NOTE — Progress Notes (Signed)
Hematology/Oncology Progress note [phone] [phone]      Patient Care Team: [name], MD as PCP - General (Family Medicine) Earlie Server, MD as Consulting Physician (Oncology) Noreene Filbert, MD as Referring Physician (Radiation Oncology) Barbara Pun, MD as Consulting Physician (General Surgery)  REFERRING PROVIDER: [name], MD  CHIEF COMPLAINTS/REASON FOR VISIT:  Follow up for breast cancer  HISTORY OF PRESENTING ILLNESS:   Barbara [name] is a  71 y.o.  female with PMH listed below was seen in consultation at the request of  [name], MD  for evaluation of breast cancer  10/13/2019 diagnostic mammogram  Showed 25mm spiculated mass in the right breast 1:00. Unchanged left axillary probabaly benign mass- 1 year stability.  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 ER 90% positive, PR 1-0% positive, HER2 negative.  Patient denies any breast skin changes or nipple discharge  Family history of breast cancer: Barbara [name], and Barbara [name] first cousin Family history of other cancers: lung cancer in mother, pancreatic cancer in father, unknown cancer in pat uncle.   Menarche: 56 or 67 Menopause: age of 8 Number of pregnancies : 2 Age at first live childbirth:2 Used OCP: remote use of OCP for couple of years Used estrogen and progesterone therapy: denies History of Radiation to the chest: denies Previous of breast biopsy: previous biopsies in 1970s  # 10/30/2019, patient underwent right lumpectomy and sentinel lymph node biopsy.  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 4 mm pT1a pN0 tubular carcinoma, grade 1. #01/18/2020, finished adjuvant radiation. #January  2022, started on Arimidex  # Family history of pancreatic cancer and breast cancer.  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  10/30/2020, bilateral diagnostic mammogram showed a subtle left breast architectural distortion without suspicious sonographic correlate. 11/06/2020, left breast upper inner stereotactic biopsy showed benign breast tissue with fibrocystic changes with ductal ectasia.  Xarelto endolitis stromal hyperplasia, usual ductal hyperplasia.  Negative for atypia and malignancy.  Left breast upper inner posterior stereotactic biopsy showed benign breast tissue with sclerosing adenosis.  Fibrocystic changes.  Negative for atypia and malignancy.   INTERVAL HISTORY Barbara [name] is a 71 y.o. female who has above history reviewed by me today presents for follow up visit for management of right breast cancer. Patient reports feeling well.  She has no new complaints. Patient tolerates Arimidex with manageable side effects.  She is on Zometa every 6 months. Denies any new breast concerns. Patient reports cough, congestion and runny nose.  She reports home testing for COVID 19 was negative.   Review of Systems  Constitutional:  Negative for appetite change, chills, fatigue and fever.  HENT:   Negative for hearing loss and voice change.   Eyes:  Negative for eye problems.  Respiratory:  Negative for chest tightness and cough.   Cardiovascular:  Negative for chest pain.  Gastrointestinal:  Negative for abdominal distention, abdominal pain and blood in stool.  Endocrine: Negative for hot flashes.  Genitourinary:  Negative for difficulty urinating and frequency.   Musculoskeletal:  Negative for arthralgias.  Skin:  Negative for itching and rash.  Neurological:  Negative for extremity weakness.  Hematological:  Negative for adenopathy.  Psychiatric/Behavioral:  Negative  for confusion.     MEDICAL HISTORY:   Past Medical History:  Diagnosis Date   Cancer [facility])    Family history of breast cancer    Family history of lung cancer    Family history of pancreatic cancer    Family history of uterine cancer    GERD (gastroesophageal reflux disease)    Headache    migraines   Hypertension    Melanoma ([place]) 08/08/2020   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, R lat mid back, exc 09/18/20   Osteopenia 02/15/2020   Pre-diabetes    Sleep apnea    uses cpap    SURGICAL HISTORY: Past Surgical History:  Procedure Laterality Date   APPENDECTOMY     BREAST BIOPSY Right 10/20/2019   Affirm bx-"X" clip path pending   BREAST BIOPSY Left 11/06/2020   stereo bx, x clip, path pending   BREAST BIOPSY Left 11/06/2020   Stereo bx-Distortion #2-"Coil" clip-path pending   BREAST CYST EXCISION Right    BREAST CYST EXCISION Right    BREAST CYST EXCISION Left    BREAST SURGERY     CHOLECYSTECTOMY     PART MASTECTOMY,RADIO FREQUENCY LOCALIZER,AXILLARY SENTINEL NODE BIOPSY Right 10/30/2019   Procedure: PART MASTECTOMY,RADIO FREQUENCY LOCALIZER,AXILLARY SENTINEL NODE BIOPSY;  Surgeon: Barbara Pun, MD;  Location: [facility] ORS;  Service: General;  Laterality: Right;    SOCIAL HISTORY: Social History   Socioeconomic History   Marital status: Married    Spouse name: Not on file   Number of children: Not on file   Years of education: Not on file   Highest education level: Not on file  Occupational History   Not on file  Tobacco Use   Smoking status: Never   Smokeless tobacco: Never  Vaping Use   Vaping Use: Never used  Substance and Sexual Activity   Alcohol use: No   Drug use: No   Sexual activity: Not on file  Other Topics Concern   Not on file  Social History Narrative   Not on file   Social Determinants of Health   Financial Resource Strain: Not on file  Food Insecurity: Not on file  Transportation Needs: Not on file  Physical Activity: Not on file  Stress: Not on file  Social  Connections: Not on file  Intimate Partner Violence: Not on file    FAMILY HISTORY: Family History  Problem Relation Age of Onset   Breast cancer Paternal Aunt    Lung cancer Mother    Pancreatic cancer Father    Cancer Paternal Uncle        unk type   Cancer Paternal Aunt        unk type, possibly breast   Breast cancer Cousin    Cancer Cousin        unk type    ALLERGIES:  has No Known Allergies.  MEDICATIONS:  Current Outpatient Medications  Medication Sig Dispense Refill   amLODipine (NORVASC) 5 MG tablet Take 5 mg by mouth at bedtime.      aspirin 81 MG EC tablet Take 81 mg by mouth daily.      benzonatate (TESSALON) 100 MG capsule Take 200 mg by mouth every 8 (eight) hours as needed.     Calcium Carbonate-Vit D-Min (CALCIUM 1200 PO) Take by mouth.     cetirizine (ZYRTEC) 10 MG tablet Take 10 mg by mouth every morning.      Cholecalciferol (VITAMIN D) 50 MCG (2000 UT) CAPS Take 2,000 Units by mouth daily.  CINNAMON PO Take 1,000 mg by mouth in the morning and at bedtime.      fluconazole (DIFLUCAN) 200 MG tablet Take by mouth. Take 1 tablet (200 mg total) by mouth once a week for 26 doses     Krill Oil 500 MG CAPS Take 500 mg by mouth daily.     lisinopril (ZESTRIL) 2.5 MG tablet Take 2.5 mg by mouth daily.     lovastatin (MEVACOR) 40 MG tablet Take 40 mg by mouth at bedtime.      meloxicam (MOBIC) 15 MG tablet Take 15 mg by mouth daily as needed for pain.     pantoprazole (PROTONIX) 40 MG tablet Take 40 mg by mouth every morning.      anastrozole (ARIMIDEX) 1 MG tablet Take 1 tablet (1 mg total) by mouth daily. 90 tablet 3   azelastine (ASTELIN) 0.1 % nasal spray Place into the nose. (Patient not taking: Reported on 03/21/2021)     calcipotriene-betamethasone (TACLONEX) external suspension Apply to affected area ears at night until improved. (Patient not taking: Reported on 09/22/2021) 60 g 1   clobetasol cream (TEMOVATE) 6.07 % Apply 1 application topically 2 (two)  times daily. (Patient not taking: Reported on 09/22/2021) 45 g 1   mometasone (ELOCON) 0.1 % cream Apply 1 application topically daily as needed (ear irritation). (Patient not taking: Reported on 09/22/2021)     Tavaborole 5 % SOLN Apply to and under great toenails every night until improved (Patient not taking: Reported on 09/22/2021) 10 mL 3   No current facility-administered medications for this visit.     PHYSICAL EXAMINATION: ECOG PERFORMANCE STATUS: 0 - Asymptomatic Vitals:   09/22/21 1305  BP: 133/67  Pulse: 72  Resp: 18  Temp: 98.7 F (37.1 C)   Filed Weights   09/22/21 1305  Weight: 179 lb 4.8 oz (81.3 kg)    Physical Exam Constitutional:      General: She is not in acute distress. HENT:     Head: Normocephalic and atraumatic.  Eyes:     General: No scleral icterus. Cardiovascular:     Rate and Rhythm: Normal rate and regular rhythm.     Heart sounds: Normal heart sounds.  Pulmonary:     Effort: Pulmonary effort is normal. No respiratory distress.     Breath sounds: No wheezing.  Abdominal:     General: Bowel sounds are normal. There is no distension.     Palpations: Abdomen is soft.  Musculoskeletal:        General: No deformity. Normal range of motion.     Cervical back: Normal range of motion and neck supple.  Skin:    General: Skin is warm and dry.     Findings: No erythema or rash.  Neurological:     Mental Status: She is alert and oriented to person, place, and time. Mental status is at baseline.     Cranial Nerves: No cranial nerve deficit.     Coordination: Coordination normal.  Psychiatric:        Mood and Affect: Mood normal.       LABORATORY DATA:  I have reviewed the data as listed Lab Results  Component Value Date   WBC 6.3 09/22/2021   HGB 14.0 09/22/2021   HCT 42.6 09/22/2021   MCV 85.2 09/22/2021   PLT 182 09/22/2021   Recent Labs    12/06/20 1149 03/21/21 0919 09/22/21 1245  NA 137 137 139  K 3.8 3.9 4.0  CL 104 103  105   CO2 $Re'25 26 27  'wSt$ GLUCOSE 113* 131* 108*  BUN $Re'18 18 19  'XcK$ CREATININE 0.97 0.89 0.92  CALCIUM 9.4 9.5 9.9  GFRNONAA >60 >60 >60  PROT 7.0 6.5 7.3  ALBUMIN 4.0 3.8 4.3  AST 15 15 13*  ALT $Re'18 18 18  'ZWL$ ALKPHOS 61 63 60  BILITOT 1.5* 1.4* 1.9*    Iron/TIBC/Ferritin/ %Sat No results found for: "IRON", "TIBC", "FERRITIN", "IRONPCTSAT"    RADIOGRAPHIC STUDIES: I have personally reviewed the radiological images as listed and agreed with the findings in the report. No results found.    ASSESSMENT & PLAN:  1. Malignant neoplasm of upper-inner quadrant of right breast in female, estrogen receptor positive ([place])   2. Osteopenia, unspecified location   3. Aromatase inhibitor use    Cancer Staging  Malignant neoplasm of upper-inner quadrant of right breast in female, estrogen receptor positive ([place]) Staging form: Breast, AJCC 8th Edition - Clinical stage from 10/26/2019: Stage IA (cT1b, cN0, cM0, G1, ER+, PR+, HER2-) - Signed by Earlie Server, MD on 10/26/2019  #Stage IA right breast cancer, s/p lumpectomy/SLNB and radiation. Labs reviewed and discussed with patient. Continue Arimidex 1 mg daily. Refill sent to pharmacy.  Plan total 5 years of endocrine therapy-January 2027 05/05/2021, left unilateral diagnostic mammogram showed no evidence of malignancy. Plan bilateral diagnostic mammogram in September 2023-scheduled through Dr. Deniece Ree office.  . #Osteopenia  12/27/2019 DEXA showed osteopenia 10-year major osteoporotic fracture right is 9.2%.  -Repeat bone density in November 2023. Continue calcium and vitamin D supplementation Proceed with Zometa today.  #Hyperbilirubinemia-check direct bilirubin Orders Placed This Encounter  Procedures   DG Bone Density    Standing Status:   Future    Standing Expiration Date:   09/22/2022    Order Specific Question:   Reason for Exam (SYMPTOM  OR DIAGNOSIS REQUIRED)    Answer:   history of breast cancer    Order Specific Question:   Preferred imaging  location?    Answer:   [facility]   Bilirubin, direct    Standing Status:   Future    Standing Expiration Date:   09/23/2022   CBC with Differential/Platelet    Standing Status:   Future    Standing Expiration Date:   09/23/2022   Comprehensive metabolic panel    Standing Status:   Future    Standing Expiration Date:   09/22/2022    All questions were answered. The patient knows to call the clinic with any problems questions or concerns.  cc [name], MD    Return of visit: 6 months.Earlie Server, MD, PhD [facility] Hematology Oncology 09/22/2021

## 2021-09-22 NOTE — Progress Notes (Signed)
Pt here for follow up. No new concerns voiced.   

## 2021-09-22 NOTE — Patient Instructions (Signed)
[facility]  Discharge Instructions: Thank you for choosing [facility] to provide your oncology and hematology care.  If you have a lab appointment with the Cancer Center, please go directly to the Cancer Center and check in at the registration area.  Wear comfortable clothing and clothing appropriate for easy access to any Portacath or PICC line.   We strive to give you quality time with your provider. You may need to reschedule your appointment if you arrive late (15 or more minutes).  Arriving late affects you and other patients whose appointments are after yours.  Also, if you miss three or more appointments without notifying the office, you may be dismissed from the clinic at the provider's discretion.      For prescription refill requests, have your pharmacy contact our office and allow 72 hours for refills to be completed.    Today you received the following chemotherapy and/or immunotherapy agents ZOMETA      To help prevent nausea and vomiting after your treatment, we encourage you to take your nausea medication as directed.  BELOW ARE SYMPTOMS THAT SHOULD BE REPORTED IMMEDIATELY: *FEVER GREATER THAN 100.4 F (38 C) OR HIGHER *CHILLS OR SWEATING *NAUSEA AND VOMITING THAT IS NOT CONTROLLED WITH YOUR NAUSEA MEDICATION *UNUSUAL SHORTNESS OF BREATH *UNUSUAL BRUISING OR BLEEDING *URINARY PROBLEMS (pain or burning when urinating, or frequent urination) *BOWEL PROBLEMS (unusual diarrhea, constipation, pain near the anus) TENDERNESS IN MOUTH AND THROAT WITH OR WITHOUT PRESENCE OF ULCERS (sore throat, sores in mouth, or a toothache) UNUSUAL RASH, SWELLING OR PAIN  UNUSUAL VAGINAL DISCHARGE OR ITCHING   Items with * indicate a potential emergency and should be followed up as soon as possible or go to the Emergency Department if any problems should occur.  Please show the CHEMOTHERAPY ALERT CARD or IMMUNOTHERAPY ALERT CARD at check-in to the  Emergency Department and triage nurse.  Should you have questions after your visit or need to cancel or reschedule your appointment, please contact [facility]  [phone] and follow the prompts.  Office hours are 8:00 a.m. to 4:30 p.m. Monday - Friday. Please note that voicemails left after 4:00 p.m. may not be returned until the following business day.  We are closed weekends and major holidays. You have access to a nurse at all times for urgent questions. Please call the main number to the clinic [phone] and follow the prompts.  For any non-urgent questions, you may also contact your provider using MyChart. We now offer e-Visits for anyone 18 and older to request care online for non-urgent symptoms. For details visit [email or web address].   Also download the MyChart app! Go to the app store, search "MyChart", open the app, select [facility], and log in with your MyChart username and password.  Masks are optional in the cancer centers. If you would like for your care team to wear a mask while they are taking care of you, please let them know. For doctor visits, patients may have with them one support person who is at least 71 years old. At this time, visitors are not allowed in the infusion area.  Zoledronic Acid Injection (Cancer) What is this medication? ZOLEDRONIC ACID (ZOE le dron ik AS id) treats high calcium levels in the blood caused by cancer. It may also be used with chemotherapy to treat weakened bones caused by cancer. It works by slowing down the release of calcium from bones. This lowers calcium levels in   your blood. It also makes your bones stronger and less likely to break (fracture). It belongs to a group of medications called bisphosphonates. This medicine may be used for other purposes; ask your health care provider or pharmacist if you have questions. COMMON BRAND NAME(S): Zometa, Zometa Powder What should I tell my care team before I  take this medication? They need to know if you have any of these conditions: Dehydration Dental disease Kidney disease Liver disease Low levels of calcium in the blood Lung or breathing disease, such as asthma Receiving steroids, such as dexamethasone or prednisone An unusual or allergic reaction to zoledronic acid, other medications, foods, dyes, or preservatives Pregnant or trying to get pregnant Breast-feeding How should I use this medication? This medication is injected into a vein. It is given by your care team in a hospital or clinic setting. Talk to your care team about the use of this medication in children. Special care may be needed. Overdosage: If you think you have taken too much of this medicine contact a poison control center or emergency room at once. NOTE: This medicine is only for you. Do not share this medicine with others. What if I miss a dose? Keep appointments for follow-up doses. It is important not to miss your dose. Call your care team if you are unable to keep an appointment. What may interact with this medication? Certain antibiotics given by injection Diuretics, such as bumetanide, furosemide NSAIDs, medications for pain and inflammation, such as ibuprofen or naproxen Teriparatide Thalidomide This list may not describe all possible interactions. Give your health care provider a list of all the medicines, herbs, non-prescription drugs, or dietary supplements you use. Also tell them if you smoke, drink alcohol, or use illegal drugs. Some items may interact with your medicine. What should I watch for while using this medication? Visit your care team for regular checks on your progress. It may be some time before you see the benefit from this medication. Some people who take this medication have severe bone, joint, or muscle pain. This medication may also increase your risk for jaw problems or a broken thigh bone. Tell your care team right away if you have severe  pain in your jaw, bones, joints, or muscles. Tell you care team if you have any pain that does not go away or that gets worse. Tell your dentist and dental surgeon that you are taking this medication. You should not have major dental surgery while on this medication. See your dentist to have a dental exam and fix any dental problems before starting this medication. Take good care of your teeth while on this medication. Make sure you see your dentist for regular follow-up appointments. You should make sure you get enough calcium and vitamin D while you are taking this medication. Discuss the foods you eat and the vitamins you take with your care team. Check with your care team if you have severe diarrhea, nausea, and vomiting, or if you sweat a lot. The loss of too much body fluid may make it dangerous for you to take this medication. You may need bloodwork while taking this medication. Talk to your care team if you wish to become pregnant or think you might be pregnant. This medication can cause serious birth defects. What side effects may I notice from receiving this medication? Side effects that you should report to your care team as soon as possible: Allergic reactions--skin rash, itching, hives, swelling of the face, lips, tongue, or throat   Kidney injury--decrease in the amount of urine, swelling of the ankles, hands, or feet Low calcium level--muscle pain or cramps, confusion, tingling, or numbness in the hands or feet Osteonecrosis of the jaw--pain, swelling, or redness in the mouth, numbness of the jaw, poor healing after dental work, unusual discharge from the mouth, visible bones in the mouth Severe bone, joint, or muscle pain Side effects that usually do not require medical attention (report to your care team if they continue or are bothersome): Constipation Fatigue Fever Loss of appetite Nausea Stomach pain This list may not describe all possible side effects. Call your doctor for  medical advice about side effects. You may report side effects to FDA at 1-800-FDA-1088. Where should I keep my medication? This medication is given in a hospital or clinic. It will not be stored at home. NOTE: This sheet is a summary. It may not cover all possible information. If you have questions about this medicine, talk to your doctor, pharmacist, or health care provider.  2023 Elsevier/Gold Standard (2021-03-06 00:00:00)    

## 2021-09-23 ENCOUNTER — Other Ambulatory Visit: Payer: Self-pay

## 2021-09-23 DIAGNOSIS — C50211 Malignant neoplasm of upper-inner quadrant of right female breast: Secondary | ICD-10-CM

## 2021-09-23 LAB — BILIRUBIN, DIRECT: Bilirubin, Direct: 0.3 mg/dL — ABNORMAL HIGH (ref 0.0–0.2)

## 2021-10-15 DIAGNOSIS — E119 Type 2 diabetes mellitus without complications: Secondary | ICD-10-CM | POA: Diagnosis not present

## 2021-10-15 DIAGNOSIS — I10 Essential (primary) hypertension: Secondary | ICD-10-CM | POA: Diagnosis not present

## 2021-10-15 DIAGNOSIS — N1831 Chronic kidney disease, stage 3a: Secondary | ICD-10-CM | POA: Diagnosis not present

## 2021-10-31 ENCOUNTER — Other Ambulatory Visit: Payer: Self-pay | Admitting: General Surgery

## 2021-10-31 ENCOUNTER — Ambulatory Visit
Admission: RE | Admit: 2021-10-31 | Discharge: 2021-10-31 | Disposition: A | Discharge status: Home / Self Care | Payer: Medicare PPO | Source: Ambulatory Visit | Attending: General Surgery | Admitting: General Surgery

## 2021-10-31 DIAGNOSIS — Z853 Personal history of malignant neoplasm of breast: Secondary | ICD-10-CM | POA: Insufficient documentation

## 2021-10-31 DIAGNOSIS — R921 Mammographic calcification found on diagnostic imaging of breast: Secondary | ICD-10-CM

## 2021-10-31 DIAGNOSIS — R928 Other abnormal and inconclusive findings on diagnostic imaging of breast: Secondary | ICD-10-CM

## 2021-10-31 DIAGNOSIS — N6011 Diffuse cystic mastopathy of right breast: Secondary | ICD-10-CM | POA: Diagnosis not present

## 2021-10-31 HISTORY — DX: Personal history of irradiation: Z92.3

## 2021-11-03 ENCOUNTER — Encounter: Payer: Self-pay | Admitting: Dermatology

## 2021-11-03 ENCOUNTER — Ambulatory Visit (INDEPENDENT_AMBULATORY_CARE_PROVIDER_SITE_OTHER): Payer: Medicare PPO | Admitting: Dermatology

## 2021-11-03 DIAGNOSIS — L219 Seborrheic dermatitis, unspecified: Secondary | ICD-10-CM

## 2021-11-03 DIAGNOSIS — D2271 Melanocytic nevi of right lower limb, including hip: Secondary | ICD-10-CM

## 2021-11-03 DIAGNOSIS — Z872 Personal history of diseases of the skin and subcutaneous tissue: Secondary | ICD-10-CM

## 2021-11-03 DIAGNOSIS — D2261 Melanocytic nevi of right upper limb, including shoulder: Secondary | ICD-10-CM

## 2021-11-03 DIAGNOSIS — L821 Other seborrheic keratosis: Secondary | ICD-10-CM

## 2021-11-03 DIAGNOSIS — L820 Inflamed seborrheic keratosis: Secondary | ICD-10-CM

## 2021-11-03 DIAGNOSIS — D225 Melanocytic nevi of trunk: Secondary | ICD-10-CM

## 2021-11-03 DIAGNOSIS — D2262 Melanocytic nevi of left upper limb, including shoulder: Secondary | ICD-10-CM

## 2021-11-03 DIAGNOSIS — B351 Tinea unguium: Secondary | ICD-10-CM

## 2021-11-03 DIAGNOSIS — D229 Melanocytic nevi, unspecified: Secondary | ICD-10-CM

## 2021-11-03 DIAGNOSIS — L578 Other skin changes due to chronic exposure to nonionizing radiation: Secondary | ICD-10-CM

## 2021-11-03 DIAGNOSIS — D239 Other benign neoplasm of skin, unspecified: Secondary | ICD-10-CM

## 2021-11-03 DIAGNOSIS — Z86006 Personal history of melanoma in-situ: Secondary | ICD-10-CM | POA: Diagnosis not present

## 2021-11-03 DIAGNOSIS — L7211 Pilar cyst: Secondary | ICD-10-CM | POA: Diagnosis not present

## 2021-11-03 DIAGNOSIS — Z1283 Encounter for screening for malignant neoplasm of skin: Secondary | ICD-10-CM

## 2021-11-03 DIAGNOSIS — D235 Other benign neoplasm of skin of trunk: Secondary | ICD-10-CM | POA: Diagnosis not present

## 2021-11-03 DIAGNOSIS — L814 Other melanin hyperpigmentation: Secondary | ICD-10-CM

## 2021-11-03 DIAGNOSIS — M65351 Trigger finger, right little finger: Secondary | ICD-10-CM | POA: Diagnosis not present

## 2021-11-03 DIAGNOSIS — M65341 Trigger finger, right ring finger: Secondary | ICD-10-CM | POA: Diagnosis not present

## 2021-11-03 DIAGNOSIS — D1801 Hemangioma of skin and subcutaneous tissue: Secondary | ICD-10-CM

## 2021-11-03 DIAGNOSIS — I781 Nevus, non-neoplastic: Secondary | ICD-10-CM

## 2021-11-03 DIAGNOSIS — M65342 Trigger finger, left ring finger: Secondary | ICD-10-CM | POA: Diagnosis not present

## 2021-11-03 MED ORDER — FLUCONAZOLE 200 MG PO TABS: 200.0000 mg | ORAL_TABLET | Freq: Every day | ORAL | 0 refills | Status: DC

## 2021-11-03 NOTE — Patient Instructions (Addendum)
Fluconazole '200mg'$   Will take 1 pill a week  Side effects of fluconazole (diflucan) include nausea, diarrhea, headache, dizziness, taste changes, rare risk of irritation of the liver, allergy, or decreased blood counts (which could show up as infection or tiredness).   Cryotherapy Aftercare  Wash gently with soap and water everyday.   Apply Vaseline and Band-Aid daily until healed.     Seborrheic Keratosis  What causes seborrheic keratoses? Seborrheic keratoses are harmless, common skin growths that first appear during adult life.  As time goes by, more growths appear.  Some people may develop a large number of them.  Seborrheic keratoses appear on both covered and uncovered body parts.  They are not caused by sunlight.  The tendency to develop seborrheic keratoses can be inherited.  They vary in color from skin-colored to gray, brown, or even black.  They can be either smooth or have a rough, warty surface.   Seborrheic keratoses are superficial and look as if they were stuck on the skin.  Under the microscope this type of keratosis looks like layers upon layers of skin.  That is why at times the top layer may seem to fall off, but the rest of the growth remains and re-grows.    Treatment Seborrheic keratoses do not need to be treated, but can easily be removed in the office.  Seborrheic keratoses often cause symptoms when they rub on clothing or jewelry.  Lesions can be in the way of shaving.  If they become inflamed, they can cause itching, soreness, or burning.  Removal of a seborrheic keratosis can be accomplished by freezing, burning, or surgery. If any spot bleeds, scabs, or grows rapidly, please return to have it checked, as these can be an indication of a skin cancer.   Due to recent changes in healthcare laws, you may see results of your pathology and/or laboratory studies on MyChart before the doctors have had a chance to review them. We understand that in some cases there may be  results that are confusing or concerning to you. Please understand that not all results are received at the same time and often the doctors may need to interpret multiple results in order to provide you with the best plan of care or course of treatment. Therefore, we ask that you please give [place] 2 business days to thoroughly review all your results before contacting the office for clarification. Should we see a critical lab result, you will be contacted sooner.   If You Need Anything After Your Visit  If you have any questions or concerns for your doctor, please call our main line at [phone] and press option 4 to reach your doctor's medical assistant. If no one answers, please leave a voicemail as directed and we will return your call as soon as possible. Messages left after 4 pm will be answered the following business day.   You may also send [place] a message via [place]. We typically respond to MyChart messages within 1-2 business days.  For prescription refills, please ask your pharmacy to contact our office. Our fax number is [phone].  If you have an urgent issue when the clinic is closed that cannot wait until the next business day, you can page your doctor at the number below.    Please note that while we do our best to be available for urgent issues outside of office hours, we are not available 24/7.   If you have an urgent issue and are unable to reach  [place], you may choose to seek medical care at your doctor's office, retail clinic, urgent care center, or emergency room.  If you have a medical emergency, please immediately call 911 or go to the emergency department.  Pager Numbers  - Dr. Nehemiah Massed: [phone]  - Dr. Laurence Ferrari: [phone]  - Dr. Nicole Kindred: [phone]  In the event of inclement weather, please call our main line at [phone] for an update on the status of any delays or closures.  Dermatology Medication Tips: Please keep the boxes that topical medications come  in in order to help keep track of the instructions about where and how to use these. Pharmacies typically print the medication instructions only on the boxes and not directly on the medication tubes.   If your medication is too expensive, please contact our office at [phone] option 4 or send [place] a message through [place].   We are unable to tell what your co-pay for medications will be in advance as this is different depending on your insurance coverage. However, we may be able to find a substitute medication at lower cost or fill out paperwork to get insurance to cover a needed medication.   If a prior authorization is required to get your medication covered by your insurance company, please allow [place] 1-2 business days to complete this process.  Drug prices often vary depending on where the prescription is filled and some pharmacies may offer cheaper prices.  The website www.goodrx.com contains coupons for medications through different pharmacies. The prices here do not account for what the cost may be with help from insurance (it may be cheaper with your insurance), but the website can give you the price if you did not use any insurance.  - You can print the associated coupon and take it with your prescription to the pharmacy.  - You may also stop by our office during regular business hours and pick up a GoodRx coupon card.  - If you need your prescription sent electronically to a different pharmacy, notify our office through [facility] or by phone at [phone] option 4.     Si Usted Necesita Algo Despus de Su Visita  Tambin puede enviarnos un mensaje a travs de Pharmacist, community. Por lo general respondemos a los mensajes de MyChart en el transcurso de 1 a 2 das hbiles.  Para renovar recetas, por favor pida a su farmacia que se ponga en contacto con nuestra oficina. Harland Dingwall de fax es [place] [phone].  Si tiene un asunto urgente cuando la clnica est cerrada y que no puede  esperar hasta el siguiente da hbil, puede llamar/localizar a su doctor(a) al nmero que aparece a continuacin.   Por favor, tenga en cuenta que aunque hacemos todo lo posible para estar disponibles para asuntos urgentes fuera del horario de [place], no estamos disponibles las 24 horas del da, los 7 das de la [place].   Si tiene un problema urgente y no puede comunicarse con nosotros, puede optar por buscar atencin mdica  en el consultorio de su doctor(a), en una clnica privada, en un centro de atencin urgente o en una sala de emergencias.  Si tiene Engineering geologist, por favor llame inmediatamente al 911 o vaya a la sala de emergencias.  Nmeros de bper  - Dr. Nehemiah Massed: [phone]  - Dra. Moye: [phone]  - Dra. Nicole Kindred: [phone]  En caso de inclemencias del [place], por favor llame a Johnsie Kindred principal al [phone] para una actualizacin sobre el [place] de cualquier Shelda Jakes  o cierre.  Consejos para la medicacin en dermatologa: Por favor, guarde las cajas en las que vienen los medicamentos de uso tpico para ayudarle a seguir las instrucciones sobre dnde y cmo usarlos. Las farmacias generalmente imprimen las instrucciones del medicamento slo en las cajas y no directamente en los tubos del [place].   Si su medicamento es muy caro, por favor, pngase en contacto con Zigmund Daniel llamando al [phone] y presione la opcin 4 o envenos un mensaje a travs de Pharmacist, community.   No podemos decirle cul ser su copago por los medicamentos por adelantado ya que esto es diferente dependiendo de la cobertura de su seguro. Sin embargo, es posible que podamos encontrar un medicamento sustituto a Electrical engineer un formulario para que el seguro cubra el medicamento que se considera necesario.   Si se requiere una autorizacin previa para que su compaa de seguros [place] su medicamento, por favor permtanos de 1 a 2 das hbiles para completar este proceso.  Los  precios de los medicamentos varan con frecuencia dependiendo del Environmental consultant de dnde se surte la receta y alguna farmacias pueden ofrecer precios ms baratos.  El sitio web www.goodrx.com tiene cupones para medicamentos de Airline pilot. Los precios aqu no tienen en cuenta lo que podra costar con la ayuda del seguro (puede ser ms barato con su seguro), pero el sitio web puede darle el precio si no utiliz Research scientist (physical sciences).  - Puede imprimir el cupn correspondiente y llevarlo con su receta a la farmacia.  - Tambin puede pasar por nuestra oficina durante el horario de atencin regular y Charity fundraiser una tarjeta de cupones de GoodRx.  - Si necesita que su receta se enve electrnicamente a una farmacia diferente, informe a nuestra oficina a travs de MyChart de [place] o por telfono llamando al [phone] y presione la opcin 4.

## 2021-11-03 NOTE — Progress Notes (Signed)
Follow-Up Visit   Subjective  Barbara [name] is a 71 y.o. 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  The patient presents for Total-Body Skin Exam (TBSE) for skin cancer screening and mole check.  The patient has spots, moles and lesions to be evaluated, some may be new or changing and the patient has concerns that these could be cancer. Some spots are irritated around bra area.  The following portions of the chart were reviewed this encounter and updated as appropriate:       Review of Systems:  No other skin or systemic complaints except as noted in HPI or Assessment and Plan.  Objective  Well appearing patient in no apparent distress; mood and affect are within normal limits.  A full examination was performed including scalp, head, eyes, ears, nose, lips, neck, chest, axillae, abdomen, back, buttocks, bilateral upper extremities, bilateral lower extremities, hands, feet, fingers, toes, fingernails, and toenails. All findings within normal limits unless otherwise noted below.  R lat mid back Well healed scar with no evidence of recurrence  Left Breast [place] L breast  R great toenail Thickening with subungual debris R great toenail           R mid back at braline x 5 (5) Stuck on waxy paps with erythema  R dorsum hand; L post upper arm; R med pretibia; R mid back  R mid back 2.554mmed brown macule  L post upper arm 4.41m56m tone brown pap with emerging hair  R med pretibia 3.41mm341mown macule  bil legs Telangiectasias legs  R parital scalp 1.5cm firm sub q nodule  bil ears Pink scaliness ears    Assessment & Plan   Lentigines - Scattered tan macules - Due to sun exposure -  Benign-appearing, observe - Recommend daily broad spectrum sunscreen SPF 30+ to sun-exposed areas, reapply every 2 hours as needed. - Call for any changes - back, face  Seborrheic Keratoses - Stuck-on, waxy, tan-brown papules and/or plaques  - Benign-appearing - Discussed benign etiology and prognosis. - Observe - Call for any changes - back  Melanocytic Nevi - Tan-brown and/or pink-flesh-colored symmetric macules and papules - Benign appearing on exam today - Observation - Call clinic for new or changing moles - Recommend daily use of broad spectrum spf 30+ sunscreen to sun-exposed areas.  - face  Hemangiomas - Red papules - Discussed benign nature - Observe - Call for any changes - R knee  Actinic Damage - Chronic condition, secondary to cumulative UV/sun exposure - diffuse scaly erythematous macules with underlying dyspigmentation - Recommend daily broad spectrum sunscreen SPF 30+ to sun-exposed areas, reapply every 2 hours as needed.  - Staying in the shade or wearing long sleeves, sun glasses (UVA+UVB protection) and wide brim hats (4-inch brim around the entire circumference of the hat) are also recommended for sun protection.  - Call for new or changing lesions.  Skin cancer screening performed today.   History of PreCancerous Actinic Keratosis  - site(s) of PreCancerous Actinic Keratosis clear today. 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 Observe for  recurrence. Call clinic for new or changing lesions.  Recommend regular skin exams, daily broad-spectrum spf 30+ sunscreen use, and photoprotection.    Dilated pore of [place] Left Breast  Symptomatic, irritating, patient would like treated. Extraction today x , may recur  Acne/Milia surgery - Left Breast Procedure risks and benefits were discussed with the patient and verbal consent was obtained. Following prep of the skin on the L breast with an alcohol swab, extraction of comedones was performed with a comedone extractor. Vaseline ointment was applied to each site. The patient tolerated the procedure well.  Onychomycosis R great toenail  Chronic fungal infection of toenail, discussed can take a year of treatment to see clearance/improvement.  Cont Fluconazole '200mg'$  1 po q wk (pt has been on x 64m- written daily, but pt will take weekly as directed.  Will be on total of at least 9 months. Discussed not taking Lovastatin on day she takes Fluconazole due to potential drug interactions  Side effects of fluconazole (diflucan) include nausea, diarrhea, headache, dizziness, taste changes, rare risk of irritation of the liver, allergy, or decreased blood counts (which could show up as infection or tiredness).   fluconazole (DIFLUCAN) 200 MG tablet - R great toenail Take 1 tablet (200 mg total) by mouth daily.  Inflamed seborrheic keratosis (5) R mid back at braline x 5  Symptomatic, irritating, patient would like treated.   Destruction of lesion - R mid back at braline x 5  Destruction method: cryotherapy   Informed consent: discussed and consent obtained   Lesion destroyed using liquid nitrogen: Yes   Region frozen until ice ball extended beyond lesion: Yes   Outcome: patient tolerated procedure well with no complications   Post-procedure details: wound care instructions given   Additional details:  Prior to procedure, discussed risks of blister formation, small wound, skin  dyspigmentation, or rare scar following cryotherapy. Recommend Vaseline ointment to treated areas while healing.   Nevus (4) R dorsum hand; L post upper arm; R med pretibia; R mid back  Benign-appearing. Stable compared to previous visit. Observation.  Call clinic for new or changing moles.  Recommend daily use of broad spectrum spf 30+ sunscreen to sun-exposed areas.    R dorsum hand Nevus vs SK  Spider veins bil legs  Benign, observe  Pilar cyst R parital scalp  Benign-appearing. Exam most consistent with a pilar cyst. Discussed that a cyst is a benign growth that can grow over time and sometimes get irritated or inflamed. Recommend observation if it is not bothersome. Discussed option of surgical excision to remove it if it is growing, symptomatic, or other changes noted. Please call for new or changing lesions so they can be evaluated.   Seborrheic dermatitis bil ears  Chronic condition with duration or expected duration over one year. Currently well-controlled.   Seborrheic Dermatitis  -  is a chronic persistent rash characterized by pinkness and scaling most commonly of the mid face but also can occur on the scalp (dandruff), ears; mid chest, mid back and groin.  It tends to be exacerbated by stress and cooler weather.  People who have neurologic disease  may experience new onset or exacerbation of existing seborrheic dermatitis.  The condition is not curable but treatable and can be controlled.  Cont Mometasone cr qd prn flares  Topical steroids (such as triamcinolone, fluocinolone, fluocinonide, mometasone, clobetasol, halobetasol, betamethasone, hydrocortisone) can cause thinning and lightening of the skin if they are used for too long in the same area. Your physician has selected the right strength medicine for your problem and area affected on the body. Please use your medication only as directed by your physician to prevent side effects.     Return in about 6 months (around  05/05/2022) for TBSE, Hx of Melanoma IS, Hx of AKs.  I, Othelia Pulling, RMA, am acting as scribe for Brendolyn Patty, MD   Documentation: I have reviewed the above documentation for accuracy and completeness, and I agree with the above.  Brendolyn Patty MD

## 2021-11-12 ENCOUNTER — Ambulatory Visit
Admission: RE | Admit: 2021-11-12 | Discharge: 2021-11-12 | Disposition: A | Discharge status: Home / Self Care | Payer: Medicare PPO | Source: Ambulatory Visit | Attending: General Surgery | Admitting: General Surgery

## 2021-11-12 DIAGNOSIS — N6082 Other benign mammary dysplasias of left breast: Secondary | ICD-10-CM | POA: Diagnosis not present

## 2021-11-12 DIAGNOSIS — R928 Other abnormal and inconclusive findings on diagnostic imaging of breast: Secondary | ICD-10-CM | POA: Diagnosis not present

## 2021-11-12 DIAGNOSIS — R921 Mammographic calcification found on diagnostic imaging of breast: Secondary | ICD-10-CM

## 2021-11-12 HISTORY — PX: BREAST BIOPSY: SHX20

## 2021-11-13 LAB — SURGICAL PATHOLOGY

## 2021-11-18 DIAGNOSIS — C50211 Malignant neoplasm of upper-inner quadrant of right female breast: Secondary | ICD-10-CM | POA: Diagnosis not present

## 2021-12-09 DIAGNOSIS — M7661 Achilles tendinitis, right leg: Secondary | ICD-10-CM | POA: Diagnosis not present

## 2021-12-09 DIAGNOSIS — M7731 Calcaneal spur, right foot: Secondary | ICD-10-CM | POA: Diagnosis not present

## 2021-12-29 ENCOUNTER — Ambulatory Visit
Admission: RE | Admit: 2021-12-29 | Discharge: 2021-12-29 | Disposition: A | Discharge status: Home / Self Care | Payer: Medicare PPO | Source: Ambulatory Visit | Attending: Oncology | Admitting: Oncology

## 2021-12-29 DIAGNOSIS — M8589 Other specified disorders of bone density and structure, multiple sites: Secondary | ICD-10-CM | POA: Diagnosis not present

## 2021-12-29 DIAGNOSIS — Z780 Asymptomatic menopausal state: Secondary | ICD-10-CM | POA: Insufficient documentation

## 2021-12-29 DIAGNOSIS — Z853 Personal history of malignant neoplasm of breast: Secondary | ICD-10-CM | POA: Diagnosis not present

## 2021-12-29 DIAGNOSIS — Z13820 Encounter for screening for osteoporosis: Secondary | ICD-10-CM | POA: Diagnosis not present

## 2021-12-29 DIAGNOSIS — M8580 Other specified disorders of bone density and structure, unspecified site: Secondary | ICD-10-CM | POA: Insufficient documentation

## 2021-12-29 DIAGNOSIS — C50211 Malignant neoplasm of upper-inner quadrant of right female breast: Secondary | ICD-10-CM | POA: Insufficient documentation

## 2021-12-29 DIAGNOSIS — Z170 Estrogen receptor positive status [ER+]: Secondary | ICD-10-CM | POA: Insufficient documentation

## 2022-02-16 ENCOUNTER — Ambulatory Visit: Payer: Medicare PPO | Admitting: Radiation Oncology

## 2022-02-22 DIAGNOSIS — G4733 Obstructive sleep apnea (adult) (pediatric): Secondary | ICD-10-CM | POA: Diagnosis not present

## 2022-02-23 ENCOUNTER — Ambulatory Visit
Admission: RE | Admit: 2022-02-23 | Discharge: 2022-02-23 | Disposition: A | Discharge status: Home / Self Care | Payer: Medicare PPO | Source: Ambulatory Visit | Attending: Radiation Oncology | Admitting: Radiation Oncology

## 2022-02-23 ENCOUNTER — Encounter: Payer: Self-pay | Admitting: Radiation Oncology

## 2022-02-23 VITALS — BP 140/75 | HR 75 | Temp 98.2°F | Resp 16 | Ht 65.0 in | Wt 185.0 lb

## 2022-02-23 DIAGNOSIS — Z79811 Long term (current) use of aromatase inhibitors: Secondary | ICD-10-CM | POA: Diagnosis not present

## 2022-02-23 DIAGNOSIS — Z923 Personal history of irradiation: Secondary | ICD-10-CM | POA: Insufficient documentation

## 2022-02-23 DIAGNOSIS — Z171 Estrogen receptor negative status [ER-]: Secondary | ICD-10-CM | POA: Insufficient documentation

## 2022-02-23 DIAGNOSIS — C50211 Malignant neoplasm of upper-inner quadrant of right female breast: Secondary | ICD-10-CM | POA: Diagnosis not present

## 2022-02-23 DIAGNOSIS — Z170 Estrogen receptor positive status [ER+]: Secondary | ICD-10-CM | POA: Diagnosis not present

## 2022-02-23 NOTE — Progress Notes (Signed)
Radiation Oncology Follow up Note  Name: Barbara [name]   Date:   02/23/2022 MRN:  [id number] DOB: May 10, 1950    This 72 y.o. female presents to the clinic today for 2-year follow-up status post whole breast radiation to her right breast for stage Ia ER/PR positive invasive mammary carcinoma.  REFERRING PROVIDER: [name], MD  HPI: Patient is a 72 year old female now out 2 years having completed whole breast radiation to her right breast for stage Ia ER/PR positive invasive mammary carcinoma.  Seen today in routine follow-up she is doing well.  She specifically denies breast tenderness cough or bone pain.  She did have mammograms back in September showing a 9 mm group of indeterminate amorphous calcifications which were biopsied and showed dystrophic calcifications with no evidence of malignancy.  She is currently on Arimidex that well without side effect.  COMPLICATIONS OF TREATMENT: none  FOLLOW UP COMPLIANCE: keeps appointments   PHYSICAL EXAM:  BP (!) 140/75 (BP Location: Left Arm, Patient Position: Sitting, Cuff Size: Small)   Pulse 75   Temp 98.2 F (36.8 C) (Tympanic)   Resp 16   Ht '5\' 5"'$  (1.651 m)   Wt 185 lb (83.9 kg)   BMI 30.79 kg/m  Lungs are clear to A&P cardiac examination essentially unremarkable with regular rate and rhythm. No dominant mass or nodularity is noted in either breast in 2 positions examined. Incision is well-healed. No axillary or supraclavicular adenopathy is appreciated. Cosmetic result is excellent.  Well-developed well-nourished patient in NAD. HEENT reveals PERLA, EOMI, discs not visualized.  Oral cavity is clear. No oral mucosal lesions are identified. Neck is clear without evidence of cervical or supraclavicular adenopathy. Lungs are clear to A&P. Cardiac examination is essentially unremarkable with regular rate and rhythm without murmur rub or thrill. Abdomen is benign with no organomegaly or masses noted. Motor sensory and DTR levels are  equal and symmetric in the upper and lower extremities. Cranial nerves II through XII are grossly intact. Proprioception is intact. No peripheral adenopathy or edema is identified. No motor or sensory levels are noted. Crude visual fields are within normal range.  RADIOLOGY RESULTS: Mammograms reviewed compatible with above-stated findings pathology reports also reviewed  PLAN: Present time patient is now at 2 years with no evidence of disease.  I have asked to see her back in 1 year for follow-up and then will discontinue follow-up care.  Patient knows to call at anytime with any concerns.  She continues on Arimidex without side effect.  I would like to take this opportunity to thank you for allowing me to participate in the care of your patient.Noreene Filbert, MD

## 2022-03-25 ENCOUNTER — Inpatient Hospital Stay: Payer: Medicare PPO | Attending: Oncology

## 2022-03-25 ENCOUNTER — Inpatient Hospital Stay: Payer: Medicare PPO

## 2022-03-25 ENCOUNTER — Encounter: Payer: Self-pay | Admitting: Oncology

## 2022-03-25 ENCOUNTER — Inpatient Hospital Stay (HOSPITAL_BASED_OUTPATIENT_CLINIC_OR_DEPARTMENT_OTHER): Payer: Medicare PPO | Admitting: Oncology

## 2022-03-25 VITALS — BP 111/75 | HR 72 | Temp 97.1°F | Resp 18 | Wt 187.1 lb

## 2022-03-25 DIAGNOSIS — Z85820 Personal history of malignant melanoma of skin: Secondary | ICD-10-CM | POA: Insufficient documentation

## 2022-03-25 DIAGNOSIS — Z1509 Genetic susceptibility to other malignant neoplasm: Secondary | ICD-10-CM | POA: Insufficient documentation

## 2022-03-25 DIAGNOSIS — G4733 Obstructive sleep apnea (adult) (pediatric): Secondary | ICD-10-CM | POA: Diagnosis not present

## 2022-03-25 DIAGNOSIS — Z79899 Other long term (current) drug therapy: Secondary | ICD-10-CM | POA: Diagnosis not present

## 2022-03-25 DIAGNOSIS — C50211 Malignant neoplasm of upper-inner quadrant of right female breast: Secondary | ICD-10-CM | POA: Insufficient documentation

## 2022-03-25 DIAGNOSIS — Z170 Estrogen receptor positive status [ER+]: Secondary | ICD-10-CM

## 2022-03-25 DIAGNOSIS — Z1501 Genetic susceptibility to malignant neoplasm of breast: Secondary | ICD-10-CM | POA: Insufficient documentation

## 2022-03-25 DIAGNOSIS — Z803 Family history of malignant neoplasm of breast: Secondary | ICD-10-CM | POA: Insufficient documentation

## 2022-03-25 DIAGNOSIS — Z800 Family history of malignant neoplasm of digestive organs: Secondary | ICD-10-CM | POA: Diagnosis not present

## 2022-03-25 DIAGNOSIS — Z9049 Acquired absence of other specified parts of digestive tract: Secondary | ICD-10-CM | POA: Diagnosis not present

## 2022-03-25 DIAGNOSIS — Z79811 Long term (current) use of aromatase inhibitors: Secondary | ICD-10-CM | POA: Diagnosis not present

## 2022-03-25 DIAGNOSIS — M8580 Other specified disorders of bone density and structure, unspecified site: Secondary | ICD-10-CM | POA: Insufficient documentation

## 2022-03-25 DIAGNOSIS — Z809 Family history of malignant neoplasm, unspecified: Secondary | ICD-10-CM | POA: Diagnosis not present

## 2022-03-25 DIAGNOSIS — Z801 Family history of malignant neoplasm of trachea, bronchus and lung: Secondary | ICD-10-CM | POA: Insufficient documentation

## 2022-03-25 LAB — CBC WITH DIFFERENTIAL/PLATELET
Abs Immature Granulocytes: 0.03 10*3/uL (ref 0.00–0.07)
Basophils Absolute: 0 10*3/uL (ref 0.0–0.1)
Basophils Relative: 1 %
Eosinophils Absolute: 0.2 10*3/uL (ref 0.0–0.5)
Eosinophils Relative: 3 %
HCT: 40 % (ref 36.0–46.0)
Hemoglobin: 13.1 g/dL (ref 12.0–15.0)
Immature Granulocytes: 1 %
Lymphocytes Relative: 20 %
Lymphs Abs: 1.1 10*3/uL (ref 0.7–4.0)
MCH: 27.5 pg (ref 26.0–34.0)
MCHC: 32.8 g/dL (ref 30.0–36.0)
MCV: 84 fL (ref 80.0–100.0)
Monocytes Absolute: 0.4 10*3/uL (ref 0.1–1.0)
Monocytes Relative: 7 %
Neutro Abs: 3.9 10*3/uL (ref 1.7–7.7)
Neutrophils Relative %: 68 %
Platelets: 174 10*3/uL (ref 150–400)
RBC: 4.76 MIL/uL (ref 3.87–5.11)
RDW: 13.7 % (ref 11.5–15.5)
WBC: 5.6 10*3/uL (ref 4.0–10.5)
nRBC: 0 % (ref 0.0–0.2)

## 2022-03-25 LAB — COMPREHENSIVE METABOLIC PANEL
ALT: 12 U/L (ref 0–44)
AST: 11 U/L — ABNORMAL LOW (ref 15–41)
Albumin: 4.1 g/dL (ref 3.5–5.0)
Alkaline Phosphatase: 78 U/L (ref 38–126)
Anion gap: 8 (ref 5–15)
BUN: 24 mg/dL — ABNORMAL HIGH (ref 8–23)
CO2: 27 mmol/L (ref 22–32)
Calcium: 9.4 mg/dL (ref 8.9–10.3)
Chloride: 106 mmol/L (ref 98–111)
Creatinine, Ser: 1.07 mg/dL — ABNORMAL HIGH (ref 0.44–1.00)
GFR, Estimated: 55 mL/min — ABNORMAL LOW (ref >60)
Glucose, Bld: 104 mg/dL — ABNORMAL HIGH (ref 70–99)
Potassium: 4.4 mmol/L (ref 3.5–5.1)
Sodium: 141 mmol/L (ref 135–145)
Total Bilirubin: 1.1 mg/dL (ref 0.3–1.2)
Total Protein: 7 g/dL (ref 6.5–8.1)

## 2022-03-25 MED ORDER — SODIUM CHLORIDE 0.9 % IV SOLN
Freq: Once | INTRAVENOUS | Status: AC
  Filled 2022-03-25: qty 250

## 2022-03-25 MED ORDER — ZOLEDRONIC ACID 4 MG/100ML IV SOLN
4.0000 mg | Freq: Once | INTRAVENOUS | Status: AC
  Administered 2022-03-25: 4 mg via INTRAVENOUS
  Filled 2022-03-25: qty 100

## 2022-03-25 NOTE — Assessment & Plan Note (Addendum)
#  Stage IA right breast cancer, s/p lumpectomy/SLNB and radiation. Labs reviewed and discussed with patient. Continue Arimidex 1 mg daily. Plan total 5 years of endocrine therapy-till January 2027 Continue annual mammogram - she gets via Dr.Cintron's office.

## 2022-03-25 NOTE — Progress Notes (Signed)
Hematology/Oncology Progress note [phone] Fax:([phone]) [phone]     CHIEF COMPLAINTS/REASON FOR VISIT:  Follow up for breast cancer  ASSESSMENT & PLAN:   Malignant neoplasm of upper-inner quadrant of right breast in female, estrogen receptor positive ([place]) #Stage IA right breast cancer, s/p lumpectomy/SLNB and radiation. Labs reviewed and discussed with patient. Continue Arimidex 1 mg daily. Plan total 5 years of endocrine therapy-till January 2027 Continue annual mammogram - Dr.Cintron's office.   Aromatase inhibitor use 12/27/2019 DEXA showed osteopenia 10-year major osteoporotic fracture right is 9.2%.  -Repeat bone density in November 2023. Continue calcium and vitamin D supplementation Proceed with Zometa today.  Orders Placed This Encounter  Procedures   CBC with Differential ([place] Only)    Standing Status:   Future    Standing Expiration Date:   03/26/2023   CMP ([place] only)    Standing Status:   Future    Standing Expiration Date:   03/26/2023    All questions were answered. The patient knows to call the clinic with any problems, questions or concerns.  Earlie Server, MD, PhD [facility] Hematology Oncology 03/25/2022   HISTORY OF PRESENTING ILLNESS:   Barbara [name] is a  72 y.o.  female presents for follow up of breast cancer  Oncology History  Malignant neoplasm of upper-inner quadrant of right breast in female, estrogen receptor positive ([place])  10/13/2019 Mammogram   Bilateral Diagnostic mammogram  Showed 42m spiculated mass in the right breast 1:00. Unchanged left axillary probabaly benign mass- 1 year stability. 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 ER 90% positive, PR 1-0% positive, HER2 negative.    Family history of breast cancer: PEthlyn Daniels and PFraser Dinfirst cousin Family history of other cancers: lung cancer in mother, pancreatic cancer in father, unknown cancer in pat uncle.  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 VUS in BRCA2 called c.2980G>A identified on the [facility] Multi-Cancer Panel. The report date is 11/17/2019.  The Multi-Cancer Panel offered by [facility] includes sequencing and/or deletion duplication testing of the following 85 genes: AIP, ALK, APC, ATM, AXIN2,BAP1,  BARD1, BLM, BMPR1A, BRCA1, BRCA2, BRIP1, CASR, CDC73, CDH1, CDK4, CDKN1B, CDKN1C, CDKN2A (p14ARF), CDKN2A (p16INK4a), CEBPA, CHEK2, CTNNA1, DICER1, DIS3L2, EGFR (c.2369C>T, p.Thr790Met variant only), EPCAM (Deletion/duplication testing only), FH, FLCN, GATA2, GPC3, GREM1 (Promoter region deletion/duplication testing only), HOXB13 (c.251G>A, p.Gly84Glu), HRAS, KIT, MAX, MEN1, MET, MITF (c.952G>A, p.Glu318Lys variant only), MLH1, MSH2, MSH3, MSH6, MUTYH, NBN, NF1, NF2, NTHL1, PALB2, PDGFRA, PHOX2B, PMS2, POLD1, POLE, POT1, PRKAR1A, PTCH1, PTEN, RAD50, RAD51C, RAD51D, RB1, RECQL4, RET, RNF43, RUNX1, SDHAF2, SDHA (sequence changes only), SDHB, SDHC, SDHD, SMAD4, SMARCA4,  SMARCB1, SMARCE1, STK11, SUFU, TERC, TERT, TMEM127, TP53, TSC1, TSC2, VHL, WRN and  WT1.    10/30/2019 Surgery   patient underwent right lumpectomy and sentinel lymph node biopsy.  Stage IA right ER 90%/PR 1-10% positive, HER-2 negative right breast invasive mammary carcinoma, pT1a pN0 tubular carcinoma, grade 1.    01/18/2020 -  Radiation Therapy   finished adjuvant Breast radiation.    02/2020 -  Anti-estrogen oral therapy   started on Arimidex    10/30/2020 Mammogram   bilateral diagnostic mammogram showed a subtle left breast architectural distortion without suspicious sonographic correlate.   -11/06/2020, left breast upper inner stereotactic biopsy showed benign breast tissue with fibrocystic changes with ductal ectasia. Psudoangiomatous stromal hyperplasia. usual ductal hyperplasia. Negative for atypia and malignancy. Left breast upper inner posterior stereotactic biopsy showed benign breast tissue with sclerosing adenosis. Fibrocystic changes. Negative for atypia and malignancy     10/31/2021 Mammogram   Bilateral diagnostic mammogram  1. There is a 9 mm group of indeterminate amorphous calcifications in the LEFT upper outer breast at posterior depth. Recommend stereotactic guided biopsy for definitive characterization. 2. No mammographic evidence of malignancy in the RIGHT breast.  11/12/21 Left upper outer breast biopsy showed benign fibrofatty breast parenchyma with fibrocystic and fibroadenomatoid changes, and associated coarse dystrophic calcifications. Negative for atypical proliferative breast disease    12/29/2021 Imaging   DEXA showed osteopenia FRAX major osteoporotic fracture is 15.8% within the next ten years.      INTERVAL HISTORY Barbara [name] is a 72 y.o. female who has above history reviewed by me today presents for follow up visit for management of right breast cancer. Patient reports feeling well.  She has no new complaints. Patient tolerates Arimidex with manageable side effects.  She is on Zometa every 6 months. She denies any new breast  concerns.   Review of Systems  Constitutional:  Negative for appetite change, chills, fatigue and fever.  HENT:   Negative for hearing loss and voice change.   Eyes:  Negative for eye problems.  Respiratory:  Negative for chest tightness and cough.   Cardiovascular:  Negative for chest pain.  Gastrointestinal:  Negative for abdominal distention, abdominal pain and blood in stool.  Endocrine: Negative for hot flashes.  Genitourinary:  Negative for difficulty urinating and frequency.   Musculoskeletal:  Negative for arthralgias.  Skin:  Negative for itching and rash.  Neurological:  Negative for extremity weakness.  Hematological:  Negative for adenopathy.  Psychiatric/Behavioral:  Negative for confusion.     MEDICAL HISTORY:  Past Medical History:  Diagnosis Date   Actinic keratosis    Cancer ([place])    Family history of breast cancer    Family history of lung cancer    Family history of pancreatic cancer    Family history of uterine cancer    GERD (gastroesophageal reflux disease)    Headache    migraines   Hypertension    Melanoma ([place]) 08/08/2020   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, R lat mid back, exc 09/18/20   Osteopenia 02/15/2020   Personal history of radiation therapy    Pre-diabetes    Sleep apnea    uses cpap    SURGICAL HISTORY: Past Surgical History:  Procedure Laterality Date   APPENDECTOMY     BREAST BIOPSY Right 10/20/2019   Affirm bx-"X" clip positive   BREAST BIOPSY Left 11/06/2020   stereo bx, x clip, benign   BREAST BIOPSY Left 11/06/2020   Stereo bx-Distortion #2-"Coil" clip-benign  BREAST BIOPSY Left 11/12/2021   stereo bx/ ribbon clip/ path pending   BREAST CYST EXCISION Right    BREAST CYST EXCISION Right    BREAST CYST EXCISION Left    BREAST LUMPECTOMY     BREAST SURGERY     CHOLECYSTECTOMY     PART MASTECTOMY,RADIO FREQUENCY LOCALIZER,AXILLARY SENTINEL NODE BIOPSY Right 10/30/2019   Procedure: PART MASTECTOMY,RADIO FREQUENCY  LOCALIZER,AXILLARY SENTINEL NODE BIOPSY;  Surgeon: Herbert Pun, MD;  Location: [facility] ORS;  Service: General;  Laterality: Right;    SOCIAL HISTORY: Social History   Socioeconomic History   Marital status: Married    Spouse name: Not on file   Number of children: Not on file   Years of education: Not on file   Highest education level: Not on file  Occupational History   Not on file  Tobacco Use   Smoking status: Never   Smokeless tobacco: Never  Vaping Use   Vaping Use: Never used  Substance and Sexual Activity   Alcohol use: No   Drug use: No   Sexual activity: Not on file  Other Topics Concern   Not on file  Social History Narrative   Not on file   Social Determinants of Health   Financial Resource Strain: Not on file  Food Insecurity: Not on file  Transportation Needs: Not on file  Physical Activity: Not on file  Stress: Not on file  Social Connections: Not on file  Intimate Partner Violence: Not on file    FAMILY HISTORY: Family History  Problem Relation Age of Onset   Breast cancer Paternal Aunt    Lung cancer Mother    Pancreatic cancer Father    Cancer Paternal Uncle        unk type   Cancer Paternal Aunt        unk type, possibly breast   Breast cancer Cousin    Cancer Cousin        unk type    ALLERGIES:  has No Known Allergies.  MEDICATIONS:  Current Outpatient Medications  Medication Sig Dispense Refill   amLODipine (NORVASC) 5 MG tablet Take 5 mg by mouth at bedtime.      anastrozole (ARIMIDEX) 1 MG tablet Take 1 tablet (1 mg total) by mouth daily. 90 tablet 3   aspirin 81 MG EC tablet Take 81 mg by mouth daily.      azelastine (ASTELIN) 0.1 % nasal spray Place into the nose.     calcipotriene-betamethasone (TACLONEX) external suspension Apply to affected area ears at night until improved. 60 g 1   Calcium Carbonate-Vit D-Min (CALCIUM 1200 PO) Take by mouth.     cetirizine (ZYRTEC) 10 MG tablet Take 10 mg by mouth every morning.       Cholecalciferol (VITAMIN D) 50 MCG (2000 UT) CAPS Take 2,000 Units by mouth daily.     CINNAMON PO Take 1,000 mg by mouth in the morning and at bedtime.      clobetasol cream (TEMOVATE) [id number] % Apply 1 application topically 2 (two) times daily. 45 g 1   fluconazole (DIFLUCAN) 200 MG tablet Take 1 tablet (200 mg total) by mouth daily. 30 tablet 0   Krill Oil 500 MG CAPS Take 500 mg by mouth daily.     lisinopril (ZESTRIL) 2.5 MG tablet Take 2.5 mg by mouth daily.     lovastatin (MEVACOR) 40 MG tablet Take 40 mg by mouth at bedtime.      meloxicam (MOBIC) 15 MG tablet Take 15  mg by mouth daily as needed for pain.     mometasone (ELOCON) 0.1 % cream Apply 1 application  topically daily as needed (ear irritation).     pantoprazole (PROTONIX) 40 MG tablet Take 40 mg by mouth every morning.      benzonatate (TESSALON) 100 MG capsule Take 200 mg by mouth every 8 (eight) hours as needed. (Patient not taking: Reported on 03/25/2022)     No current facility-administered medications for this visit.     PHYSICAL EXAMINATION: ECOG PERFORMANCE STATUS: 0 - Asymptomatic Vitals:   03/25/22 1317  BP: 111/75  Pulse: 72  Resp: 18  Temp: (!) 97.1 F (36.2 C)   Filed Weights   03/25/22 1317  Weight: 187 lb 1.6 oz (84.9 kg)    Physical Exam Constitutional:      General: She is not in acute distress. HENT:     Head: Normocephalic and atraumatic.  Eyes:     General: No scleral icterus. Cardiovascular:     Rate and Rhythm: Normal rate and regular rhythm.     Heart sounds: Normal heart sounds.  Pulmonary:     Effort: Pulmonary effort is normal. No respiratory distress.     Breath sounds: No wheezing.  Abdominal:     General: Bowel sounds are normal. There is no distension.     Palpations: Abdomen is soft.  Musculoskeletal:        General: No deformity. Normal range of motion.     Cervical back: Normal range of motion and neck supple.  Skin:    General: Skin is warm and dry.     Findings: No  erythema or rash.  Neurological:     Mental Status: She is alert and oriented to person, place, and time. Mental status is at baseline.     Cranial Nerves: No cranial nerve deficit.     Coordination: Coordination normal.  Psychiatric:        Mood and Affect: Mood normal.    Breast exam was performed in seated and lying down position. Patient is status post right breast lumpectomy with scarring tissue. No palpable breast mass in both breasts.  No palatable axillary lymphadenopathy bilaterally.    LABORATORY DATA:  I have reviewed the data as listed     Latest Ref Rng & Units 03/25/2022    1:03 PM 09/22/2021   12:45 PM 03/21/2021    9:19 AM  CBC  WBC 4.0 - 10.5 K/uL 5.6  6.3  5.6   Hemoglobin 12.0 - 15.0 g/dL 13.1  14.0  13.9   Hematocrit 36.0 - 46.0 % 40.0  42.6  41.6   Platelets 150 - 400 K/uL 174  182  166       Latest Ref Rng & Units 03/25/2022    1:03 PM 09/22/2021   12:45 PM 03/21/2021    9:19 AM  CMP  Glucose 70 - 99 mg/dL 104  108  131   BUN 8 - 23 mg/dL 24  19  18   $ Creatinine 0.44 - 1.00 mg/dL 1.07  0.92  0.89   Sodium 135 - 145 mmol/L 141  139  137   Potassium 3.5 - 5.1 mmol/L 4.4  4.0  3.9   Chloride 98 - 111 mmol/L 106  105  103   CO2 22 - 32 mmol/L 27  27  26   $ Calcium 8.9 - 10.3 mg/dL 9.4  9.9  9.5   Total Protein 6.5 - 8.1 g/dL 7.0  7.3  6.5  Total Bilirubin 0.3 - 1.2 mg/dL 1.1  1.9  1.4   Alkaline Phos 38 - 126 U/L 78  60  63   AST 15 - 41 U/L 11  13  15   $ ALT 0 - 44 U/L 12  18  18    $ RADIOGRAPHIC STUDIES: I have personally reviewed the radiological images as listed and agreed with the findings in the report. No results found.

## 2022-03-25 NOTE — Assessment & Plan Note (Addendum)
12/29/21 DEXA showed osteopenia 10-year major osteoporotic fracture right is 15 %. Continue calcium and vitamin D supplementation Proceed with Zometa today.

## 2022-05-18 ENCOUNTER — Ambulatory Visit: Payer: Medicare PPO | Admitting: Dermatology

## 2022-05-18 VITALS — BP 126/73

## 2022-05-18 DIAGNOSIS — Z1283 Encounter for screening for malignant neoplasm of skin: Secondary | ICD-10-CM | POA: Diagnosis not present

## 2022-05-18 DIAGNOSIS — L729 Follicular cyst of the skin and subcutaneous tissue, unspecified: Secondary | ICD-10-CM

## 2022-05-18 DIAGNOSIS — L578 Other skin changes due to chronic exposure to nonionizing radiation: Secondary | ICD-10-CM

## 2022-05-18 DIAGNOSIS — L821 Other seborrheic keratosis: Secondary | ICD-10-CM

## 2022-05-18 DIAGNOSIS — D2271 Melanocytic nevi of right lower limb, including hip: Secondary | ICD-10-CM | POA: Diagnosis not present

## 2022-05-18 DIAGNOSIS — L814 Other melanin hyperpigmentation: Secondary | ICD-10-CM

## 2022-05-18 DIAGNOSIS — D225 Melanocytic nevi of trunk: Secondary | ICD-10-CM | POA: Diagnosis not present

## 2022-05-18 DIAGNOSIS — Z86006 Personal history of melanoma in-situ: Secondary | ICD-10-CM | POA: Diagnosis not present

## 2022-05-18 DIAGNOSIS — Z872 Personal history of diseases of the skin and subcutaneous tissue: Secondary | ICD-10-CM

## 2022-05-18 DIAGNOSIS — L219 Seborrheic dermatitis, unspecified: Secondary | ICD-10-CM | POA: Diagnosis not present

## 2022-05-18 DIAGNOSIS — I8393 Asymptomatic varicose veins of bilateral lower extremities: Secondary | ICD-10-CM

## 2022-05-18 DIAGNOSIS — D2262 Melanocytic nevi of left upper limb, including shoulder: Secondary | ICD-10-CM

## 2022-05-18 DIAGNOSIS — B351 Tinea unguium: Secondary | ICD-10-CM

## 2022-05-18 DIAGNOSIS — D229 Melanocytic nevi, unspecified: Secondary | ICD-10-CM

## 2022-05-18 DIAGNOSIS — L7211 Pilar cyst: Secondary | ICD-10-CM

## 2022-05-18 MED ORDER — MOMETASONE FUROATE 0.1 % EX CREA: 1.0000 | TOPICAL_CREAM | Freq: Every day | CUTANEOUS | 3 refills | Status: DC | PRN

## 2022-05-18 MED ORDER — FLUCONAZOLE 200 MG PO TABS: 200.0000 mg | ORAL_TABLET | Freq: Every day | ORAL | 0 refills | Status: DC

## 2022-05-18 NOTE — Patient Instructions (Signed)
Due to recent changes in healthcare laws, you may see results of your pathology and/or laboratory studies on MyChart before the doctors have had a chance to review them. We understand that in some cases there may be results that are confusing or concerning to you. Please understand that not all results are received at the same time and often the doctors may need to interpret multiple results in order to provide you with the best plan of care or course of treatment. Therefore, we ask that you please give us 2 business days to thoroughly review all your results before contacting the office for clarification. Should we see a critical lab result, you will be contacted sooner.   If You Need Anything After Your Visit  If you have any questions or concerns for your doctor, please call our main line at [phone] and press option 4 to reach your doctor's medical assistant. If no one answers, please leave a voicemail as directed and we will return your call as soon as possible. Messages left after 4 pm will be answered the following business day.   You may also send us a message via MyChart. We typically respond to MyChart messages within 1-2 business days.  For prescription refills, please ask your pharmacy to contact our office. Our fax number is [phone].  If you have an urgent issue when the clinic is closed that cannot wait until the next business day, you can page your doctor at the number below.    Please note that while we do our best to be available for urgent issues outside of office hours, we are not available 24/7.   If you have an urgent issue and are unable to reach us, you may choose to seek medical care at your doctor's office, retail clinic, urgent care center, or emergency room.  If you have a medical emergency, please immediately call 911 or go to the emergency department.  Pager Numbers  - Dr. Kowalski: [phone]  - Dr. Moye: [phone]  - Dr. Stewart:  [phone]  In the event of inclement weather, please call our main line at [phone] for an update on the status of any delays or closures.  Dermatology Medication Tips: Please keep the boxes that topical medications come in in order to help keep track of the instructions about where and how to use these. Pharmacies typically print the medication instructions only on the boxes and not directly on the medication tubes.   If your medication is too expensive, please contact our office at [phone] option 4 or send us a message through MyChart.   We are unable to tell what your co-pay for medications will be in advance as this is different depending on your insurance coverage. However, we may be able to find a substitute medication at lower cost or fill out paperwork to get insurance to cover a needed medication.   If a prior authorization is required to get your medication covered by your insurance company, please allow us 1-2 business days to complete this process.  Drug prices often vary depending on where the prescription is filled and some pharmacies may offer cheaper prices.  The website www.goodrx.com contains coupons for medications through different pharmacies. The prices here do not account for what the cost may be with help from insurance (it may be cheaper with your insurance), but the website can give you the price if you did not use any insurance.  - You can print the associated coupon and take it with   your prescription to the pharmacy.  - You may also stop by our office during regular business hours and pick up a GoodRx coupon card.  - If you need your prescription sent electronically to a different pharmacy, notify our office through [facility] MyChart or by phone at [phone] option 4.     Si Usted Necesita Algo Despus de Su Visita  Tambin puede enviarnos un mensaje a travs de MyChart. Por lo general respondemos a los mensajes de MyChart en el transcurso de 1 a 2  das hbiles.  Para renovar recetas, por favor pida a su farmacia que se ponga en contacto con nuestra oficina. Nuestro nmero de fax es el [phone].  Si tiene un asunto urgente cuando la clnica est cerrada y que no puede esperar hasta el siguiente da hbil, puede llamar/localizar a su doctor(a) al nmero que aparece a continuacin.   Por favor, tenga en cuenta que aunque hacemos todo lo posible para estar disponibles para asuntos urgentes fuera del horario de oficina, no estamos disponibles las 24 horas del da, los 7 das de la semana.   Si tiene un problema urgente y no puede comunicarse con nosotros, puede optar por buscar atencin mdica  en el consultorio de su doctor(a), en una clnica privada, en un centro de atencin urgente o en una sala de emergencias.  Si tiene una emergencia mdica, por favor llame inmediatamente al 911 o vaya a la sala de emergencias.  Nmeros de bper  - Dr. Kowalski: [phone]  - Dra. Moye: [phone]  - Dra. Stewart: [phone]  En caso de inclemencias del tiempo, por favor llame a nuestra lnea principal al [phone] para una actualizacin sobre el estado de cualquier retraso o cierre.  Consejos para la medicacin en dermatologa: Por favor, guarde las cajas en las que vienen los medicamentos de uso tpico para ayudarle a seguir las instrucciones sobre dnde y cmo usarlos. Las farmacias generalmente imprimen las instrucciones del medicamento slo en las cajas y no directamente en los tubos del medicamento.   Si su medicamento es muy caro, por favor, pngase en contacto con nuestra oficina llamando al [phone] y presione la opcin 4 o envenos un mensaje a travs de MyChart.   No podemos decirle cul ser su copago por los medicamentos por adelantado ya que esto es diferente dependiendo de la cobertura de su seguro. Sin embargo, es posible que podamos encontrar un medicamento sustituto a menor costo o llenar un formulario para que el  seguro cubra el medicamento que se considera necesario.   Si se requiere una autorizacin previa para que su compaa de seguros cubra su medicamento, por favor permtanos de 1 a 2 das hbiles para completar este proceso.  Los precios de los medicamentos varan con frecuencia dependiendo del lugar de dnde se surte la receta y alguna farmacias pueden ofrecer precios ms baratos.  El sitio web www.goodrx.com tiene cupones para medicamentos de diferentes farmacias. Los precios aqu no tienen en cuenta lo que podra costar con la ayuda del seguro (puede ser ms barato con su seguro), pero el sitio web puede darle el precio si no utiliz ningn seguro.  - Puede imprimir el cupn correspondiente y llevarlo con su receta a la farmacia.  - Tambin puede pasar por nuestra oficina durante el horario de atencin regular y recoger una tarjeta de cupones de GoodRx.  - Si necesita que su receta se enve electrnicamente a una farmacia diferente, informe a nuestra oficina a travs de MyChart de [place]   o por telfono llamando al [phone] y presione la opcin 4.  

## 2022-05-18 NOTE — Progress Notes (Signed)
Follow-Up Visit   Subjective  Barbara [name] is a 72 y.o. female who presents for the following: Skin Cancer Screening and Full Body Skin Exam, hx of Melanoma IS, Aks, Onychomycosis toenails, on Fluconazole 200mg  1 po q wk, doing better.  No side effects.  The patient presents for Total-Body Skin Exam (TBSE) for skin cancer screening and mole check. The patient has spots, moles and lesions to be evaluated, some may be new or changing and the patient has concerns that these could be cancer.    The following portions of the chart were reviewed this encounter and updated as appropriate: medications, allergies, medical history  Review of Systems:  No other skin or systemic complaints except as noted in HPI or Assessment and Plan.  Objective  Well appearing patient in no apparent distress; mood and affect are within normal limits.  A full examination was performed including scalp, head, eyes, ears, nose, lips, neck, chest, axillae, abdomen, back, buttocks, bilateral upper extremities, bilateral lower extremities, hands, feet, fingers, toes, fingernails, and toenails. All findings within normal limits unless otherwise noted below.   Relevant physical exam findings are noted in the Assessment and Plan.    Assessment & Plan   LENTIGINES, SEBORRHEIC KERATOSES, HEMANGIOMAS - Benign normal skin lesions - Benign-appearing - Call for any changes  MELANOCYTIC NEVI - Tan-brown and/or pink-flesh-colored symmetric macules and papules - Benign appearing on exam today - Observation - Call clinic for new or changing moles - Recommend daily use of broad spectrum spf 30+ sunscreen to sun-exposed areas.   ACTINIC DAMAGE - Chronic condition, secondary to cumulative UV/sun exposure - diffuse scaly erythematous macules with underlying dyspigmentation - Recommend daily broad spectrum sunscreen SPF 30+ to sun-exposed areas, reapply every 2 hours as needed.  - Staying in the shade or wearing long  sleeves, sun glasses (UVA+UVB protection) and wide brim hats (4-inch brim around the entire circumference of the hat) are also recommended for sun protection.  - Call for new or changing lesions.  SKIN CANCER SCREENING PERFORMED TODAY.  HISTORY OF MELANOMA IN SITU - No evidence of recurrence today - Recommend regular full body skin exams - Recommend daily broad spectrum sunscreen SPF 30+ to sun-exposed areas, reapply every 2 hours as needed.  - Call if any new or changing lesions are noted between office visits  - R lat mid back excised 09/18/20  NEVI Exam:  L post upper arm - 4.58mm 2 tone brown pap with emerging hair R med pretibia - 3.64mm brown macule R mid back - 2.62mm med brown macule  Treatment Plan: Benign-appearing. Stable compared to previous visit. Observation.  Call clinic for new or changing moles.  Recommend daily use of broad spectrum spf 30+ sunscreen to sun-exposed areas.    SEBORRHEIC DERMATITIS Exam: mild erythema and scale of the ear concha  Chronic and persistent condition with duration or expected duration over one year. Condition is symptomatic/ bothersome to patient. Not currently at goal.   Seborrheic Dermatitis is a chronic persistent rash characterized by pinkness and scaling most commonly of the mid face but also can occur on the scalp (dandruff), ears; mid chest, mid back and groin.  It tends to be exacerbated by stress and cooler weather.  People who have neurologic disease may experience new onset or exacerbation of existing seborrheic dermatitis.  The condition is not curable but treatable and can be controlled.  Treatment Plan: Cont Mometasone cr qd prn flares    Topical steroids (such as triamcinolone,  fluocinolone, fluocinonide, mometasone, clobetasol, halobetasol, betamethasone, hydrocortisone) can cause thinning and lightening of the skin if they are used for too long in the same area. Your physician has selected the right strength medicine for your  problem and area affected on the body. Please use your medication only as directed by your physician to prevent side effects.     ONYCHOMYCOSIS Exam: distal yellow white disco with thickening R great toe, R 2nd and R 3rd toenails, improved when compared to baseline photos LFTs from 03/25/22 WNL  Chronic and persistent condition with duration or expected duration over one year. Condition is symptomatic/ bothersome to patient. Improving but not currently at goal.  Treatment Plan: Cont Fluconazole 200mg  1 po wk #15 0rf(pt currently has been on Fluconazole 200mg  qwk ~7.5 months)   Side effects of fluconazole (diflucan) include nausea, diarrhea, headache, dizziness, taste changes, rare risk of irritation of the liver, allergy, or decreased blood counts (which could show up as infection or tiredness).   Spider Veins - Dilated blue, purple or red veins at the lower extremities - Reassured - Smaller vessels can be treated by sclerotherapy (a procedure to inject a medicine into the veins to make them disappear) if desired, but the treatment is not covered by insurance. Larger vessels may be covered if symptomatic and we would refer to vascular surgeon if treatment desired.   SEBORRHEIC KERATOSIS - Stuck-on, waxy, tan-brown papules and/or plaques - including R inframammary - Benign-appearing - Discussed benign etiology and prognosis. - Observe - Call for any changes   PILAR CYST Exam: Subcutaneous nodule at R post parietal scalp 1.3cm  Benign-appearing. Exam most consistent with an epidermal inclusion cyst. Discussed that a cyst is a benign growth that can grow over time and sometimes get irritated or inflamed. Recommend observation if it is not bothersome. Discussed option of surgical excision to remove it if it is growing, symptomatic, or other changes noted. Please call for new or changing lesions so they can be evaluated.  HISTORY OF PRECANCEROUS ACTINIC KERATOSIS - site(s) of PreCancerous  Actinic Keratosis clear today. - these may recur and new lesions may form requiring treatment to prevent transformation into skin cancer - observe for new or changing spots and contact [facility] for appointment if occur - photoprotection with sun protective clothing; sunglasses and broad spectrum sunscreen with SPF of at least 30 + and frequent self skin exams recommended - yearly exams by a dermatologist recommended for persons with history of PreCancerous Actinic Keratoses   Return in about 6 months (around 11/17/2022) for TBSE, Hx of Melanoma IS , Hx of AKs.  I, Ardis Rowan, RMA, am acting as scribe for Willeen Niece, MD .   Documentation: I have reviewed the above documentation for accuracy and completeness, and I agree with the above.  Willeen Niece, MD

## 2022-05-21 DIAGNOSIS — H35363 Drusen (degenerative) of macula, bilateral: Secondary | ICD-10-CM | POA: Diagnosis not present

## 2022-05-21 DIAGNOSIS — H2512 Age-related nuclear cataract, left eye: Secondary | ICD-10-CM | POA: Diagnosis not present

## 2022-05-21 DIAGNOSIS — H2511 Age-related nuclear cataract, right eye: Secondary | ICD-10-CM | POA: Diagnosis not present

## 2022-05-21 DIAGNOSIS — H43813 Vitreous degeneration, bilateral: Secondary | ICD-10-CM | POA: Diagnosis not present

## 2022-05-21 DIAGNOSIS — Z0100 Encounter for examination of eyes and vision without abnormal findings: Secondary | ICD-10-CM | POA: Diagnosis not present

## 2022-05-21 DIAGNOSIS — H2513 Age-related nuclear cataract, bilateral: Secondary | ICD-10-CM | POA: Diagnosis not present

## 2022-05-25 IMAGING — MG MM BREAST BX W/ LOC DEV 1ST LESION IMAGE BX SPEC STEREO GUIDE*R*
8 of 9 series · 8 of 17 positions shown · non-contrast
Comparison: Previous exams.
COMPARISON: Previous exams.

Addendum:
CLINICAL DATA: Patient with possible subtle distortion medial right
breast.

EXAM:
RIGHT BREAST STEREOTACTIC CORE NEEDLE BIOPSY

[R (1 of 6)]
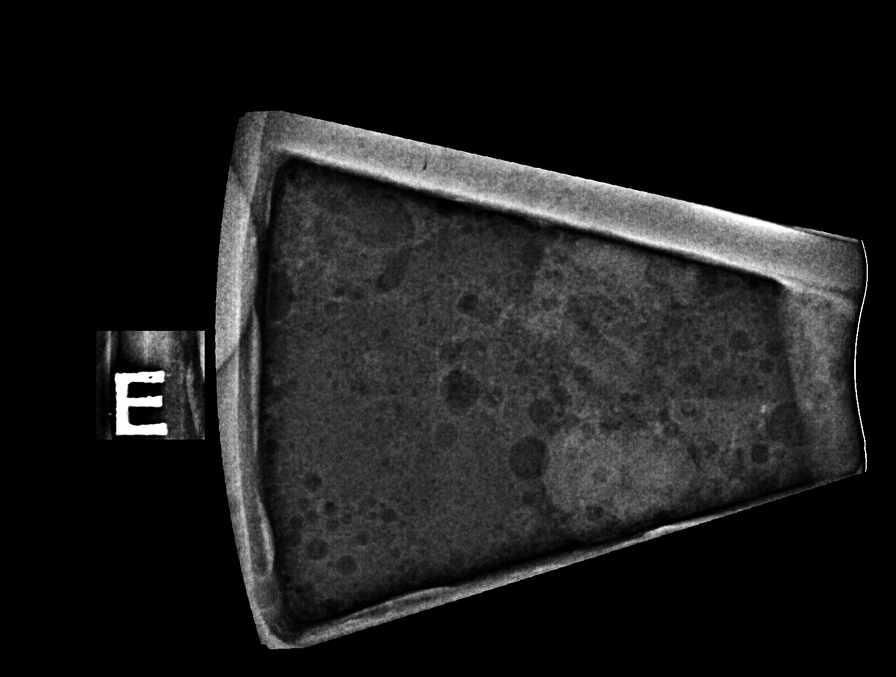

[R (2 of 6)]
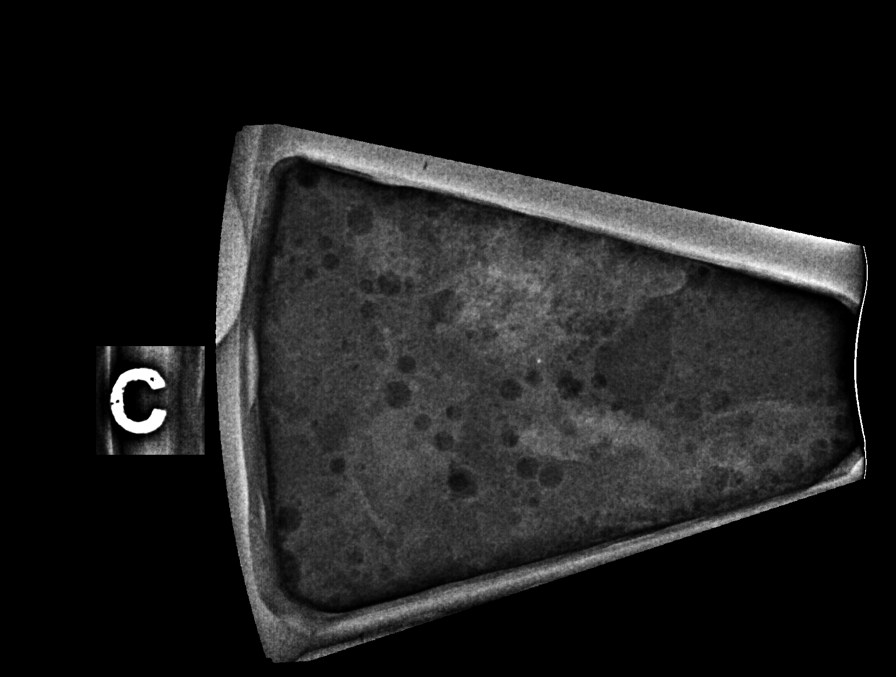

[R (3 of 6)]
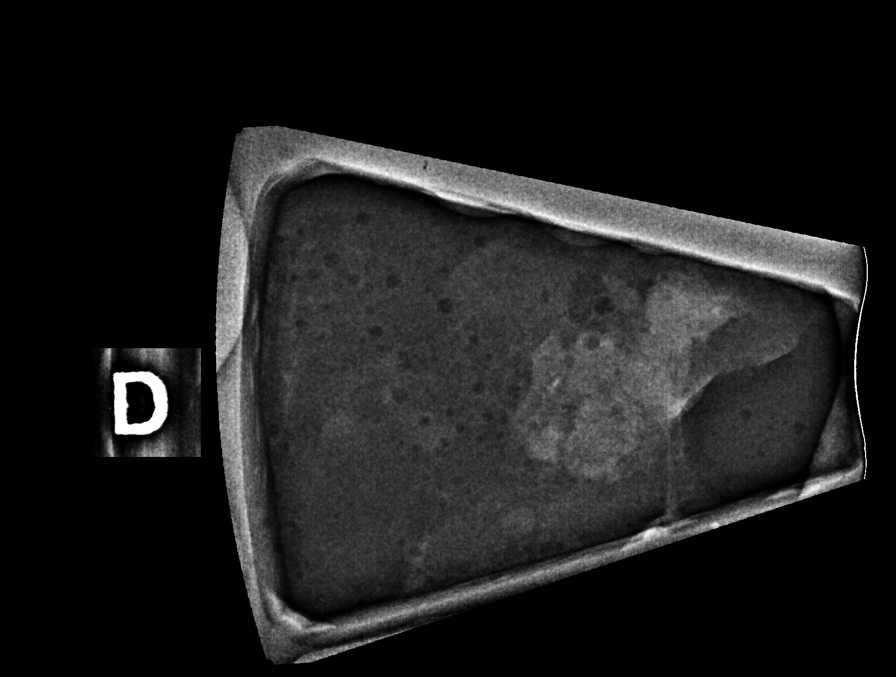

[R (4 of 6)]
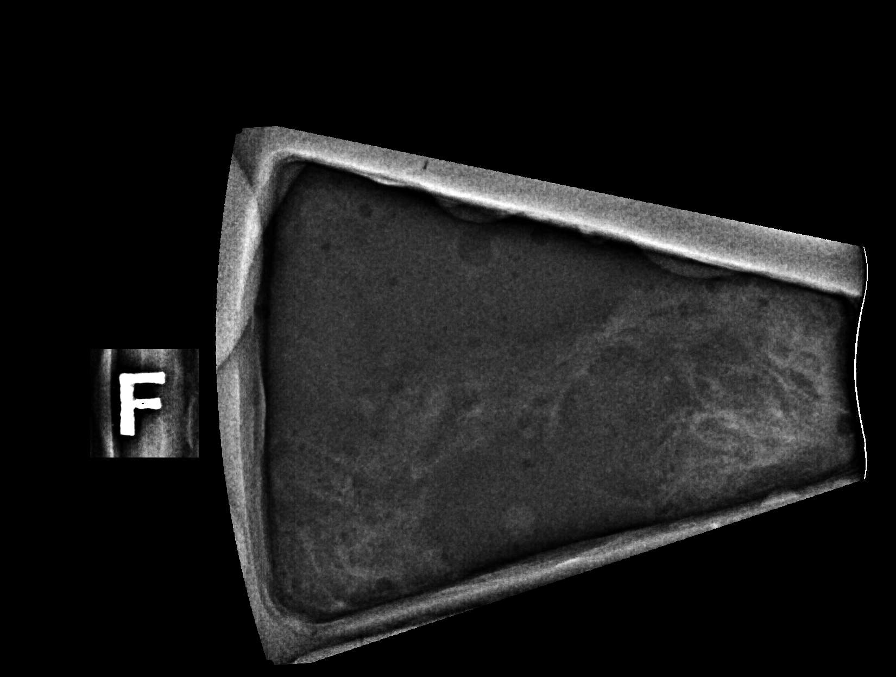

[R (5 of 6)]
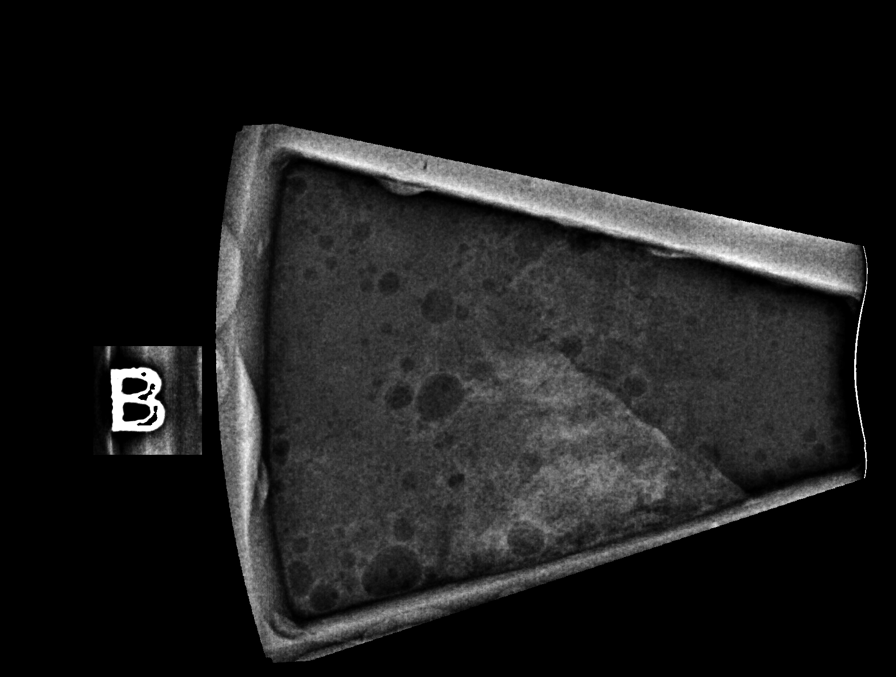

[R (6 of 6)]
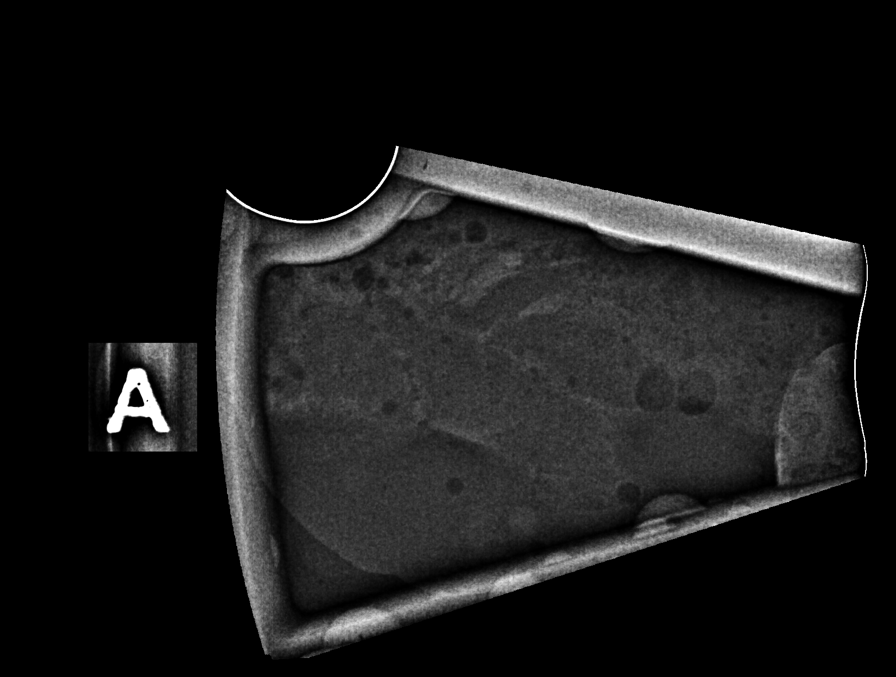

[R CC]
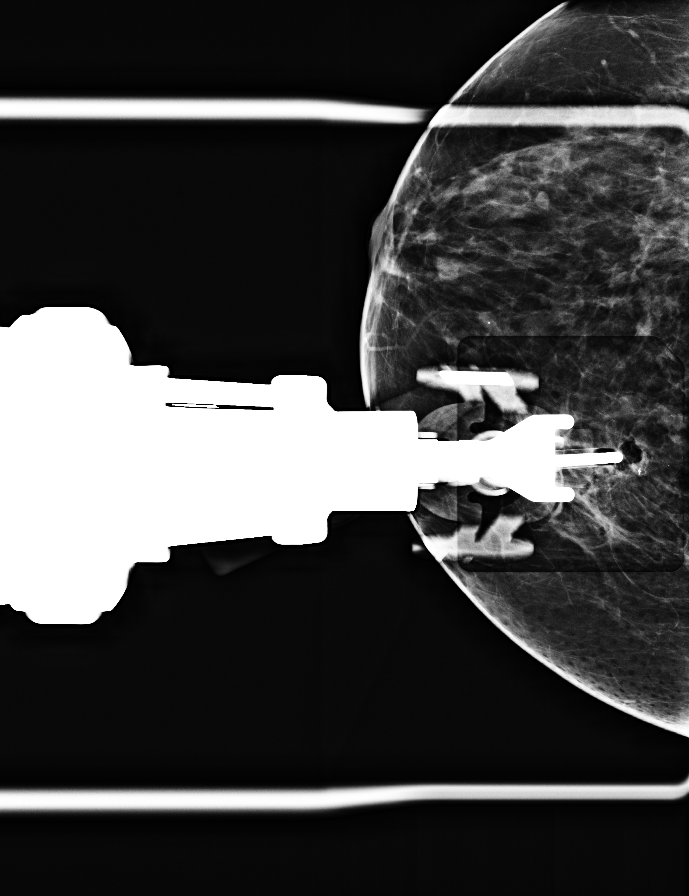

[R CC tomo · tomo slice 33/64.0]
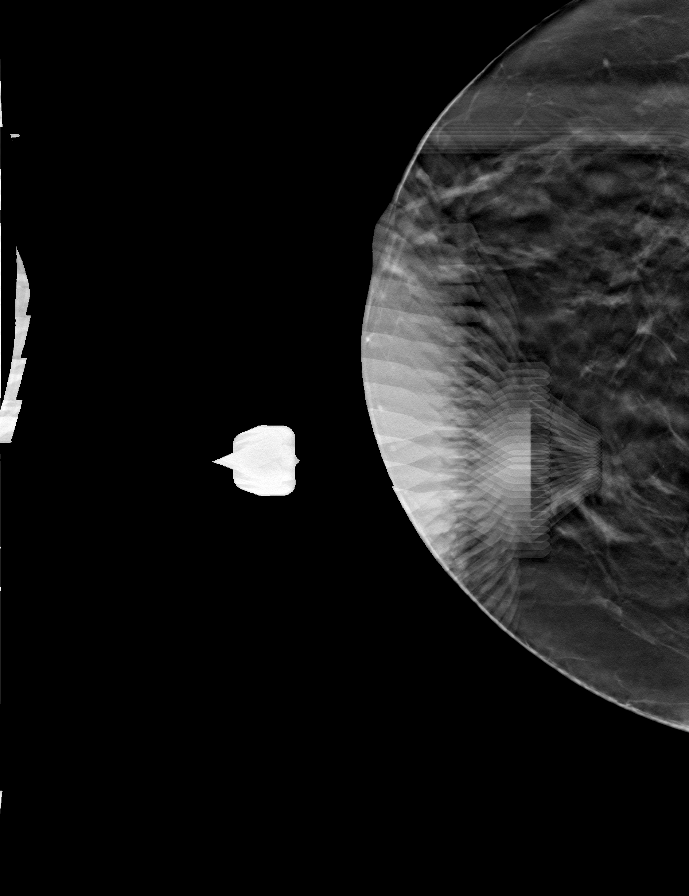

[8 of 17 positions shown; findings below may reference images not displayed]



Using sterile technique and 1% Lidocaine as local anesthetic, under
stereotactic guidance, a 9 gauge vacuum assisted device was used to
perform core needle biopsy of subtle distortion medial right breast
using a cranial approach.

Lesion quadrant: Upper inner quadrant

At the conclusion of the procedure, X shaped tissue marker clip was
deployed into the biopsy cavity. Follow-up 2-view mammogram was
performed and dictated separately.
IMPRESSION: Stereotactic-guided biopsy of possible subtle right breast
distortion. No apparent complications.

ADDENDUM:
PATHOLOGY revealed: A. BREAST, RIGHT, MEDIAL; STEREOTACTIC-GUIDED
CORE BIOPSY: - INVASIVE MAMMARY CARCINOMA WITH FEATURES OF TUBULAR
CARCINOMA. - FOCAL ATYPICAL DUCTAL HYPERPLASIA (ADH). - CLUSTERS OF
APOCRINE MICROCYSTS. 2 mm in this sample. Grade 1. Ductal carcinoma
in situ: Not identified. Lymphovascular invasion: Not identified.

Pathology results are CONCORDANT with imaging findings, per Dr. Bolyki
Rages.

Pathology results and recommendations below were discussed with
patient by telephone on 10/23/2019. Patient reported biopsy site
within normal limits with slight tenderness at the site. Post biopsy
care instructions were reviewed, questions were answered and my
direct phone number was provided to patient. Patient was instructed
to call [HOSPITAL] if any concerns or questions arise
related to the biopsy.

Recommendations:

1. Request for surgical consultation was relayed to Sura Zatarain RN
and Stanley Jeffly Malivert RN at [HOSPITAL] [HOSPITAL] by Chikayuki
Thrwat RN on 10/23/2019.

2. Consider bilateral breast MRI due to heterogeneous breast
density.

Pathology results reported by Yiu Wah Mayleung RN on 10/24/2019.



Using sterile technique and 1% Lidocaine as local anesthetic, under
stereotactic guidance, a 9 gauge vacuum assisted device was used to
perform core needle biopsy of subtle distortion medial right breast
using a cranial approach.

Lesion quadrant: Upper inner quadrant

At the conclusion of the procedure, X shaped tissue marker clip was
deployed into the biopsy cavity. Follow-up 2-view mammogram was
performed and dictated separately.
IMPRESSION: Stereotactic-guided biopsy of possible subtle right breast
distortion. No apparent complications.

## 2022-07-08 DIAGNOSIS — E1122 Type 2 diabetes mellitus with diabetic chronic kidney disease: Secondary | ICD-10-CM | POA: Diagnosis not present

## 2022-07-08 DIAGNOSIS — M1711 Unilateral primary osteoarthritis, right knee: Secondary | ICD-10-CM | POA: Diagnosis not present

## 2022-07-08 DIAGNOSIS — G8929 Other chronic pain: Secondary | ICD-10-CM | POA: Diagnosis not present

## 2022-07-30 DIAGNOSIS — L309 Dermatitis, unspecified: Secondary | ICD-10-CM | POA: Diagnosis not present

## 2022-07-30 DIAGNOSIS — G4733 Obstructive sleep apnea (adult) (pediatric): Secondary | ICD-10-CM | POA: Diagnosis not present

## 2022-07-30 DIAGNOSIS — E1122 Type 2 diabetes mellitus with diabetic chronic kidney disease: Secondary | ICD-10-CM | POA: Diagnosis not present

## 2022-07-30 DIAGNOSIS — Z1331 Encounter for screening for depression: Secondary | ICD-10-CM | POA: Diagnosis not present

## 2022-07-30 DIAGNOSIS — I129 Hypertensive chronic kidney disease with stage 1 through stage 4 chronic kidney disease, or unspecified chronic kidney disease: Secondary | ICD-10-CM | POA: Diagnosis not present

## 2022-07-30 DIAGNOSIS — E119 Type 2 diabetes mellitus without complications: Secondary | ICD-10-CM | POA: Diagnosis not present

## 2022-07-30 DIAGNOSIS — Z0000 Encounter for general adult medical examination without abnormal findings: Secondary | ICD-10-CM | POA: Diagnosis not present

## 2022-07-30 DIAGNOSIS — E7800 Pure hypercholesterolemia, unspecified: Secondary | ICD-10-CM | POA: Diagnosis not present

## 2022-07-30 DIAGNOSIS — C50211 Malignant neoplasm of upper-inner quadrant of right female breast: Secondary | ICD-10-CM | POA: Diagnosis not present

## 2022-07-30 DIAGNOSIS — K219 Gastro-esophageal reflux disease without esophagitis: Secondary | ICD-10-CM | POA: Diagnosis not present

## 2022-08-24 ENCOUNTER — Ambulatory Visit: Payer: Medicare PPO | Admitting: Internal Medicine

## 2022-08-24 VITALS — BP 125/84 | HR 75 | Resp 14 | Ht 65.0 in | Wt 194.0 lb

## 2022-08-24 DIAGNOSIS — I10 Essential (primary) hypertension: Secondary | ICD-10-CM | POA: Diagnosis not present

## 2022-08-24 DIAGNOSIS — Z7189 Other specified counseling: Secondary | ICD-10-CM

## 2022-08-24 DIAGNOSIS — G4733 Obstructive sleep apnea (adult) (pediatric): Secondary | ICD-10-CM

## 2022-08-24 NOTE — Progress Notes (Signed)
[facility] [address] [place], [place] [address]  Pulmonary Sleep Medicine   Office Visit Note  Patient Name: Barbara [name] DOB: 1951/01/26 MRN [id number]    Chief Complaint: Obstructive Sleep Apnea visit  Brief History:  Barbara [name] is seen today for an annual follow up on CPAP at 10 cmh20.  The patient has a 8 year history of sleep apnea. Patient is using PAP nightly.  The patient feels rested after sleeping with PAP.  The patient reports benefiting from PAP use. Reported sleepiness is improved and the Epworth Sleepiness Score is 2 out of 24. The patient does take naps. The patient complains of the following: No complaints.  The compliance download shows 100% compliance with an average use time of 8:13 hours. The AHI is 1.2.  The patient does not complain of limb movements disrupting sleep.  ROS  General: (-) fever, (-) chills, (-) night sweat Nose and Sinuses: (-) nasal stuffiness or itchiness, (-) postnasal drip, (-) nosebleeds, (-) sinus trouble. Mouth and Throat: (-) sore throat, (-) hoarseness. Neck: (-) swollen glands, (-) enlarged thyroid, (-) neck pain. Respiratory: - cough, - shortness of breath, - wheezing. Neurologic: - numbness, - tingling. Psychiatric: - anxiety, - depression   Current Medication: Outpatient Encounter Medications as of 08/24/2022  Medication Sig   amLODipine (NORVASC) 5 MG tablet Take 5 mg by mouth at bedtime.    anastrozole (ARIMIDEX) 1 MG tablet Take 1 tablet (1 mg total) by mouth daily.   aspirin 81 MG EC tablet Take 81 mg by mouth daily.    azelastine (ASTELIN) 0.1 % nasal spray Place into the nose.   calcipotriene-betamethasone (TACLONEX) external suspension Apply to affected area ears at night until improved.   Calcium Carbonate-Vit D-Min (CALCIUM 1200 PO) Take by mouth.   cetirizine (ZYRTEC) 10 MG tablet Take 10 mg by mouth every morning.    Cholecalciferol (VITAMIN D) 50 MCG (2000 UT) CAPS Take 2,000 Units by mouth daily.    CINNAMON PO Take 1,000 mg by mouth in the morning and at bedtime.    clobetasol cream (TEMOVATE) 0.05 % Apply 1 application topically 2 (two) times daily.   fluconazole (DIFLUCAN) 200 MG tablet Take 1 tablet (200 mg total) by mouth daily.   Krill Oil 500 MG CAPS Take 500 mg by mouth daily.   lisinopril (ZESTRIL) 2.5 MG tablet Take 2.5 mg by mouth daily.   lovastatin (MEVACOR) 40 MG tablet Take 40 mg by mouth at bedtime.    MAGNESIUM GLYCINATE PO Take 200 mg by mouth.   meloxicam (MOBIC) 15 MG tablet Take 1 tablet by mouth daily.   mometasone (ELOCON) 0.1 % cream Apply 1 Application topically daily as needed (Rash). Qd to aa ears prn flares   pantoprazole (PROTONIX) 40 MG tablet Take 40 mg by mouth every morning.    No facility-administered encounter medications on file as of 08/24/2022.    Surgical History: Past Surgical History:  Procedure Laterality Date   APPENDECTOMY     BREAST BIOPSY Right 10/20/2019   Affirm bx-"X" clip positive   BREAST BIOPSY Left 11/06/2020   stereo bx, x clip, benign   BREAST BIOPSY Left 11/06/2020   Stereo bx-Distortion #2-"Coil" clip-benign   BREAST BIOPSY Left 11/12/2021   stereo bx/ ribbon clip/ path pending   BREAST CYST EXCISION Right    BREAST CYST EXCISION Right    BREAST CYST EXCISION Left    BREAST LUMPECTOMY     BREAST SURGERY     CHOLECYSTECTOMY  PART MASTECTOMY,RADIO FREQUENCY LOCALIZER,AXILLARY SENTINEL NODE BIOPSY Right 10/30/2019   Procedure: PART MASTECTOMY,RADIO FREQUENCY LOCALIZER,AXILLARY SENTINEL NODE BIOPSY;  Surgeon: Carolan Shiver, MD;  Location: [facility] ORS;  Service: General;  Laterality: Right;    Medical History: Past Medical History:  Diagnosis Date   Actinic keratosis    Cancer (HCC)    Family history of breast cancer    Family history of lung cancer    Family history of pancreatic cancer    Family history of uterine cancer    GERD (gastroesophageal reflux disease)    Headache    migraines   Hypertension     Melanoma (HCC) 08/08/2020   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, R lat mid back, exc 09/18/20   Osteopenia 02/15/2020   Personal history of radiation therapy    Pre-diabetes    Sleep apnea    uses cpap    Family History: Non contributory to the present illness  Social History: Social History   Socioeconomic History   Marital status: Married    Spouse name: Not on file   Number of children: Not on file   Years of education: Not on file   Highest education level: Not on file  Occupational History   Not on file  Tobacco Use   Smoking status: Never   Smokeless tobacco: Never  Vaping Use   Vaping status: Never Used  Substance and Sexual Activity   Alcohol use: No   Drug use: No   Sexual activity: Not on file  Other Topics Concern   Not on file  Social History Narrative   Not on file   Social Determinants of Health   Financial Resource Strain: Low Risk  (07/30/2022)   Received from [facility]   Overall Financial Resource Strain (CARDIA)    Difficulty of Paying Living Expenses: Not hard at all  Food Insecurity: No Food Insecurity (07/30/2022)   Received from [facility]   Hunger Vital Sign    Worried About Running Out of Food in the Last Year: Never true    Ran Out of Food in the Last Year: Never true  Transportation Needs: No Transportation Needs (07/30/2022)   Received from [facility] - Transportation    In the past 12 months, has lack of transportation kept you from medical appointments or from getting medications?: No    Lack of Transportation (Non-Medical): No  Physical Activity: Not on file  Stress: Not on file  Social Connections: Not on file  Intimate Partner Violence: Not on file    Vital Signs: Blood pressure 125/84, pulse 75, resp. rate 14, height 5\' 5"  (1.651 m), weight 194 lb (88 kg), SpO2 96%. Body mass index is 32.28 kg/m.    Examination: General Appearance: The patient is  well-developed, well-nourished, and in no distress. Neck Circumference: 40 cm Skin: Gross inspection of skin unremarkable. Head: normocephalic, no gross deformities. Eyes: no gross deformities noted. ENT: ears appear grossly normal Neurologic: Alert and oriented. No involuntary movements.  STOP BANG RISK ASSESSMENT S (snore) Have you been told that you snore?     NO   T (tired) Are you often tired, fatigued, or sleepy during the day?   NO  O (obstruction) Do you stop breathing, choke, or gasp during sleep? NO   P (pressure) Do you have or are you being treated for high blood pressure? YES   B (BMI) Is your body index greater than 35 kg/m? NO  A (age) Are you 73 years old or older? YES   N (neck) Do you have a neck circumference greater than 16 inches?   NO   G (gender) Are you a female? NO   TOTAL STOP/BANG "YES" ANSWERS 2       A STOP-Bang score of 2 or less is considered low risk, and a score of 5 or more is high risk for having either moderate or severe OSA. For people who score 3 or 4, doctors may need to perform further assessment to determine how likely they are to have OSA.         EPWORTH SLEEPINESS SCALE:  Scale:  (0)= no chance of dozing; (1)= slight chance of dozing; (2)= moderate chance of dozing; (3)= high chance of dozing  Chance  Situtation    Sitting and reading: 0    Watching TV: 1    Sitting Inactive in public: 0    As a passenger in car: 1      Lying down to rest: 1    Sitting and talking: 0    Sitting quielty after lunch: 1    In a car, stopped in traffic: 0   TOTAL SCORE:   4 out of 24    SLEEP STUDIES:  PSG (02/26/14) AHI 23, REM AHI 85, min SPO2 74%   CPAP COMPLIANCE DATA:  Date Range: 08/20/21 - 08/19/22  Average Daily Use: 8:13 hours  Median Use: 8:17 hours  Compliance for > 4 Hours: 365 days  AHI: 1.2 respiratory events per hour  Days Used: 365/365  Mask Leak: 28.6  95th Percentile Pressure: 10  cmh20         LABS: No results found for this or any previous visit (from the past 2160 hour(s)).  Radiology: No results found.  No results found.  No results found.    Assessment and Plan: Patient Active Problem List   Diagnosis Date Noted   Aromatase inhibitor use 03/21/2021   OSA on CPAP 03/10/2021   CPAP use counseling 03/10/2021   Osteopenia 02/15/2020   Elevated fasting blood sugar 11/28/2019   GERD (gastroesophageal reflux disease) 11/28/2019   Hyperlipidemia 11/28/2019   Hypertension, essential, benign 11/28/2019   Genetic testing 11/21/2019   Family history of breast cancer    Family history of pancreatic cancer    Family history of uterine cancer    Family history of lung cancer    Malignant neoplasm of upper-inner quadrant of right breast in female, estrogen receptor positive (HCC) 10/26/2019   Obesity (BMI 30.0-34.9) 08/30/2017   Sleep apnea 02/21/2014   Arthritis, 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 OSA on CPAP The patient does tolerate PAP and reports great  benefit from PAP use. The patient was reminded how to clean equipment and advised to replace supplies routinely. The patient was also counselled on weight loss. The compliance is excellent. The AHI is 1.2.   OSA on cpap- controlled. Continue with excellent compliance with pap. CPAP continues to be medically necessary to treat this patient's OSA. F/u one year.    2. CPAP use counseling CPAP Counseling: had a lengthy discussion with the patient regarding the importance of PAP therapy in management of the sleep apnea. Patient appears to understand the risk factor reduction and also understands the risks associated with untreated sleep apnea. Patient will try to make a good faith effort to remain compliant with therapy. Also instructed the patient on proper cleaning of the device including the water must be changed daily  if possible and use of distilled water is preferred. Patient understands that the machine  should be regularly cleaned with appropriate recommended cleaning solutions that do not damage the PAP machine for example given white vinegar and water rinses. Other methods such as ozone treatment may not be as good as these simple methods to achieve cleaning.   3. Hypertension, essential, benign Hypertension Counseling:   The following hypertensive lifestyle modification were recommended and discussed:  1. Limiting alcohol intake to less than 1 oz/day of ethanol:(24 oz of beer or 8 oz of wine or 2 oz of 100-proof whiskey). 2. Take baby ASA 81 mg daily. 3. Importance of regular aerobic exercise and losing weight. 4. Reduce dietary saturated fat and cholesterol intake for overall cardiovascular health. 5. Maintaining adequate dietary potassium, calcium, and magnesium intake. 6. Regular monitoring of the blood pressure. 7. Reduce sodium intake to less than 100 mmol/day (less than 2.3 gm of sodium or less than 6 gm of sodium choride)      .    General Counseling: I have discussed the findings of the evaluation and examination with Barbara [name].  I have also discussed any further diagnostic evaluation thatmay be needed or ordered today. Barbara [name] verbalizes understanding of the findings of todays visit. We also reviewed her medications today and discussed drug interactions and side effects including but not limited excessive drowsiness and altered mental states. We also discussed that there is always a risk not just to her but also people around her. she has been encouraged to call the office with any questions or concerns that should arise related to todays visit.  No orders of the defined types were placed in this encounter.       I have personally obtained a history, examined the patient, evaluated laboratory and imaging results, formulated the assessment and plan and placed orders. This patient was seen today by Barbara Kluver, PA-C in collaboration with Dr. Freda Munro.   Yevonne Pax, MD  [facility] Diplomate ABMS Pulmonary Critical Care Medicine and Sleep Medicine

## 2022-08-24 NOTE — Patient Instructions (Signed)

## 2022-08-27 ENCOUNTER — Other Ambulatory Visit: Payer: Self-pay | Admitting: General Surgery

## 2022-08-27 DIAGNOSIS — Z853 Personal history of malignant neoplasm of breast: Secondary | ICD-10-CM

## 2022-09-04 ENCOUNTER — Other Ambulatory Visit: Payer: Self-pay | Admitting: Dermatology

## 2022-09-23 ENCOUNTER — Inpatient Hospital Stay (HOSPITAL_BASED_OUTPATIENT_CLINIC_OR_DEPARTMENT_OTHER): Payer: Medicare PPO | Admitting: Oncology

## 2022-09-23 ENCOUNTER — Inpatient Hospital Stay: Payer: Medicare PPO | Attending: Oncology

## 2022-09-23 ENCOUNTER — Encounter: Payer: Self-pay | Admitting: Oncology

## 2022-09-23 ENCOUNTER — Inpatient Hospital Stay: Payer: Medicare PPO

## 2022-09-23 VITALS — BP 127/77 | HR 88 | Temp 97.0°F | Resp 18 | Wt 197.0 lb

## 2022-09-23 DIAGNOSIS — C50211 Malignant neoplasm of upper-inner quadrant of right female breast: Secondary | ICD-10-CM | POA: Insufficient documentation

## 2022-09-23 DIAGNOSIS — Z79811 Long term (current) use of aromatase inhibitors: Secondary | ICD-10-CM | POA: Diagnosis not present

## 2022-09-23 DIAGNOSIS — M8580 Other specified disorders of bone density and structure, unspecified site: Secondary | ICD-10-CM

## 2022-09-23 DIAGNOSIS — Z803 Family history of malignant neoplasm of breast: Secondary | ICD-10-CM | POA: Insufficient documentation

## 2022-09-23 DIAGNOSIS — Z85820 Personal history of malignant melanoma of skin: Secondary | ICD-10-CM | POA: Diagnosis not present

## 2022-09-23 DIAGNOSIS — I10 Essential (primary) hypertension: Secondary | ICD-10-CM | POA: Insufficient documentation

## 2022-09-23 DIAGNOSIS — Z801 Family history of malignant neoplasm of trachea, bronchus and lung: Secondary | ICD-10-CM | POA: Insufficient documentation

## 2022-09-23 DIAGNOSIS — Z8049 Family history of malignant neoplasm of other genital organs: Secondary | ICD-10-CM | POA: Insufficient documentation

## 2022-09-23 DIAGNOSIS — Z9049 Acquired absence of other specified parts of digestive tract: Secondary | ICD-10-CM | POA: Insufficient documentation

## 2022-09-23 DIAGNOSIS — N6022 Fibroadenosis of left breast: Secondary | ICD-10-CM | POA: Diagnosis not present

## 2022-09-23 DIAGNOSIS — Z170 Estrogen receptor positive status [ER+]: Secondary | ICD-10-CM | POA: Diagnosis not present

## 2022-09-23 DIAGNOSIS — Z800 Family history of malignant neoplasm of digestive organs: Secondary | ICD-10-CM | POA: Insufficient documentation

## 2022-09-23 DIAGNOSIS — N6042 Mammary duct ectasia of left breast: Secondary | ICD-10-CM | POA: Insufficient documentation

## 2022-09-23 DIAGNOSIS — Z79899 Other long term (current) drug therapy: Secondary | ICD-10-CM | POA: Diagnosis not present

## 2022-09-23 LAB — CMP (CANCER CENTER ONLY)
ALT: 16 U/L (ref 0–44)
AST: 18 U/L (ref 15–41)
Albumin: 4.2 g/dL (ref 3.5–5.0)
Alkaline Phosphatase: 79 U/L (ref 38–126)
Anion gap: 10 (ref 5–15)
BUN: 14 mg/dL (ref 8–23)
CO2: 23 mmol/L (ref 22–32)
Calcium: 9.4 mg/dL (ref 8.9–10.3)
Chloride: 106 mmol/L (ref 98–111)
Creatinine: 1.05 mg/dL — ABNORMAL HIGH (ref 0.44–1.00)
GFR, Estimated: 56 mL/min — ABNORMAL LOW (ref >60)
Glucose, Bld: 127 mg/dL — ABNORMAL HIGH (ref 70–99)
Potassium: 3.5 mmol/L (ref 3.5–5.1)
Sodium: 139 mmol/L (ref 135–145)
Total Bilirubin: 1.1 mg/dL (ref 0.3–1.2)
Total Protein: 7.4 g/dL (ref 6.5–8.1)

## 2022-09-23 LAB — CBC WITH DIFFERENTIAL (CANCER CENTER ONLY)
Abs Immature Granulocytes: 0.03 10*3/uL (ref 0.00–0.07)
Basophils Absolute: 0.1 10*3/uL (ref 0.0–0.1)
Basophils Relative: 1 %
Eosinophils Absolute: 0.1 10*3/uL (ref 0.0–0.5)
Eosinophils Relative: 2 %
HCT: 42 % (ref 36.0–46.0)
Hemoglobin: 13.5 g/dL (ref 12.0–15.0)
Immature Granulocytes: 0 %
Lymphocytes Relative: 21 %
Lymphs Abs: 1.6 10*3/uL (ref 0.7–4.0)
MCH: 26.3 pg (ref 26.0–34.0)
MCHC: 32.1 g/dL (ref 30.0–36.0)
MCV: 81.9 fL (ref 80.0–100.0)
Monocytes Absolute: 0.4 10*3/uL (ref 0.1–1.0)
Monocytes Relative: 5 %
Neutro Abs: 5.3 10*3/uL (ref 1.7–7.7)
Neutrophils Relative %: 71 %
Platelet Count: 231 10*3/uL (ref 150–400)
RBC: 5.13 MIL/uL — ABNORMAL HIGH (ref 3.87–5.11)
RDW: 14.1 % (ref 11.5–15.5)
WBC Count: 7.4 10*3/uL (ref 4.0–10.5)
nRBC: 0 % (ref 0.0–0.2)

## 2022-09-23 MED ORDER — SODIUM CHLORIDE 0.9 % IV SOLN
INTRAVENOUS | Status: DC | PRN
  Filled 2022-09-23: qty 250

## 2022-09-23 MED ORDER — ZOLEDRONIC ACID 4 MG/100ML IV SOLN
4.0000 mg | Freq: Once | INTRAVENOUS | Status: AC
  Administered 2022-09-23: 4 mg via INTRAVENOUS
  Filled 2022-09-23: qty 100

## 2022-09-23 MED ORDER — ANASTROZOLE 1 MG PO TABS: 1.0000 mg | ORAL_TABLET | Freq: Every day | ORAL | 3 refills | Status: DC

## 2022-09-23 NOTE — Assessment & Plan Note (Signed)
12/29/21 DEXA showed osteopenia 10-year major osteoporotic fracture right is 15 %. Continue calcium and vitamin D supplementation Proceed with Zometa today.

## 2022-09-23 NOTE — Progress Notes (Signed)
Hematology/Oncology Progress note [phone] [phone]     CHIEF COMPLAINTS/REASON FOR VISIT:  Follow up for breast cancer  ASSESSMENT & PLAN:   Malignant neoplasm of upper-inner quadrant of right breast in female, estrogen receptor positive (HCC) #Stage IA right breast cancer, s/p lumpectomy/SLNB and radiation. Labs reviewed and discussed with patient. Continue Arimidex 1 mg daily. Plan total 5 years of endocrine therapy-till January 2027 Continue annual mammogram - she gets mammogram ordered through Barbara [name]'s office.   Aromatase inhibitor use 12/29/21 DEXA showed osteopenia 10-year major osteoporotic fracture right is 15 %. Continue calcium and vitamin D supplementation Proceed with Zometa today.  Orders Placed This Encounter  Procedures   CMP (Cancer Center only)    Standing Status:   Future    Standing Expiration Date:   09/23/2023   CBC with Differential (Cancer Center Only)    Standing Status:   Future    Standing Expiration Date:   09/23/2023   Follow up in 6 months.  All questions were answered. The patient knows to call the clinic with any problems, questions or concerns.  Barbara Patience, MD, PhD [facility] Hematology Oncology 09/23/2022   HISTORY OF PRESENTING ILLNESS:   Barbara [name] is a  72 y.o.  female presents for follow up of breast cancer  Oncology History  Malignant neoplasm of upper-inner quadrant of right breast in female, estrogen receptor positive (HCC)  10/13/2019 Mammogram   Bilateral Diagnostic mammogram  Showed 5mm spiculated mass in the right breast 1:00. Unchanged left axillary probabaly benign mass- 1 year stability. 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 ER 90% positive, PR 1-0% positive, HER2 negative.    Family history of breast cancer: Barbara [name], and Barbara [name] first cousin Family history of other cancers: lung cancer in mother, pancreatic cancer in father, unknown cancer in pat uncle.  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 VUS in BRCA2 called c.2980G>A identified on the [facility] Multi-Cancer Panel. The report date is 11/17/2019.  The Multi-Cancer Panel offered by [facility] includes sequencing and/or deletion duplication testing of the following 85 genes: AIP, ALK, APC, ATM, AXIN2,BAP1,  BARD1, BLM, BMPR1A, BRCA1, BRCA2, BRIP1, CASR, CDC73, CDH1, CDK4, CDKN1B, CDKN1C, CDKN2A (p14ARF), CDKN2A (p16INK4a), CEBPA, CHEK2, CTNNA1, DICER1, DIS3L2, EGFR (c.2369C>T, p.Thr790Met variant only), EPCAM (Deletion/duplication testing only), FH, FLCN, GATA2, GPC3, GREM1 (Promoter region deletion/duplication testing only), HOXB13 (c.251G>A, p.Gly84Glu), HRAS, KIT, MAX, MEN1, MET, MITF (c.952G>A, p.Glu318Lys variant only), MLH1, MSH2, MSH3, MSH6, MUTYH, NBN, NF1, NF2, NTHL1, PALB2, PDGFRA, PHOX2B, PMS2, POLD1, POLE, POT1, PRKAR1A, PTCH1, PTEN, RAD50, RAD51C, RAD51D, RB1, RECQL4, RET, RNF43, RUNX1, SDHAF2, SDHA (sequence changes only), SDHB,  SDHC, SDHD, SMAD4, SMARCA4, SMARCB1, SMARCE1, STK11, SUFU, TERC, TERT, TMEM127, TP53, TSC1, TSC2, VHL,  WRN and WT1.    10/30/2019 Surgery   patient underwent right lumpectomy and sentinel lymph node biopsy.  Stage IA right ER 90%/PR 1-10% positive, HER-2 negative right breast invasive mammary carcinoma, pT1a pN0 tubular carcinoma, grade 1.    01/18/2020 -  Radiation Therapy   finished adjuvant Breast radiation.    02/2020 -  Anti-estrogen oral therapy   started on Arimidex    10/30/2020 Mammogram   bilateral diagnostic mammogram showed a subtle left breast architectural distortion without suspicious sonographic correlate.   -11/06/2020, left breast upper inner stereotactic biopsy showed benign breast tissue with fibrocystic changes with ductal ectasia. Psudoangiomatous stromal hyperplasia. usual ductal hyperplasia. Negative for atypia and malignancy. Left breast upper inner posterior stereotactic biopsy showed benign breast tissue with sclerosing adenosis. Fibrocystic changes. Negative for atypia and malignancy     10/31/2021 Mammogram   Bilateral diagnostic mammogram  1. There is a 9 mm group of indeterminate amorphous calcifications in the LEFT upper outer breast at posterior depth. Recommend stereotactic guided biopsy for definitive characterization. 2. No mammographic evidence of malignancy in the RIGHT breast.  11/12/21 Left upper outer breast biopsy showed benign fibrofatty breast parenchyma with fibrocystic and fibroadenomatoid changes, and associated coarse dystrophic calcifications. Negative for atypical proliferative breast disease    12/29/2021 Imaging   DEXA showed osteopenia FRAX major osteoporotic fracture is 15.8% within the next ten years.      INTERVAL HISTORY Barbara [name] is a 72 y.o. female who has above history reviewed by me today presents for follow up visit for management of right breast cancer. Patient reports feeling well.  She has no new complaints. Patient tolerates Arimidex with manageable side effects.  She is on Zometa every 6 months. She denies any new  breast concerns.   Review of Systems  Constitutional:  Negative for appetite change, chills, fatigue and fever.  HENT:   Negative for hearing loss and voice change.   Eyes:  Negative for eye problems.  Respiratory:  Negative for chest tightness and cough.   Cardiovascular:  Negative for chest pain.  Gastrointestinal:  Negative for abdominal distention, abdominal pain and blood in stool.  Endocrine: Negative for hot flashes.  Genitourinary:  Negative for difficulty urinating and frequency.   Musculoskeletal:  Negative for arthralgias.  Skin:  Negative for itching and rash.  Neurological:  Negative for extremity weakness.  Hematological:  Negative for adenopathy.  Psychiatric/Behavioral:  Negative for confusion.     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, R lat mid back, exc 09/18/20   Osteopenia 02/15/2020   Personal history of radiation therapy    Pre-diabetes    Sleep apnea    uses cpap    SURGICAL HISTORY: Past Surgical History:  Procedure Laterality Date   APPENDECTOMY     BREAST BIOPSY Right 10/20/2019   Affirm bx-"X" clip positive   BREAST BIOPSY Left 11/06/2020   stereo bx, x clip, benign   BREAST BIOPSY Left 11/06/2020  Stereo bx-Distortion #2-"Coil" clip-benign   BREAST BIOPSY Left 11/12/2021   stereo bx/ ribbon clip/ path pending   BREAST CYST EXCISION Right    BREAST CYST EXCISION Right    BREAST CYST EXCISION Left    BREAST LUMPECTOMY     BREAST SURGERY     CHOLECYSTECTOMY     PART MASTECTOMY,RADIO FREQUENCY LOCALIZER,AXILLARY SENTINEL NODE BIOPSY Right 10/30/2019   Procedure: PART MASTECTOMY,RADIO FREQUENCY  LOCALIZER,AXILLARY SENTINEL NODE BIOPSY;  Surgeon: Carolan Shiver, MD;  Location: [facility] ORS;  Service: General;  Laterality: Right;    SOCIAL HISTORY: Social History   Socioeconomic History   Marital status: Married    Spouse name: Not on file   Number of children: Not on file   Years of education: Not on file   Highest education level: Not on file  Occupational History   Not on file  Tobacco Use   Smoking status: Never   Smokeless tobacco: Never  Vaping Use   Vaping status: Never Used  Substance and Sexual Activity   Alcohol use: No   Drug use: No   Sexual activity: Not on file  Other Topics Concern   Not on file  Social History Narrative   Not on file   Social Determinants of Health   Financial Resource Strain: Low Risk  (07/30/2022)   Received from [facility]   Overall Financial Resource Strain (CARDIA)    Difficulty of Paying Living Expenses: Not hard at all  Food Insecurity: No Food Insecurity (07/30/2022)   Received from [facility]   Hunger Vital Sign    Worried About Running Out of Food in the Last Year: Never true    Ran Out of Food in the Last Year: Never true  Transportation Needs: No Transportation Needs (07/30/2022)   Received from [facility] - Transportation    In the past 12 months, has lack of transportation kept you from medical appointments or from getting medications?: No    Lack of Transportation (Non-Medical): No  Physical Activity: Not on file  Stress: Not on file  Social Connections: Not on file  Intimate Partner Violence: Not on file    FAMILY HISTORY: Family History  Problem Relation Age of Onset   Breast cancer Paternal Aunt    Lung cancer Mother    Pancreatic cancer Father    Cancer Paternal Uncle        unk type   Cancer Paternal Aunt        unk type, possibly breast   Breast cancer Cousin    Cancer Cousin        unk type    ALLERGIES:  has No Known  Allergies.  MEDICATIONS:  Current Outpatient Medications  Medication Sig Dispense Refill   amLODipine (NORVASC) 5 MG tablet Take 5 mg by mouth at bedtime.      aspirin 81 MG EC tablet Take 81 mg by mouth daily.      azelastine (ASTELIN) 0.1 % nasal spray Place into the nose.     Biotin 16109 MCG TABS Take 1,000 tablets by mouth 2 (two) times daily.     calcipotriene-betamethasone (TACLONEX) external suspension Apply to affected area ears at night until improved. 60 g 1   Calcium Carbonate-Vit D-Min (CALCIUM 1200 PO) Take by mouth.     cetirizine (ZYRTEC) 10 MG tablet Take 10 mg by mouth every morning.      Cholecalciferol (VITAMIN D) 50 MCG (2000 UT) CAPS Take  2,000 Units by mouth daily.     CINNAMON PO Take 1,000 mg by mouth in the morning and at bedtime.      clobetasol cream (TEMOVATE) 0.05 % Apply 1 application topically 2 (two) times daily. 45 g 1   fluconazole (DIFLUCAN) 200 MG tablet Take 1 tablet (200 mg total) by mouth daily. 15 tablet 0   Krill Oil 500 MG CAPS Take 500 mg by mouth daily.     lisinopril (ZESTRIL) 2.5 MG tablet Take 2.5 mg by mouth daily.     lovastatin (MEVACOR) 40 MG tablet Take 40 mg by mouth at bedtime.      MAGNESIUM GLYCINATE PO Take 200 mg by mouth.     meloxicam (MOBIC) 15 MG tablet Take 1 tablet by mouth daily.     mometasone (ELOCON) 0.1 % cream APPLY TO THE AFFECTED AREAS OF EARS DAILY AS NEEDED FOR RASH FOR FLARES 45 g 1   pantoprazole (PROTONIX) 40 MG tablet Take 40 mg by mouth every morning.      anastrozole (ARIMIDEX) 1 MG tablet Take 1 tablet (1 mg total) by mouth daily. 90 tablet 3   No current facility-administered medications for this visit.   Facility-Administered Medications Ordered in Other Visits  Medication Dose Route Frequency Provider Last Rate Last Admin   0.9 %  sodium chloride infusion   Intravenous PRN Barbara Patience, MD   Stopped at 09/23/22 1416     PHYSICAL EXAMINATION: ECOG PERFORMANCE STATUS: 0 - Asymptomatic Vitals:   09/23/22  1309  BP: 127/77  Pulse: 88  Resp: 18  Temp: (!) 97 F (36.1 C)  SpO2: 96%   Filed Weights   09/23/22 1309  Weight: 197 lb (89.4 kg)    Physical Exam Constitutional:      General: She is not in acute distress. HENT:     Head: Normocephalic and atraumatic.  Eyes:     General: No scleral icterus. Cardiovascular:     Rate and Rhythm: Normal rate and regular rhythm.     Heart sounds: Normal heart sounds.  Pulmonary:     Effort: Pulmonary effort is normal. No respiratory distress.     Breath sounds: No wheezing.  Abdominal:     General: Bowel sounds are normal. There is no distension.     Palpations: Abdomen is soft.  Musculoskeletal:        General: No deformity. Normal range of motion.     Cervical back: Normal range of motion and neck supple.  Skin:    General: Skin is warm and dry.     Findings: No erythema or rash.  Neurological:     Mental Status: She is alert and oriented to person, place, and time. Mental status is at baseline.     Cranial Nerves: No cranial nerve deficit.     Coordination: Coordination normal.  Psychiatric:        Mood and Affect: Mood normal.      LABORATORY DATA:  I have reviewed the data as listed     Latest Ref Rng & Units 09/23/2022   12:55 PM 03/25/2022    1:03 PM 09/22/2021   12:45 PM  CBC  WBC 4.0 - 10.5 K/uL 7.4  5.6  6.3   Hemoglobin 12.0 - 15.0 g/dL 16.1  09.6  04.5   Hematocrit 36.0 - 46.0 % 42.0  40.0  42.6   Platelets 150 - 400 K/uL 231  174  182       Latest Ref Rng &  Units 09/23/2022   12:55 PM 03/25/2022    1:03 PM 09/22/2021   12:45 PM  CMP  Glucose 70 - 99 mg/dL 644  034  742   BUN 8 - 23 mg/dL 14  24  19    Creatinine 0.44 - 1.00 mg/dL 5.95  6.38  7.56   Sodium 135 - 145 mmol/L 139  141  139   Potassium 3.5 - 5.1 mmol/L 3.5  4.4  4.0   Chloride 98 - 111 mmol/L 106  106  105   CO2 22 - 32 mmol/L 23  27  27    Calcium 8.9 - 10.3 mg/dL 9.4  9.4  9.9   Total Protein 6.5 - 8.1 g/dL 7.4  7.0  7.3   Total Bilirubin  0.3 - 1.2 mg/dL 1.1  1.1  1.9   Alkaline Phos 38 - 126 U/L 79  78  60   AST 15 - 41 U/L 18  11  13    ALT 0 - 44 U/L 16  12  18     RADIOGRAPHIC STUDIES: I have personally reviewed the radiological images as listed and agreed with the findings in the report. No results found.

## 2022-09-23 NOTE — Assessment & Plan Note (Addendum)
#  Stage IA right breast cancer, s/p lumpectomy/SLNB and radiation. Labs reviewed and discussed with patient. Continue Arimidex 1 mg daily. Plan total 5 years of endocrine therapy-till January 2027 Continue annual mammogram - she gets mammogram ordered through Dr.Cintron's office.

## 2022-11-02 ENCOUNTER — Ambulatory Visit
Admission: RE | Admit: 2022-11-02 | Discharge: 2022-11-02 | Disposition: A | Discharge status: Home / Self Care | Payer: Medicare PPO | Source: Ambulatory Visit | Attending: General Surgery | Admitting: General Surgery

## 2022-11-02 DIAGNOSIS — R928 Other abnormal and inconclusive findings on diagnostic imaging of breast: Secondary | ICD-10-CM | POA: Diagnosis not present

## 2022-11-02 DIAGNOSIS — Z853 Personal history of malignant neoplasm of breast: Secondary | ICD-10-CM | POA: Insufficient documentation

## 2022-11-03 ENCOUNTER — Other Ambulatory Visit: Payer: Self-pay | Admitting: Dermatology

## 2022-11-10 DIAGNOSIS — C50211 Malignant neoplasm of upper-inner quadrant of right female breast: Secondary | ICD-10-CM | POA: Diagnosis not present

## 2022-12-15 ENCOUNTER — Ambulatory Visit: Payer: Medicare PPO | Admitting: Dermatology

## 2022-12-15 DIAGNOSIS — L820 Inflamed seborrheic keratosis: Secondary | ICD-10-CM

## 2022-12-15 DIAGNOSIS — D225 Melanocytic nevi of trunk: Secondary | ICD-10-CM | POA: Diagnosis not present

## 2022-12-15 DIAGNOSIS — L814 Other melanin hyperpigmentation: Secondary | ICD-10-CM

## 2022-12-15 DIAGNOSIS — L821 Other seborrheic keratosis: Secondary | ICD-10-CM

## 2022-12-15 DIAGNOSIS — R202 Paresthesia of skin: Secondary | ICD-10-CM

## 2022-12-15 DIAGNOSIS — B351 Tinea unguium: Secondary | ICD-10-CM | POA: Diagnosis not present

## 2022-12-15 DIAGNOSIS — W908XXA Exposure to other nonionizing radiation, initial encounter: Secondary | ICD-10-CM

## 2022-12-15 DIAGNOSIS — D2271 Melanocytic nevi of right lower limb, including hip: Secondary | ICD-10-CM

## 2022-12-15 DIAGNOSIS — L570 Actinic keratosis: Secondary | ICD-10-CM | POA: Diagnosis not present

## 2022-12-15 DIAGNOSIS — I781 Nevus, non-neoplastic: Secondary | ICD-10-CM

## 2022-12-15 DIAGNOSIS — L603 Nail dystrophy: Secondary | ICD-10-CM

## 2022-12-15 DIAGNOSIS — L578 Other skin changes due to chronic exposure to nonionizing radiation: Secondary | ICD-10-CM | POA: Diagnosis not present

## 2022-12-15 DIAGNOSIS — Z86006 Personal history of melanoma in-situ: Secondary | ICD-10-CM

## 2022-12-15 DIAGNOSIS — D229 Melanocytic nevi, unspecified: Secondary | ICD-10-CM

## 2022-12-15 DIAGNOSIS — L219 Seborrheic dermatitis, unspecified: Secondary | ICD-10-CM

## 2022-12-15 DIAGNOSIS — I8393 Asymptomatic varicose veins of bilateral lower extremities: Secondary | ICD-10-CM

## 2022-12-15 DIAGNOSIS — D2262 Melanocytic nevi of left upper limb, including shoulder: Secondary | ICD-10-CM

## 2022-12-15 DIAGNOSIS — Z1283 Encounter for screening for malignant neoplasm of skin: Secondary | ICD-10-CM | POA: Diagnosis not present

## 2022-12-15 DIAGNOSIS — D1801 Hemangioma of skin and subcutaneous tissue: Secondary | ICD-10-CM

## 2022-12-15 MED ORDER — JUBLIA 10 % EX SOLN: CUTANEOUS | 6 refills | Status: DC

## 2022-12-15 MED ORDER — FLUOCINOLONE ACETONIDE 0.01 % OT OIL: TOPICAL_OIL | OTIC | 11 refills | Status: AC

## 2022-12-15 NOTE — Patient Instructions (Addendum)
Recommend OTC Gold Bond Rapid Relief Anti-Itch cream (pramoxine + menthol), CeraVe Anti-itch cream or lotion (pramoxine), Sarna lotion (Original- menthol + camphor or Sensitive- pramoxine) or Eucerin 12 hour Itch Relief lotion (menthol) up to 3 times per day to areas on body that are itchy.  Recommend applying OTC DermaNail conditioner to cuticle area of fingernails twice daily to help with chipping/breaking.  May take 3-6 months of regular use to get best result.  Could also add OTC Biotin supplement 2.5 mg daily to help strengthen nails.  Recommend mild soap with handwashing followed by a thick moisturizing cream to fingernails.  May use Cutemol cream,which comes with DermaNail, Neutrogena [place] hand cream, or Eucerin cream original.  Cryotherapy Aftercare  Wash gently with soap and water everyday.   Apply Vaseline and Band-Aid daily until healed.    Due to recent changes in healthcare laws, you may see results of your pathology and/or laboratory studies on MyChart before the doctors have had a chance to review them. We understand that in some cases there may be results that are confusing or concerning to you. Please understand that not all results are received at the same time and often the doctors may need to interpret multiple results in order to provide you with the best plan of care or course of treatment. Therefore, we ask that you please give [place] 2 business days to thoroughly review all your results before contacting the office for clarification. Should we see a critical lab result, you will be contacted sooner.   If You Need Anything After Your Visit  If you have any questions or concerns for your doctor, please call our main line at [phone] and press option 4 to reach your doctor's medical assistant. If no one answers, please leave a voicemail as directed and we will return your call as soon as possible. Messages left after 4 pm will be answered the following business day.   You  may also send [place] a message via MyChart. We typically respond to MyChart messages within 1-2 business days.  For prescription refills, please ask your pharmacy to contact our office. Our fax number is [phone].  If you have an urgent issue when the clinic is closed that cannot wait until the next business day, you can page your doctor at the number below.    Please note that while we do our best to be available for urgent issues outside of office hours, we are not available 24/7.   If you have an urgent issue and are unable to reach [place], you may choose to seek medical care at your doctor's office, retail clinic, urgent care center, or emergency room.  If you have a medical emergency, please immediately call 911 or go to the emergency department.  Pager Numbers  - Dr. Gwen Pounds: [phone]  - Dr. Roseanne Reno: [phone]  - Dr. Katrinka Blazing: [phone]   In the event of inclement weather, please call our main line at [phone] for an update on the status of any delays or closures.  Dermatology Medication Tips: Please keep the boxes that topical medications come in in order to help keep track of the instructions about where and how to use these. Pharmacies typically print the medication instructions only on the boxes and not directly on the medication tubes.   If your medication is too expensive, please contact our office at [phone] option 4 or send [place] a message through MyChart.   We are unable to tell what your co-pay for medications will  be in advance as this is different depending on your insurance coverage. However, we may be able to find a substitute medication at lower cost or fill out paperwork to get insurance to cover a needed medication.   If a prior authorization is required to get your medication covered by your insurance company, please allow [place] 1-2 business days to complete this process.  Drug prices often vary depending on where the prescription is filled and some  pharmacies may offer cheaper prices.  The website www.goodrx.com contains coupons for medications through different pharmacies. The prices here do not account for what the cost may be with help from insurance (it may be cheaper with your insurance), but the website can give you the price if you did not use any insurance.  - You can print the associated coupon and take it with your prescription to the pharmacy.  - You may also stop by our office during regular business hours and pick up a GoodRx coupon card.  - If you need your prescription sent electronically to a different pharmacy, notify our office through [facility] or by phone at [phone] option 4.     Si Usted Necesita Algo Despus de Su Visita  Tambin puede enviarnos un mensaje a travs de Clinical cytogeneticist. Por lo general respondemos a los mensajes de MyChart en el transcurso de 1 a 2 das hbiles.  Para renovar recetas, por favor pida a su farmacia que se ponga en contacto con nuestra oficina. Annie Sable de fax es [place] [phone].  Si tiene un asunto urgente cuando la clnica est cerrada y que no puede esperar hasta el siguiente da hbil, puede llamar/localizar a su doctor(a) al nmero que aparece a continuacin.   Por favor, tenga en cuenta que aunque hacemos todo lo posible para estar disponibles para asuntos urgentes fuera del horario de [place], no estamos disponibles las 24 horas del da, los 7 [address]  [address] de la [place].   Si tiene un problema urgente y no puede comunicarse con nosotros, puede optar por buscar atencin mdica  en el consultorio de su doctor(a), en una clnica privada, en un centro de atencin urgente o en una sala de emergencias.  Si tiene Engineer, drilling, por favor llame inmediatamente al 911 o vaya a la sala de emergencias.  Nmeros de bper  - Dr. Gwen Pounds: [phone]  - Dra. Roseanne Reno: [phone]  - Dr. Katrinka Blazing: [phone]   En caso de inclemencias del tiempo, por favor llame a Lacy Duverney principal al [phone] para una actualizacin sobre el [place] de cualquier retraso o cierre.  Consejos para la medicacin en dermatologa: Por favor, guarde las cajas en las que vienen los medicamentos de uso tpico para ayudarle a seguir las instrucciones sobre dnde y cmo usarlos. Las farmacias generalmente imprimen las instrucciones del medicamento slo en las cajas y no directamente en los tubos del [place].   Si su medicamento es muy caro, por favor, pngase en contacto con Rolm Gala llamando al [phone] y presione la opcin 4 o envenos un mensaje a travs de Clinical cytogeneticist.   No podemos decirle cul ser su copago por los medicamentos por adelantado ya que esto es diferente dependiendo de la cobertura de su seguro. Sin embargo, es posible que podamos encontrar un medicamento sustituto a Audiological scientist un formulario para que el seguro cubra el medicamento que se considera necesario.   Si se requiere una autorizacin previa para que su compaa de seguros [place] su medicamento, por favor permtanos de  1 a 2 das hbiles para completar [address]  Los precios de los medicamentos varan con frecuencia dependiendo del Environmental consultant de dnde se surte la receta y alguna farmacias pueden ofrecer precios ms baratos.  El sitio web www.goodrx.com tiene cupones para medicamentos de Health and safety inspector. Los precios aqu no tienen en cuenta lo que podra costar con la ayuda del seguro (puede ser ms barato con su seguro), pero el sitio web puede darle el precio si no utiliz Tourist information centre manager.  - Puede imprimir el cupn correspondiente y llevarlo con su receta a la farmacia.  - Tambin puede pasar por nuestra oficina durante el horario de atencin regular y Education officer, museum una tarjeta de cupones de GoodRx.  - Si necesita que su receta se enve electrnicamente a una farmacia diferente, informe a nuestra oficina a travs de MyChart de [place] o por telfono llamando al [phone] y presione la  opcin 4.

## 2022-12-15 NOTE — Progress Notes (Signed)
Follow-Up Visit   Subjective  Barbara [name] is a 72 y.o. female who presents for the following: Skin Cancer Screening and Full Body Skin Exam  The patient presents for Total-Body Skin Exam (TBSE) for skin cancer screening and mole check. The patient has spots, moles and lesions to be evaluated, some may be new or changing. 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 Onychomycosis of the right toenails improved with fluconazole, 2 pills left.    The following portions of the chart were reviewed this encounter and updated as appropriate: medications, allergies, medical history  Review of Systems:  No other skin or systemic complaints except as noted in HPI or Assessment and Plan.  Objective  Well appearing patient in no apparent distress; mood and affect are within normal limits.  A full examination was performed including scalp, head, eyes, ears, nose, lips, neck, chest, axillae, abdomen, back, buttocks, bilateral upper extremities, bilateral lower extremities, hands, feet, fingers, toes, fingernails, and toenails. All findings within normal limits unless otherwise noted below.   Relevant physical exam findings are noted in the Assessment and Plan.  Right Upper Eyebrow x 1 Pink scaly macule.  crown scalp x 1 Erythematous stuck-on, waxy papule    Assessment & Plan   SKIN CANCER SCREENING PERFORMED TODAY.  ACTINIC DAMAGE - Chronic condition, secondary to cumulative UV/sun exposure - diffuse scaly erythematous macules with underlying dyspigmentation - Recommend daily broad spectrum sunscreen SPF 30+ to sun-exposed areas, reapply every 2 hours as needed.  - Staying in the shade or wearing long sleeves, sun glasses (UVA+UVB protection) and wide brim hats (4-inch brim around the entire circumference of the hat) are also recommended for sun protection.  - Call for new or changing lesions.  LENTIGINES, SEBORRHEIC KERATOSES, HEMANGIOMAS - Benign normal skin lesions -  Benign-appearing - Call for any changes  SEBORRHEIC KERATOSIS - 2 mm med dark brown waxy macule at right hand dorsum - Benign-appearing - Discussed benign etiology and prognosis. - Observe - Call for any changes  MELANOCYTIC NEVI - Tan-brown and/or pink-flesh-colored symmetric macules and papules - L post upper arm - 4.17mm 2 tone brown pap with emerging hair - R med pretibia - 3.17mm brown macule - R mid back - 2.29mm med brown macule - Benign appearing on exam today - Observation - Call clinic for new or changing moles - Recommend daily use of broad spectrum spf 30+ sunscreen to sun-exposed areas.   HISTORY OF MELANOMA IN SITU Right lateral mid back, exc 09/18/2020 - No evidence of recurrence today - Recommend regular full body skin exams - Recommend daily broad spectrum sunscreen SPF 30+ to sun-exposed areas, reapply every 2 hours as needed.  - Call if any new or changing lesions are noted between office visits  SEBORRHEIC DERMATITIS Exam: Pink scaliness of the ear canals.  Chronic and persistent condition with duration or expected duration over one year. Condition is improving with treatment but not currently at goal.  Still itching in ears.   Seborrheic Dermatitis is a chronic persistent rash characterized by pinkness and scaling most commonly of the mid face but also can occur on the scalp (dandruff), ears; mid chest, mid back and groin.  It tends to be exacerbated by stress and cooler weather.  People who have neurologic disease may experience new onset or exacerbation of existing seborrheic dermatitis.  The condition is not curable but treatable and can be controlled.  Treatment Plan: Cont Mometasone cr qd prn flares  Start Dermotic oil every day/bid to ear canals prn itch   Topical steroids (such as triamcinolone, fluocinolone, fluocinonide, mometasone, clobetasol, halobetasol, betamethasone, hydrocortisone) can cause thinning and lightening of the skin if they are used  for too long in the same area. Your physician has selected the right strength medicine for your problem and area affected on the body. Please use your medication only as directed by your physician to prevent side effects.      ONYCHOMYCOSIS Exam: toenails clear  Chronic condition with duration or expected duration over one year. Currently much improved with ~10 mos weekly PO fluconazole    Treatment Plan:  Much improved compared to baseline photos.  Finish remaining 2 pills Fluconazole 200 MG po qwk. Start Jublia solution Apply at bedtime to toenails dsp 8mL 6Rf.  Side effects of fluconazole (diflucan) include nausea, diarrhea, headache, dizziness, taste changes, rare risk of irritation of the liver, allergy, or decreased blood counts (which could show up as infection or tiredness).  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  May take 3-6 months of regular use to get best result.  Could also add OTC Biotin supplement 2.5 mg daily to help strengthen nails.  Recommend mild soap with handwashing followed by a thick moisturizing cream to fingernails.  May use Cutemol cream,which comes with DermaNail, Neutrogena [place] hand cream, or Eucerin cream original.  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 Condition is improving with treatment but not currently at goal.   Chronic condition without cure secondary to pinched nerve along spine causing itching or sensation changes in an area of skin. Chronic rubbing or scratching causes darkening of the skin.  OTC treatments which can help with itch include numbing creams like pramoxine or lidocaine which  temporarily reduce itch or Capsaicin-containing creams which cause a burning sensation but which sometimes over time will reset the nerves to stop producing itch.  If you choose to use Capsaicin cream, it is recommended to use it 5 times daily for 1 week followed by 3 times daily for 3-6 weeks. You may have to continue using it long-term.  If not doing well with OTC options, could consider [facility] [employer] compounded prescription anti-itch cream with Amitriptyline 5% / Lidocaine 5% / Pramoxine 1% or Amitriptyline 5% / Gabapentin 10% / Lidocaine 5% Cream or other prescription cream or pill options.   May continue clobetasol cream or mometasone cream apply 1-2 times a day as needed for itch. Avoid applying to face, groin, and axilla. Use as directed. Long-term use can cause thinning of the skin. Will call for Rx.  Varicose Veins/Spider Veins - Dilated blue, purple or red veins at the lower extremities - Reassured - Smaller vessels can be treated by sclerotherapy (a procedure to inject a medicine into the veins to make them disappear) if desired, but the treatment is not covered by insurance. Larger vessels may be covered if symptomatic and we would refer to vascular surgeon if treatment desired.   AK (actinic keratosis) Right Upper Eyebrow x 1  Actinic keratoses are precancerous spots that appear secondary to cumulative UV radiation exposure/sun exposure over time. They are chronic with expected duration over 1 year. A portion of actinic keratoses will  progress to squamous cell carcinoma of the skin. It is not possible to reliably predict which spots will progress to skin cancer and so treatment is recommended to prevent development of skin cancer.  Recommend daily broad spectrum sunscreen SPF 30+ to sun-exposed areas, reapply every 2 hours as needed.  Recommend staying in the shade or wearing long sleeves, sun glasses (UVA+UVB protection) and wide brim hats (4-inch brim around the entire  circumference of the hat). Call for new or changing lesions.  Destruction of lesion - Right Upper Eyebrow x 1  Destruction method: cryotherapy   Informed consent: discussed and consent obtained   Lesion destroyed using liquid nitrogen: Yes   Region frozen until ice ball extended beyond lesion: Yes   Outcome: patient tolerated procedure well with no complications   Post-procedure details: wound care instructions given   Additional details:  Prior to procedure, discussed risks of blister formation, small wound, skin dyspigmentation, or rare scar following cryotherapy. Recommend Vaseline ointment to treated areas while healing.   Inflamed seborrheic keratosis crown scalp x 1  Symptomatic, irritating, patient would like treated.  Destruction of lesion - crown scalp x 1  Destruction method: cryotherapy   Informed consent: discussed and consent obtained   Lesion destroyed using liquid nitrogen: Yes   Region frozen until ice ball extended beyond lesion: Yes   Outcome: patient tolerated procedure well with no complications   Post-procedure details: wound care instructions given   Additional details:  Prior to procedure, discussed risks of blister formation, small wound, skin dyspigmentation, or rare scar following cryotherapy. Recommend Vaseline ointment to treated areas while healing.    Return in about 6 months (around 06/14/2023) for TBSE, Hx melanoma.  ICherlyn Labella, CMA, am acting as scribe for Willeen Niece, MD .   Documentation: I have reviewed the above documentation for accuracy and completeness, and I agree with the above.  Willeen Niece, MD

## 2022-12-17 ENCOUNTER — Other Ambulatory Visit: Payer: Self-pay

## 2022-12-17 MED ORDER — JUBLIA 10 % EX SOLN: CUTANEOUS | 11 refills | Status: DC

## 2022-12-17 NOTE — Progress Notes (Signed)
Per insurance 8 mL dose not covered due to exceeding the quantity limits. Medication only comes in 4mL and 8 mL so 4 mL sent instead.

## 2023-02-23 DIAGNOSIS — I129 Hypertensive chronic kidney disease with stage 1 through stage 4 chronic kidney disease, or unspecified chronic kidney disease: Secondary | ICD-10-CM | POA: Diagnosis not present

## 2023-02-23 DIAGNOSIS — N1831 Chronic kidney disease, stage 3a: Secondary | ICD-10-CM | POA: Diagnosis not present

## 2023-02-23 DIAGNOSIS — E7800 Pure hypercholesterolemia, unspecified: Secondary | ICD-10-CM | POA: Diagnosis not present

## 2023-02-23 DIAGNOSIS — E1122 Type 2 diabetes mellitus with diabetic chronic kidney disease: Secondary | ICD-10-CM | POA: Diagnosis not present

## 2023-02-23 DIAGNOSIS — E11620 Type 2 diabetes mellitus with diabetic dermatitis: Secondary | ICD-10-CM | POA: Diagnosis not present

## 2023-02-23 DIAGNOSIS — N393 Stress incontinence (female) (male): Secondary | ICD-10-CM | POA: Diagnosis not present

## 2023-02-23 DIAGNOSIS — J302 Other seasonal allergic rhinitis: Secondary | ICD-10-CM | POA: Diagnosis not present

## 2023-02-23 DIAGNOSIS — K219 Gastro-esophageal reflux disease without esophagitis: Secondary | ICD-10-CM | POA: Diagnosis not present

## 2023-02-23 DIAGNOSIS — G4733 Obstructive sleep apnea (adult) (pediatric): Secondary | ICD-10-CM | POA: Diagnosis not present

## 2023-02-24 ENCOUNTER — Ambulatory Visit: Payer: Medicare PPO | Admitting: Radiation Oncology

## 2023-03-03 DIAGNOSIS — K219 Gastro-esophageal reflux disease without esophagitis: Secondary | ICD-10-CM | POA: Diagnosis not present

## 2023-03-03 DIAGNOSIS — R1310 Dysphagia, unspecified: Secondary | ICD-10-CM | POA: Diagnosis not present

## 2023-03-03 DIAGNOSIS — Z800 Family history of malignant neoplasm of digestive organs: Secondary | ICD-10-CM | POA: Diagnosis not present

## 2023-03-03 DIAGNOSIS — R053 Chronic cough: Secondary | ICD-10-CM | POA: Diagnosis not present

## 2023-03-03 DIAGNOSIS — R0989 Other specified symptoms and signs involving the circulatory and respiratory systems: Secondary | ICD-10-CM | POA: Diagnosis not present

## 2023-03-04 ENCOUNTER — Ambulatory Visit
Admission: RE | Admit: 2023-03-04 | Discharge: 2023-03-04 | Disposition: A | Discharge status: Home / Self Care | Payer: Medicare PPO | Source: Ambulatory Visit | Attending: Radiation Oncology | Admitting: Radiation Oncology

## 2023-03-04 ENCOUNTER — Encounter: Payer: Self-pay | Admitting: Radiation Oncology

## 2023-03-04 VITALS — BP 114/66 | HR 76 | Temp 98.6°F | Resp 19 | Wt 197.7 lb

## 2023-03-04 DIAGNOSIS — Z170 Estrogen receptor positive status [ER+]: Secondary | ICD-10-CM | POA: Diagnosis not present

## 2023-03-04 DIAGNOSIS — Z79811 Long term (current) use of aromatase inhibitors: Secondary | ICD-10-CM | POA: Diagnosis not present

## 2023-03-04 DIAGNOSIS — C50211 Malignant neoplasm of upper-inner quadrant of right female breast: Secondary | ICD-10-CM | POA: Insufficient documentation

## 2023-03-04 DIAGNOSIS — Z923 Personal history of irradiation: Secondary | ICD-10-CM | POA: Insufficient documentation

## 2023-03-04 NOTE — Progress Notes (Signed)
Radiation Oncology Follow up Note  Name: Barbara [name]   Date:   03/04/2023 MRN:  [id number] DOB: 01-01-1951    This 73 y.o. female presents to the clinic today for 3-year follow-up status post whole breast radiation to her right breast for stage Ia ER/PR positive invasive mammary carcinoma.  REFERRING PROVIDER: Marina Goodell, MD  HPI: Patient is a 73 year old female now out over 3 years having completed whole breast radiation to her right breast for stage Ia ER/PR positive invasive mammary carcinoma seen today in routine follow-up she is doing well.  She specifically denies breast tenderness cough or bone pain..  She had breast imaging back in September which I have reviewed was BI-RADS 2 benign.  She is currently on Arimidex tolerating it well without side effect.  COMPLICATIONS OF TREATMENT: none  FOLLOW UP COMPLIANCE: keeps appointments   PHYSICAL EXAM:  BP 114/66   Pulse 76   Temp 98.6 F (37 C)   Resp 19   Wt 197 lb 11.2 oz (89.7 kg)   SpO2 97%   BMI 32.90 kg/m  Lungs are clear to A&P cardiac examination essentially unremarkable with regular rate and rhythm. No dominant mass or nodularity is noted in either breast in 2 positions examined. Incision is well-healed. No axillary or supraclavicular adenopathy is appreciated. Cosmetic result is excellent.  Well-developed well-nourished patient in NAD. HEENT reveals PERLA, EOMI, discs not visualized.  Oral cavity is clear. No oral mucosal lesions are identified. Neck is clear without evidence of cervical or supraclavicular adenopathy. Lungs are clear to A&P. Cardiac examination is essentially unremarkable with regular rate and rhythm without murmur rub or thrill. Abdomen is benign with no organomegaly or masses noted. Motor sensory and DTR levels are equal and symmetric in the upper and lower extremities. Cranial nerves II through XII are grossly intact. Proprioception is intact. No peripheral adenopathy or edema is identified. No  motor or sensory levels are noted. Crude visual fields are within normal range.  RADIOLOGY RESULTS: Mammograms reviewed compatible with above findings  PLAN: Present time patient is doing well with no evidence of disease of her breast now out over 3 years.  I am going to turn follow-up care over to medical oncology.  Be happy to reevaluate the patient in time should that be indicated.  Patient knows to call with any concerns.  I would like to take this opportunity to thank you for allowing me to participate in the care of your patient.Carmina Miller, MD

## 2023-03-10 ENCOUNTER — Other Ambulatory Visit: Payer: Self-pay | Admitting: Nurse Practitioner

## 2023-03-10 DIAGNOSIS — R0989 Other specified symptoms and signs involving the circulatory and respiratory systems: Secondary | ICD-10-CM

## 2023-03-10 DIAGNOSIS — K219 Gastro-esophageal reflux disease without esophagitis: Secondary | ICD-10-CM

## 2023-03-10 DIAGNOSIS — Z800 Family history of malignant neoplasm of digestive organs: Secondary | ICD-10-CM

## 2023-03-10 DIAGNOSIS — R1310 Dysphagia, unspecified: Secondary | ICD-10-CM

## 2023-03-12 ENCOUNTER — Ambulatory Visit
Admission: RE | Admit: 2023-03-12 | Discharge: 2023-03-12 | Disposition: A | Discharge status: Home / Self Care | Payer: Medicare PPO | Source: Ambulatory Visit | Attending: Nurse Practitioner | Admitting: Nurse Practitioner

## 2023-03-12 DIAGNOSIS — R0989 Other specified symptoms and signs involving the circulatory and respiratory systems: Secondary | ICD-10-CM | POA: Insufficient documentation

## 2023-03-12 DIAGNOSIS — K219 Gastro-esophageal reflux disease without esophagitis: Secondary | ICD-10-CM | POA: Diagnosis not present

## 2023-03-12 DIAGNOSIS — K222 Esophageal obstruction: Secondary | ICD-10-CM | POA: Diagnosis not present

## 2023-03-12 DIAGNOSIS — R1310 Dysphagia, unspecified: Secondary | ICD-10-CM | POA: Diagnosis not present

## 2023-03-12 DIAGNOSIS — Z800 Family history of malignant neoplasm of digestive organs: Secondary | ICD-10-CM | POA: Insufficient documentation

## 2023-03-12 DIAGNOSIS — K224 Dyskinesia of esophagus: Secondary | ICD-10-CM | POA: Insufficient documentation

## 2023-03-12 DIAGNOSIS — K449 Diaphragmatic hernia without obstruction or gangrene: Secondary | ICD-10-CM | POA: Diagnosis not present

## 2023-03-22 ENCOUNTER — Telehealth: Payer: Self-pay | Admitting: Oncology

## 2023-03-22 DIAGNOSIS — K219 Gastro-esophageal reflux disease without esophagitis: Secondary | ICD-10-CM | POA: Diagnosis not present

## 2023-03-22 DIAGNOSIS — J310 Chronic rhinitis: Secondary | ICD-10-CM | POA: Diagnosis not present

## 2023-03-22 NOTE — Telephone Encounter (Signed)
Per secure chat, due to weather on wed. 03/24/23 appts need to be r/s.   I called pt and left a vm detailing this and to have her call us back to r/s the appts.

## 2023-03-24 ENCOUNTER — Inpatient Hospital Stay: Payer: Medicare PPO | Admitting: Oncology

## 2023-03-24 ENCOUNTER — Inpatient Hospital Stay: Payer: Medicare PPO

## 2023-03-30 ENCOUNTER — Inpatient Hospital Stay: Payer: Medicare PPO

## 2023-03-30 ENCOUNTER — Inpatient Hospital Stay (HOSPITAL_BASED_OUTPATIENT_CLINIC_OR_DEPARTMENT_OTHER): Payer: Medicare PPO | Admitting: Oncology

## 2023-03-30 ENCOUNTER — Encounter: Payer: Self-pay | Admitting: Oncology

## 2023-03-30 ENCOUNTER — Inpatient Hospital Stay: Payer: Medicare PPO | Attending: Oncology

## 2023-03-30 VITALS — BP 128/62 | HR 66 | Resp 18

## 2023-03-30 VITALS — BP 132/62 | HR 71 | Temp 96.6°F | Resp 18 | Wt 201.3 lb

## 2023-03-30 DIAGNOSIS — Z801 Family history of malignant neoplasm of trachea, bronchus and lung: Secondary | ICD-10-CM | POA: Diagnosis not present

## 2023-03-30 DIAGNOSIS — N6022 Fibroadenosis of left breast: Secondary | ICD-10-CM | POA: Insufficient documentation

## 2023-03-30 DIAGNOSIS — K219 Gastro-esophageal reflux disease without esophagitis: Secondary | ICD-10-CM | POA: Insufficient documentation

## 2023-03-30 DIAGNOSIS — Z85820 Personal history of malignant melanoma of skin: Secondary | ICD-10-CM | POA: Diagnosis not present

## 2023-03-30 DIAGNOSIS — N183 Chronic kidney disease, stage 3 unspecified: Secondary | ICD-10-CM | POA: Insufficient documentation

## 2023-03-30 DIAGNOSIS — Z9049 Acquired absence of other specified parts of digestive tract: Secondary | ICD-10-CM | POA: Diagnosis not present

## 2023-03-30 DIAGNOSIS — C50211 Malignant neoplasm of upper-inner quadrant of right female breast: Secondary | ICD-10-CM | POA: Diagnosis not present

## 2023-03-30 DIAGNOSIS — Z79899 Other long term (current) drug therapy: Secondary | ICD-10-CM | POA: Diagnosis not present

## 2023-03-30 DIAGNOSIS — Z1732 Human epidermal growth factor receptor 2 negative status: Secondary | ICD-10-CM | POA: Diagnosis not present

## 2023-03-30 DIAGNOSIS — K224 Dyskinesia of esophagus: Secondary | ICD-10-CM | POA: Diagnosis not present

## 2023-03-30 DIAGNOSIS — Z79811 Long term (current) use of aromatase inhibitors: Secondary | ICD-10-CM

## 2023-03-30 DIAGNOSIS — M8580 Other specified disorders of bone density and structure, unspecified site: Secondary | ICD-10-CM | POA: Insufficient documentation

## 2023-03-30 DIAGNOSIS — Z170 Estrogen receptor positive status [ER+]: Secondary | ICD-10-CM | POA: Insufficient documentation

## 2023-03-30 DIAGNOSIS — Z803 Family history of malignant neoplasm of breast: Secondary | ICD-10-CM | POA: Insufficient documentation

## 2023-03-30 DIAGNOSIS — K449 Diaphragmatic hernia without obstruction or gangrene: Secondary | ICD-10-CM | POA: Diagnosis not present

## 2023-03-30 DIAGNOSIS — Z1721 Progesterone receptor positive status: Secondary | ICD-10-CM | POA: Insufficient documentation

## 2023-03-30 DIAGNOSIS — N1831 Chronic kidney disease, stage 3a: Secondary | ICD-10-CM

## 2023-03-30 DIAGNOSIS — G4730 Sleep apnea, unspecified: Secondary | ICD-10-CM | POA: Diagnosis not present

## 2023-03-30 DIAGNOSIS — K222 Esophageal obstruction: Secondary | ICD-10-CM | POA: Diagnosis not present

## 2023-03-30 DIAGNOSIS — Z1509 Genetic susceptibility to other malignant neoplasm: Secondary | ICD-10-CM | POA: Insufficient documentation

## 2023-03-30 DIAGNOSIS — I129 Hypertensive chronic kidney disease with stage 1 through stage 4 chronic kidney disease, or unspecified chronic kidney disease: Secondary | ICD-10-CM | POA: Diagnosis not present

## 2023-03-30 DIAGNOSIS — Z800 Family history of malignant neoplasm of digestive organs: Secondary | ICD-10-CM | POA: Insufficient documentation

## 2023-03-30 LAB — CBC WITH DIFFERENTIAL (CANCER CENTER ONLY)
Abs Immature Granulocytes: 0.08 10*3/uL — ABNORMAL HIGH (ref 0.00–0.07)
Basophils Absolute: 0 10*3/uL (ref 0.0–0.1)
Basophils Relative: 1 %
Eosinophils Absolute: 0.2 10*3/uL (ref 0.0–0.5)
Eosinophils Relative: 3 %
HCT: 40.1 % (ref 36.0–46.0)
Hemoglobin: 12.9 g/dL (ref 12.0–15.0)
Immature Granulocytes: 1 %
Lymphocytes Relative: 21 %
Lymphs Abs: 1.3 10*3/uL (ref 0.7–4.0)
MCH: 26.7 pg (ref 26.0–34.0)
MCHC: 32.2 g/dL (ref 30.0–36.0)
MCV: 83 fL (ref 80.0–100.0)
Monocytes Absolute: 0.4 10*3/uL (ref 0.1–1.0)
Monocytes Relative: 6 %
Neutro Abs: 4.1 10*3/uL (ref 1.7–7.7)
Neutrophils Relative %: 68 %
Platelet Count: 183 10*3/uL (ref 150–400)
RBC: 4.83 MIL/uL (ref 3.87–5.11)
RDW: 13.8 % (ref 11.5–15.5)
WBC Count: 6.1 10*3/uL (ref 4.0–10.5)
nRBC: 0 % (ref 0.0–0.2)

## 2023-03-30 LAB — CMP (CANCER CENTER ONLY)
ALT: 13 U/L (ref 0–44)
AST: 14 U/L — ABNORMAL LOW (ref 15–41)
Albumin: 4 g/dL (ref 3.5–5.0)
Alkaline Phosphatase: 64 U/L (ref 38–126)
Anion gap: 8 (ref 5–15)
BUN: 15 mg/dL (ref 8–23)
CO2: 26 mmol/L (ref 22–32)
Calcium: 9.4 mg/dL (ref 8.9–10.3)
Chloride: 106 mmol/L (ref 98–111)
Creatinine: 1.05 mg/dL — ABNORMAL HIGH (ref 0.44–1.00)
GFR, Estimated: 56 mL/min — ABNORMAL LOW (ref >60)
Glucose, Bld: 107 mg/dL — ABNORMAL HIGH (ref 70–99)
Potassium: 4.1 mmol/L (ref 3.5–5.1)
Sodium: 140 mmol/L (ref 135–145)
Total Bilirubin: 1.2 mg/dL (ref 0.0–1.2)
Total Protein: 6.9 g/dL (ref 6.5–8.1)

## 2023-03-30 MED ORDER — SODIUM CHLORIDE 0.9 % IV SOLN
INTRAVENOUS | Status: DC
  Filled 2023-03-30: qty 250

## 2023-03-30 MED ORDER — ZOLEDRONIC ACID 4 MG/5ML IV CONC
3.3000 mg | Freq: Once | INTRAVENOUS | Status: AC
  Administered 2023-03-30: 3.3 mg via INTRAVENOUS
  Filled 2023-03-30: qty 4.13

## 2023-03-30 NOTE — Progress Notes (Signed)
 Hematology/Oncology Progress note [phone] [phone]     CHIEF COMPLAINTS/REASON FOR VISIT:  Follow up for right breast cancer  ASSESSMENT & PLAN:   Malignant neoplasm of upper-inner quadrant of right breast in female, estrogen receptor positive (HCC) #Stage IA right breast cancer, s/p lumpectomy/SLNB and radiation. Labs reviewed and discussed with patient. Continue Arimidex 1 mg daily. Plan total 5 years of endocrine therapy-till January 2027 Continue annual mammogram - she gets mammogram ordered through Dr.Cintron's office.next due Sept 2025   Aromatase inhibitor use 12/29/21 DEXA showed osteopenia 10-year major osteoporotic fracture right is 15 %. Continue calcium and vitamin D supplementation Proceed with Zometa today- 3.3mg , renal dosing Repeat DEXA in Nov/Dec 2025  CKD (chronic kidney disease) stage 3, GFR 30-59 ml/min (HCC) Encourage oral hydration and avoid nephrotoxins.    Orders Placed This Encounter  Procedures   CMP (Cancer Center only)    Standing Status:   Future    Expected Date:   09/27/2023    Expiration Date:   03/29/2024   CBC with Differential (Cancer Center Only)    Standing Status:   Future    Expected Date:   09/27/2023    Expiration Date:   03/29/2024   Follow up in 6 months.  All questions were answered. The patient knows to call the clinic with any problems, questions or concerns.  Rickard Patience, MD, PhD [facility] Hematology Oncology 03/30/2023   HISTORY OF PRESENTING ILLNESS:   Barbara [name] is a  73 y.o.  female presents for follow up of breast cancer  Oncology History  Malignant neoplasm of upper-inner quadrant of right breast in female, estrogen receptor positive (HCC)  10/13/2019 Mammogram   Bilateral Diagnostic mammogram  Showed 5mm spiculated mass in the right breast 1:00. Unchanged left axillary probabaly benign mass- 1 year stability. 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 ER 90% positive, PR 1-0% positive, HER2 negative.    Family history of breast cancer: Emelda Brothers, and Dennie Bible first cousin Family history of other cancers: lung cancer in mother, pancreatic cancer in father, unknown cancer in pat uncle.  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 VUS in BRCA2 called c.2980G>A identified on the [facility] Multi-Cancer Panel. The report date is 11/17/2019.  The Multi-Cancer Panel offered by [facility] includes sequencing and/or deletion duplication testing of the following 85 genes: AIP, ALK, APC, ATM, AXIN2,BAP1,  BARD1, BLM, BMPR1A, BRCA1, BRCA2, BRIP1, CASR, CDC73, CDH1, CDK4, CDKN1B, CDKN1C, CDKN2A (p14ARF), CDKN2A (p16INK4a), CEBPA, CHEK2, CTNNA1, DICER1, DIS3L2, EGFR (c.2369C>T, p.Thr790Met variant only), EPCAM (Deletion/duplication testing only), FH, FLCN, GATA2, GPC3, GREM1 (Promoter region deletion/duplication testing only), HOXB13 (  c.251G>A, p.Gly84Glu), HRAS, KIT, MAX, MEN1, MET, MITF (c.952G>A, p.Glu318Lys variant only), MLH1, MSH2, MSH3, MSH6, MUTYH, NBN, NF1, NF2, NTHL1, PALB2, PDGFRA, PHOX2B, PMS2,  POLD1, POLE, POT1, PRKAR1A, PTCH1, PTEN, RAD50, RAD51C, RAD51D, RB1, RECQL4, RET, RNF43, RUNX1, SDHAF2, SDHA (sequence changes only), SDHB, SDHC, SDHD, SMAD4, SMARCA4, SMARCB1, SMARCE1, STK11, SUFU, TERC, TERT, TMEM127, TP53, TSC1, TSC2, VHL, WRN and WT1.    10/30/2019 Surgery   patient underwent right lumpectomy and sentinel lymph node biopsy.  Stage IA right ER 90%/PR 1-10% positive, HER-2 negative right breast invasive mammary carcinoma, pT1a pN0 tubular carcinoma, grade 1.    01/18/2020 -  Radiation Therapy   finished adjuvant Breast radiation.    02/2020 -  Anti-estrogen oral therapy   started on Arimidex    10/30/2020 Mammogram   bilateral diagnostic mammogram showed a subtle left breast architectural distortion without suspicious sonographic correlate.   -11/06/2020, left breast upper inner stereotactic biopsy showed benign breast tissue with fibrocystic changes with ductal ectasia. Psudoangiomatous stromal hyperplasia. usual ductal hyperplasia. Negative for atypia and malignancy. Left breast upper inner posterior stereotactic biopsy showed benign breast tissue with sclerosing adenosis. Fibrocystic changes. Negative for atypia and malignancy     10/31/2021 Mammogram   Bilateral diagnostic mammogram  1. There is a 9 mm group of indeterminate amorphous calcifications in the LEFT upper outer breast at posterior depth. Recommend stereotactic guided biopsy for definitive characterization. 2. No mammographic evidence of malignancy in the RIGHT breast.  11/12/21 Left upper outer breast biopsy showed benign fibrofatty breast parenchyma with fibrocystic and fibroadenomatoid changes, and associated coarse dystrophic calcifications. Negative for atypical proliferative breast disease    12/29/2021 Imaging   DEXA showed osteopenia FRAX major osteoporotic fracture is 15.8% within the next ten years.      INTERVAL HISTORY Barbara [name] is a 73 y.o. female who has above history reviewed by me  today presents for follow up visit for management of right breast cancer. Patient reports feeling well.  She has no new complaints. Patient tolerates Arimidex with manageable side effects.  She is on Zometa every 6 months. She denies any new breast concerns.   Review of Systems  Constitutional:  Negative for appetite change, chills, fatigue and fever.  HENT:   Negative for hearing loss and voice change.   Eyes:  Negative for eye problems.  Respiratory:  Negative for chest tightness and cough.   Cardiovascular:  Negative for chest pain.  Gastrointestinal:  Negative for abdominal distention, abdominal pain and blood in stool.  Endocrine: Negative for hot flashes.  Genitourinary:  Negative for difficulty urinating and frequency.   Musculoskeletal:  Negative for arthralgias.  Skin:  Negative for itching and rash.  Neurological:  Negative for extremity weakness.  Hematological:  Negative for adenopathy.  Psychiatric/Behavioral:  Negative for confusion.     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, R lat mid back, exc 09/18/20   Osteopenia 02/15/2020   Personal history of radiation therapy    Pre-diabetes    Sleep apnea    uses cpap    SURGICAL HISTORY:  Past Surgical History:  Procedure Laterality Date   APPENDECTOMY     BREAST BIOPSY Right 10/20/2019   Affirm bx-"X" clip positive   BREAST BIOPSY Left 11/06/2020   stereo bx, x clip, benign   BREAST BIOPSY Left 11/06/2020   Stereo bx-Distortion #2-"Coil" clip-benign   BREAST BIOPSY Left 11/12/2021   stereo bx/ ribbon clip/ benign   BREAST CYST EXCISION Right    BREAST CYST EXCISION  Right    BREAST CYST EXCISION Left    BREAST LUMPECTOMY Right    BREAST SURGERY     CHOLECYSTECTOMY     PART MASTECTOMY,RADIO FREQUENCY LOCALIZER,AXILLARY SENTINEL NODE BIOPSY Right 10/30/2019   Procedure: PART MASTECTOMY,RADIO FREQUENCY LOCALIZER,AXILLARY SENTINEL NODE BIOPSY;  Surgeon: Carolan Shiver, MD;  Location: [facility] ORS;  Service: General;  Laterality: Right;    SOCIAL HISTORY: Social History   Socioeconomic History   Marital status: Married    Spouse name: Not on file   Number of children: Not on file   Years of education: Not on file   Highest education level: Not on file  Occupational History   Not on file  Tobacco Use   Smoking status: Never   Smokeless tobacco: Never  Vaping Use   Vaping status: Never Used  Substance and Sexual Activity   Alcohol use: No   Drug use: No   Sexual activity: Not on file  Other Topics Concern   Not on file  Social History Narrative   Not on file   Social Drivers of Health   Financial Resource Strain: Low Risk  (07/30/2022)   Received from [facility]   Overall Financial Resource Strain (CARDIA)    Difficulty of Paying Living Expenses: Not hard at all  Food Insecurity: No Food Insecurity (07/30/2022)   Received from [facility]   Hunger Vital Sign    Worried About Running Out of Food in the Last Year: Never true    Ran Out of Food in the Last Year: Never true  Transportation Needs: No Transportation Needs (07/30/2022)   Received from [facility] - Transportation    In the past 12 months, has lack of transportation kept you from medical appointments or from getting medications?: No    Lack of Transportation (Non-Medical): No  Physical Activity: Not on file  Stress: Not on file  Social Connections: Not on file  Intimate Partner Violence: Not on file    FAMILY HISTORY: Family History  Problem Relation Age of Onset   Lung cancer Mother    Pancreatic  cancer Father    Breast cancer Daughter    Breast cancer Maternal Aunt    Breast cancer Paternal Aunt    Cancer Paternal Aunt        unk type, possibly breast   Cancer Paternal Uncle        unk type   Breast cancer Cousin    Cancer Cousin        unk type    ALLERGIES:  has no known allergies.  MEDICATIONS:  Current Outpatient Medications  Medication Sig Dispense Refill   amLODipine (NORVASC) 5 MG tablet Take 5 mg by mouth at bedtime.      anastrozole (ARIMIDEX) 1 MG tablet Take 1 tablet (1 mg total) by mouth daily. 90 tablet 3   aspirin 81 MG EC tablet Take 81 mg by mouth daily.      azelastine (ASTELIN) 0.1 % nasal spray Place into the nose.  Biotin 69629 MCG TABS Take 1,000 tablets by mouth 2 (two) times daily.     calcipotriene-betamethasone (TACLONEX) external suspension Apply to affected area ears at night until improved. 60 g 1   Calcium Carbonate-Vit D-Min (CALCIUM 1200 PO) Take by mouth.     cetirizine (ZYRTEC) 10 MG tablet Take 10 mg by mouth every morning.      Cholecalciferol (VITAMIN D) 50 MCG (2000 UT) CAPS Take 2,000 Units by mouth daily.     CINNAMON PO Take 1,000 mg by mouth in the morning and at bedtime.      clobetasol cream (TEMOVATE) 0.05 % Apply 1 application topically 2 (two) times daily. 45 g 1   Efinaconazole (JUBLIA) 10 % SOLN Apply a thin coat to the nails QHS. 4 mL 11   fluconazole (DIFLUCAN) 200 MG tablet Take 1 tablet (200 mg total) by mouth daily. 15 tablet 0   Fluocinolone Acetonide 0.01 % OIL Apply 1-2 times a day as needed to ear canals. 20 mL 11   Krill Oil 500 MG CAPS Take 500 mg by mouth daily.     lisinopril (ZESTRIL) 2.5 MG tablet Take 2.5 mg by mouth daily.     lovastatin (MEVACOR) 40 MG tablet Take 40 mg by mouth at bedtime.      MAGNESIUM GLYCINATE PO Take 200 mg by mouth.     meloxicam (MOBIC) 15 MG tablet Take 1 tablet by mouth daily.     mometasone (ELOCON) 0.1 % cream APPLY TO THE AFFECTED AREAS OF EARS DAILY AS NEEDED FOR RASH FOR  FLARES 45 g 0   pantoprazole (PROTONIX) 40 MG tablet Take 40 mg by mouth every morning.      No current facility-administered medications for this visit.     PHYSICAL EXAMINATION: ECOG PERFORMANCE STATUS: 0 - Asymptomatic Vitals:   03/30/23 1114  BP: 132/62  Pulse: 71  Resp: 18  Temp: (!) 96.6 F (35.9 C)  SpO2: 99%   Filed Weights   03/30/23 1114  Weight: 201 lb 4.8 oz (91.3 kg)    Physical Exam Constitutional:      General: She is not in acute distress. HENT:     Head: Normocephalic and atraumatic.  Eyes:     General: No scleral icterus. Cardiovascular:     Rate and Rhythm: Normal rate and regular rhythm.     Heart sounds: Normal heart sounds.  Pulmonary:     Effort: Pulmonary effort is normal. No respiratory distress.     Breath sounds: No wheezing.  Abdominal:     General: Bowel sounds are normal. There is no distension.     Palpations: Abdomen is soft.  Musculoskeletal:        General: No deformity. Normal range of motion.     Cervical back: Normal range of motion and neck supple.  Skin:    General: Skin is warm and dry.     Findings: No erythema or rash.  Neurological:     Mental Status: She is alert and oriented to person, place, and time. Mental status is at baseline.     Cranial Nerves: No cranial nerve deficit.     Coordination: Coordination normal.  Psychiatric:        Mood and Affect: Mood normal.      LABORATORY DATA:  I have reviewed the data as listed     Latest Ref Rng & Units 03/30/2023   11:06 AM 09/23/2022   12:55 PM 03/25/2022    1:03 PM  CBC  WBC 4.0 - 10.5 K/uL 6.1  7.4  5.6   Hemoglobin 12.0 - 15.0 g/dL 16.1  09.6  04.5   Hematocrit 36.0 - 46.0 % 40.1  42.0  40.0   Platelets 150 - 400 K/uL 183  231  174       Latest Ref Rng & Units 03/30/2023   11:07 AM 09/23/2022   12:55 PM 03/25/2022    1:03 PM  CMP  Glucose 70 - 99 mg/dL 409  811  914   BUN 8 - 23 mg/dL 15  14  24    Creatinine 0.44 - 1.00 mg/dL 7.82  9.56  2.13   Sodium  135 - 145 mmol/L 140  139  141   Potassium 3.5 - 5.1 mmol/L 4.1  3.5  4.4   Chloride 98 - 111 mmol/L 106  106  106   CO2 22 - 32 mmol/L 26  23  27    Calcium 8.9 - 10.3 mg/dL 9.4  9.4  9.4   Total Protein 6.5 - 8.1 g/dL 6.9  7.4  7.0   Total Bilirubin 0.0 - 1.2 mg/dL 1.2  1.1  1.1   Alkaline Phos 38 - 126 U/L 64  79  78   AST 15 - 41 U/L 14  18  11    ALT 0 - 44 U/L 13  16  12     RADIOGRAPHIC STUDIES: I have personally reviewed the radiological images as listed and agreed with the findings in the report. DG ESOPHAGUS W DOUBLE CM (HD) Result Date: 03/12/2023 CLINICAL DATA:  Patient with a history of GERD, dysphagia chronic throat clearing. EXAM: ESOPHAGUS/BARIUM SWALLOW/TABLET STUDY TECHNIQUE: Combined double and single contrast examination was performed using effervescent crystals, high-density barium, and thin liquid barium. This exam was performed by Alwyn Ren NP, and was supervised and interpreted by Dr. Fredia Sorrow. FLUOROSCOPY: Radiation Exposure Index (as provided by the fluoroscopic device): 35.70 mGy Kerma COMPARISON:  None Available. FINDINGS: Swallowing: Appears normal. No vestibular penetration or aspiration seen. Pharynx: Unremarkable. Esophagus: Normal appearance. Esophageal motility: Esophageal dysmotility with patient in the prone drinking position. Hiatal Hernia: Small sliding hiatal hernia with associated Schatzki's ring. Gastroesophageal reflux: None visualized. Ingested 13mm barium tablet: Passed normally into the stomach. Other: None. IMPRESSION: 1. Esophageal dysmotility seen only in the prone drinking position. 2. Small sliding hernia with associated Schatzki's ring. No significant esophageal stricture visualized. 3. No active gastroesophageal reflux visualized during the exam. Electronically Signed   By: Irish Lack M.D.   On: 03/12/2023 10:47

## 2023-03-30 NOTE — Assessment & Plan Note (Addendum)
#  Stage IA right breast cancer, s/p lumpectomy/SLNB and radiation. Labs reviewed and discussed with patient. Continue Arimidex 1 mg daily. Plan total 5 years of endocrine therapy-till January 2027 Continue annual mammogram - she gets mammogram ordered through Dr.Cintron's office.next due Sept 2025

## 2023-03-30 NOTE — Assessment & Plan Note (Signed)
 Encourage oral hydration and avoid nephrotoxins.

## 2023-03-30 NOTE — Assessment & Plan Note (Addendum)
 12/29/21 DEXA showed osteopenia 10-year major osteoporotic fracture right is 15 %. Continue calcium and vitamin D supplementation Proceed with Zometa today- 3.3mg , renal dosing Repeat DEXA in Nov/Dec 2025

## 2023-04-22 ENCOUNTER — Encounter: Payer: Self-pay | Admitting: Gastroenterology

## 2023-04-22 ENCOUNTER — Ambulatory Visit: Admitting: General Practice

## 2023-04-22 ENCOUNTER — Encounter
Admission: RE | Disposition: A | Discharge status: Home / Self Care | Payer: Self-pay | Source: Home / Self Care | Attending: Gastroenterology

## 2023-04-22 ENCOUNTER — Other Ambulatory Visit: Payer: Self-pay

## 2023-04-22 ENCOUNTER — Ambulatory Visit
Admission: RE | Admit: 2023-04-22 | Discharge: 2023-04-22 | Disposition: A | Discharge status: Home / Self Care | Payer: Medicare PPO | Attending: Gastroenterology | Admitting: Gastroenterology

## 2023-04-22 DIAGNOSIS — K317 Polyp of stomach and duodenum: Secondary | ICD-10-CM | POA: Insufficient documentation

## 2023-04-22 DIAGNOSIS — K641 Second degree hemorrhoids: Secondary | ICD-10-CM | POA: Diagnosis not present

## 2023-04-22 DIAGNOSIS — K5520 Angiodysplasia of colon without hemorrhage: Secondary | ICD-10-CM | POA: Diagnosis not present

## 2023-04-22 DIAGNOSIS — Z800 Family history of malignant neoplasm of digestive organs: Secondary | ICD-10-CM | POA: Diagnosis not present

## 2023-04-22 DIAGNOSIS — G4730 Sleep apnea, unspecified: Secondary | ICD-10-CM | POA: Diagnosis not present

## 2023-04-22 DIAGNOSIS — K5730 Diverticulosis of large intestine without perforation or abscess without bleeding: Secondary | ICD-10-CM | POA: Insufficient documentation

## 2023-04-22 DIAGNOSIS — K449 Diaphragmatic hernia without obstruction or gangrene: Secondary | ICD-10-CM | POA: Diagnosis not present

## 2023-04-22 DIAGNOSIS — K2970 Gastritis, unspecified, without bleeding: Secondary | ICD-10-CM | POA: Insufficient documentation

## 2023-04-22 DIAGNOSIS — K5790 Diverticulosis of intestine, part unspecified, without perforation or abscess without bleeding: Secondary | ICD-10-CM | POA: Diagnosis not present

## 2023-04-22 DIAGNOSIS — K648 Other hemorrhoids: Secondary | ICD-10-CM | POA: Diagnosis not present

## 2023-04-22 DIAGNOSIS — Z1211 Encounter for screening for malignant neoplasm of colon: Secondary | ICD-10-CM | POA: Insufficient documentation

## 2023-04-22 DIAGNOSIS — K219 Gastro-esophageal reflux disease without esophagitis: Secondary | ICD-10-CM | POA: Diagnosis not present

## 2023-04-22 DIAGNOSIS — R1310 Dysphagia, unspecified: Secondary | ICD-10-CM | POA: Insufficient documentation

## 2023-04-22 DIAGNOSIS — I10 Essential (primary) hypertension: Secondary | ICD-10-CM | POA: Diagnosis not present

## 2023-04-22 DIAGNOSIS — Z79899 Other long term (current) drug therapy: Secondary | ICD-10-CM | POA: Insufficient documentation

## 2023-04-22 DIAGNOSIS — K222 Esophageal obstruction: Secondary | ICD-10-CM | POA: Diagnosis not present

## 2023-04-22 DIAGNOSIS — K224 Dyskinesia of esophagus: Secondary | ICD-10-CM | POA: Diagnosis not present

## 2023-04-22 HISTORY — PX: COLONOSCOPY WITH PROPOFOL: SHX5780

## 2023-04-22 HISTORY — PX: ESOPHAGOGASTRODUODENOSCOPY (EGD) WITH PROPOFOL: SHX5813

## 2023-04-22 HISTORY — PX: POLYPECTOMY: SHX5525

## 2023-04-22 SURGERY — COLONOSCOPY WITH PROPOFOL
Anesthesia: General

## 2023-04-22 MED ORDER — LIDOCAINE HCL (CARDIAC) PF 100 MG/5ML IV SOSY
PREFILLED_SYRINGE | INTRAVENOUS | Status: DC | PRN
  Administered 2023-04-22: 50 mg via INTRAVENOUS

## 2023-04-22 MED ORDER — IPRATROPIUM-ALBUTEROL 0.5-2.5 (3) MG/3ML IN SOLN
3.0000 mL | Freq: Once | RESPIRATORY_TRACT | Status: AC
Start: 2023-04-22 — End: 2023-04-22
  Administered 2023-04-22: 3 mL via RESPIRATORY_TRACT

## 2023-04-22 MED ORDER — IPRATROPIUM-ALBUTEROL 0.5-2.5 (3) MG/3ML IN SOLN
RESPIRATORY_TRACT | Status: AC
  Filled 2023-04-22: qty 3

## 2023-04-22 MED ORDER — SODIUM CHLORIDE 0.9 % IV SOLN: INTRAVENOUS | Status: DC

## 2023-04-22 MED ORDER — PROPOFOL 500 MG/50ML IV EMUL
INTRAVENOUS | Status: DC | PRN
  Administered 2023-04-22: 60 mg via INTRAVENOUS
  Administered 2023-04-22: 180 ug/kg/min via INTRAVENOUS

## 2023-04-22 MED ORDER — GLYCOPYRROLATE 0.2 MG/ML IJ SOLN
INTRAMUSCULAR | Status: DC | PRN
  Administered 2023-04-22: .2 mg via INTRAVENOUS

## 2023-04-22 MED ORDER — SODIUM CHLORIDE 0.9 % IV SOLN: INTRAVENOUS | Status: DC | PRN

## 2023-04-22 MED ORDER — PROPOFOL 1000 MG/100ML IV EMUL
INTRAVENOUS | Status: AC
  Filled 2023-04-22: qty 100

## 2023-04-22 NOTE — Transfer of Care (Signed)
 Immediate Anesthesia Transfer of Care Note  Patient: Barbara [name]  Procedure(s) Performed: COLONOSCOPY WITH PROPOFOL ESOPHAGOGASTRODUODENOSCOPY (EGD) WITH PROPOFOL POLYPECTOMY Balloon dilation wire-guided  Patient Location: PACU and Endoscopy Unit  Anesthesia Type:General  Level of Consciousness: drowsy  Airway & Oxygen Therapy: Patient Spontanous Breathing and Patient connected to nasal cannula oxygen  Post-op Assessment: Report given to RN and Post -op Vital signs reviewed and stable  Post vital signs: Reviewed and stable  Last Vitals:  Vitals Value Taken Time  BP 141/58 04/22/23 1010  Temp 35.6 C 04/22/23 1010  Pulse 92 04/22/23 1010  Resp 17 04/22/23 1010  SpO2      Last Pain:  Vitals:   04/22/23 1010  TempSrc: Temporal  PainSc: Asleep         Complications: No notable events documented.

## 2023-04-22 NOTE — Anesthesia Preprocedure Evaluation (Signed)
 Anesthesia Evaluation  Patient identified by MRN, date of birth, ID band Patient awake    Reviewed: Allergy & Precautions, H&P , NPO status , Patient's Chart, lab work & pertinent test results, reviewed documented beta blocker date and time   Airway Mallampati: II  TM Distance: >3 FB Neck ROM: full    Dental  (+) Teeth Intact, Dental Advidsory Given   Pulmonary sleep apnea    Pulmonary exam normal        Cardiovascular Exercise Tolerance: Poor hypertension, On Medications Normal cardiovascular exam Rate:Normal     Neuro/Psych  Headaches negative neurological ROS  negative psych ROS   GI/Hepatic negative GI ROS, Neg liver ROS,GERD  Medicated,,  Endo/Other  negative endocrine ROS    Renal/GU negative Renal ROS  negative genitourinary   Musculoskeletal   Abdominal   Peds  Hematology negative hematology ROS (+)   Anesthesia Other Findings Past Medical History: No date: Actinic keratosis No date: Cancer (HCC) No date: Family history of breast cancer No date: Family history of lung cancer No date: Family history of pancreatic cancer No date: Family history of uterine cancer No date: GERD (gastroesophageal reflux disease) No date: Headache     Comment:  migraines No date: Hypertension 08/08/2020: Melanoma (HCC)     Comment:  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, R lat mid back, exc               09/18/20 02/15/2020: Osteopenia No date: Personal history of radiation therapy No date: Pre-diabetes No date: Sleep apnea     Comment:  uses cpap  Past Surgical History: No date: APPENDECTOMY 10/20/2019: BREAST BIOPSY; Right     Comment:  Affirm bx-"X" clip positive 11/06/2020: BREAST BIOPSY; Left     Comment:  stereo bx, x clip, benign 11/06/2020: BREAST BIOPSY; Left     Comment:  Stereo bx-Distortion #2-"Coil" clip-benign 11/12/2021: BREAST BIOPSY; Left     Comment:  stereo bx/ ribbon clip/ benign No date: BREAST  CYST EXCISION; Right No date: BREAST CYST EXCISION; Right No date: BREAST CYST EXCISION; Left No date: BREAST LUMPECTOMY; Right No date: BREAST SURGERY No date: CHOLECYSTECTOMY 10/30/2019: PART MASTECTOMY,RADIO FREQUENCY LOCALIZER,AXILLARY  SENTINEL NODE BIOPSY; Right     Comment:  Procedure: PART MASTECTOMY,RADIO FREQUENCY               LOCALIZER,AXILLARY SENTINEL NODE BIOPSY;  Surgeon:               Carolan Shiver, MD;  Location: [facility] ORS;  Service:              General;  Laterality: Right;  BMI    Body Mass Index: 33.12 kg/m      Reproductive/Obstetrics negative OB ROS                             Anesthesia Physical Anesthesia Plan  ASA: 2  Anesthesia Plan: General   Post-op Pain Management: Minimal or no pain anticipated   Induction: Intravenous  PONV Risk Score and Plan: 3 and Propofol infusion, TIVA and Ondansetron  Airway Management Planned: Nasal Cannula  Additional Equipment: None  Intra-op Plan:   Post-operative Plan:   Informed Consent: I have reviewed the patients History and Physical, chart, labs and discussed the procedure including the risks, benefits and alternatives for the proposed anesthesia with the patient or authorized representative who has indicated his/her understanding and acceptance.     Dental advisory given  Plan Discussed  with: CRNA and Surgeon  Anesthesia Plan Comments: (Discussed risks of anesthesia with patient, including possibility of difficulty with spontaneous ventilation under anesthesia necessitating airway intervention, PONV, and rare risks such as cardiac or respiratory or neurological events, and allergic reactions. Discussed the role of CRNA in patient's perioperative care. Patient understands.)       Anesthesia Quick Evaluation

## 2023-04-22 NOTE — Op Note (Signed)
 [facility] Gastroenterology Patient Name: Barbara [name] Procedure Date: 04/22/2023 9:16 AM MRN: [id number] Account #: [id number] Date of Birth: 1951/01/23 Admit Type: Outpatient Age: 73 Room: [facility] ENDO ROOM 1 Gender: Female Note Status: Finalized Instrument Name: Prentice Docker [id number] Procedure:             Colonoscopy Indications:           Screening in patient at increased risk: Family history                         of 1st-degree relative with colorectal cancer Providers:             [name] DO, DO Medicines:             Monitored Anesthesia Care Complications:         No immediate complications. Estimated blood loss: None. Procedure:             Pre-Anesthesia Assessment:                        - Prior to the procedure, a History and Physical was                         performed, and patient medications and allergies were                         reviewed. The patient is competent. The risks and                         benefits of the procedure and the sedation options and                         risks were discussed with the patient. All questions                         were answered and informed consent was obtained.                         Patient identification and proposed procedure were                         verified by the physician, the nurse, the anesthetist                         and the technician in the endoscopy suite. Mental                         Status Examination: alert and oriented. Airway                         Examination: normal oropharyngeal airway and neck                         mobility. Respiratory Examination: clear to                         auscultation. CV Examination: RRR, no murmurs, no S3  or S4. Prophylactic Antibiotics: The patient does not                         require prophylactic antibiotics. Prior                         Anticoagulants: The patient has taken no anticoagulant                          or antiplatelet agents. ASA Grade Assessment: II - A                         patient with mild systemic disease. After reviewing                         the risks and benefits, the patient was deemed in                         satisfactory condition to undergo the procedure. The                         anesthesia plan was to use monitored anesthesia care                         (MAC). Immediately prior to administration of                         medications, the patient was re-assessed for adequacy                         to receive sedatives. The heart rate, respiratory                         rate, oxygen saturations, blood pressure, adequacy of                         pulmonary ventilation, and response to care were                         monitored throughout the procedure. The physical                         status of the patient was re-assessed after the                         procedure.                        After obtaining informed consent, the colonoscope was                         passed under direct vision. Throughout the procedure,                         the patient's blood pressure, pulse, and oxygen                         saturations were monitored continuously. The  Colonoscope was introduced through the anus and                         advanced to the the cecum, identified by appendiceal                         orifice and ileocecal valve. The colonoscopy was                         performed without difficulty. The patient tolerated                         the procedure well. The quality of the bowel                         preparation was evaluated using the BBPS [facility] Bowel                         Preparation Scale) with scores of: Right Colon = 3                         (entire mucosa seen well with no residual staining,                         small fragments of stool or opaque liquid), Transverse                          Colon = 3 (entire mucosa seen well with no residual                         staining, small fragments of stool or opaque liquid)                         and Left Colon = 2 (minor amount of residual staining,                         small fragments of stool and/or opaque liquid, but                         mucosa seen well). The total BBPS score equals 8. The                         quality of the bowel preparation was excellent. The                         ileocecal valve, appendiceal orifice, and rectum were                         photographed. Findings:      Hemorrhoids were found on perianal exam.      The digital rectal exam was normal. Pertinent negatives include normal       sphincter tone.      Non-bleeding internal hemorrhoids were found during retroflexion and       during perianal exam. The hemorrhoids were Grade II (internal       hemorrhoids that prolapse but reduce spontaneously). Estimated blood       loss:  none.      Multiple small-mouthed diverticula were found in the sigmoid colon.       Estimated blood loss: none.      A single medium-sized localized angioectasia without bleeding was found       in the proximal ascending colon. Estimated blood loss: none.      The exam was otherwise without abnormality on direct and retroflexion       views. Impression:            - Hemorrhoids found on perianal exam.                        - Non-bleeding internal hemorrhoids.                        - Diverticulosis in the sigmoid colon.                        - A single non-bleeding colonic angioectasia.                        - The examination was otherwise normal on direct and                         retroflexion views.                        - No specimens collected. Recommendation:        - Patient has a contact number available for                         emergencies. The signs and symptoms of potential                         delayed complications were discussed with the  patient.                         Return to normal activities tomorrow. Written                         discharge instructions were provided to the patient.                        - Discharge patient to home.                        - Resume previous diet.                        - Continue present medications.                        - Repeat colonoscopy in 5 years for screening purposes.                        - Return to GI office as previously scheduled.                        - The findings and recommendations were discussed with  the patient. Procedure Code(s):     --- Professional ---                        [phone], Colonoscopy, flexible; diagnostic, including                         collection of specimen(s) by brushing or washing, when                         performed (separate procedure) Diagnosis Code(s):     --- Professional ---                        Z80.0, Family history of malignant neoplasm of                         digestive organs                        K64.1, Second degree hemorrhoids                        K55.20, Angiodysplasia of colon without hemorrhage                        K57.30, Diverticulosis of large intestine without                         perforation or abscess without bleeding CPT copyright 2022 American Medical Association. All rights reserved. The codes documented in this report are preliminary and upon coder review may  be revised to meet current compliance requirements. Attending Participation:      I personally performed the entire procedure. Elfredia Nevins, DO [name] DO, DO 04/22/2023 10:14:26 AM This report has been signed electronically. Number of Addenda: 0 Note Initiated On: 04/22/2023 9:16 AM Scope Withdrawal Time: 0 hours 10 minutes 44 seconds  Total Procedure Duration: 0 hours 15 minutes 6 seconds  Estimated Blood Loss:  Estimated blood loss: none.      [facility]

## 2023-04-22 NOTE — Progress Notes (Signed)
 Coughing upon arrival in recovery. Nebulizer duoneb adm. Coughing easing.

## 2023-04-22 NOTE — Op Note (Signed)
 [facility] Gastroenterology Patient Name: Barbara [name] Procedure Date: 04/22/2023 9:16 AM MRN: [id number] Account #: [id number] Date of Birth: 09-18-1950 Admit Type: Outpatient Age: 73 Room: [facility] ENDO ROOM 1 Gender: Female Note Status: Finalized Instrument Name: Upper Endoscope [id number] Procedure:             Upper GI endoscopy Indications:           Dysphagia, Esophageal reflux Providers:             [name] DO, DO Medicines:             Monitored Anesthesia Care Complications:         No immediate complications. Estimated blood loss:                         Minimal. Procedure:             Pre-Anesthesia Assessment:                        - Prior to the procedure, a History and Physical was                         performed, and patient medications and allergies were                         reviewed. The patient is competent. The risks and                         benefits of the procedure and the sedation options and                         risks were discussed with the patient. All questions                         were answered and informed consent was obtained.                         Patient identification and proposed procedure were                         verified by the physician, the nurse, the anesthetist                         and the technician in the endoscopy suite. Mental                         Status Examination: alert and oriented. Airway                         Examination: normal oropharyngeal airway and neck                         mobility. Respiratory Examination: clear to                         auscultation. CV Examination: RRR, no murmurs, no S3                         or S4. Prophylactic Antibiotics: The patient does  not                         require prophylactic antibiotics. Prior                         Anticoagulants: The patient has taken no anticoagulant                         or antiplatelet agents. ASA Grade  Assessment: II - A                         patient with mild systemic disease. After reviewing                         the risks and benefits, the patient was deemed in                         satisfactory condition to undergo the procedure. The                         anesthesia plan was to use monitored anesthesia care                         (MAC). Immediately prior to administration of                         medications, the patient was re-assessed for adequacy                         to receive sedatives. The heart rate, respiratory                         rate, oxygen saturations, blood pressure, adequacy of                         pulmonary ventilation, and response to care were                         monitored throughout the procedure. The physical                         status of the patient was re-assessed after the                         procedure.                        After obtaining informed consent, the endoscope was                         passed under direct vision. Throughout the procedure,                         the patient's blood pressure, pulse, and oxygen                         saturations were monitored continuously. The Endoscope  was introduced through the mouth, and advanced to the                         second part of duodenum. The upper GI endoscopy was                         accomplished without difficulty. The patient tolerated                         the procedure well. Findings:      The duodenal bulb, first portion of the duodenum and second portion of       the duodenum were normal. Estimated blood loss: none.      Localized mild inflammation characterized by erosions, erythema and       linear erosions was found in the gastric antrum. Biopsies were taken       with a cold forceps for Helicobacter pylori testing. Estimated blood       loss was minimal.      Multiple 1 to 10 mm sessile polyps with no bleeding and no  stigmata of       recent bleeding were found in the gastric body. Biopsies were taken with       a cold forceps for histology. Estimated blood loss was minimal.      A 3 cm hiatal hernia was present. Estimated blood loss: none.      The exam of the stomach was otherwise normal.      The Z-line was regular. Estimated blood loss: none.      Esophagogastric landmarks were identified: the gastroesophageal junction       was found at 37 cm from the incisors.      A widely patent Schatzki ring was found at the gastroesophageal       junction. A TTS dilator was passed through the scope. Dilation with a       15-16.5-18 mm balloon dilator was performed to 15 mm, 16.5 mm and 18 mm.       The dilation site was examined following endoscope reinsertion and       showed no change. This was biopsied with a cold forceps for disruption       of Schatski ring. no specimen collected. Estimated blood loss was       minimal.      Abnormal motility was noted in the esophagus. The cricopharyngeus was       normal. There is spasticity of the esophageal body. The distal       esophagus/lower esophageal sphincter is open. Estimated blood loss: none.      The exam of the esophagus was otherwise normal. Impression:            - Normal duodenal bulb, first portion of the duodenum                         and second portion of the duodenum.                        - Gastritis. Biopsied.                        - Multiple gastric polyps. Biopsied.                        -  3 cm hiatal hernia.                        - Z-line regular.                        - Esophagogastric landmarks identified.                        - Widely patent Schatzki ring. Dilated. Biopsied.                        - Abnormal esophageal motility, suspicious for                         esophageal spasm. Recommendation:        - Patient has a contact number available for                         emergencies. The signs and symptoms of potential                          delayed complications were discussed with the patient.                         Return to normal activities tomorrow. Written                         discharge instructions were provided to the patient.                        - Discharge patient to home.                        - Resume previous diet.                        - Continue present medications.                        - No ibuprofen, naproxen, or other non-steroidal                         anti-inflammatory drugs.                        - Increase ppi to twice a day for 8 weeks.                        - Await pathology results.                        - Repeat upper endoscopy PRN for retreatment.                        - Return to GI office as previously scheduled.                        - proceed with colonoscopy. see report for details.                        - The findings and recommendations  were discussed with                         the patient. Procedure Code(s):     --- Professional ---                        [phone], Esophagogastroduodenoscopy, flexible,                         transoral; with transendoscopic balloon dilation of                         esophagus (less than 30 mm diameter)                        [id number], 59, Esophagogastroduodenoscopy, flexible,                         transoral; with biopsy, single or multiple Diagnosis Code(s):     --- Professional ---                        K29.70, Gastritis, unspecified, without bleeding                        K31.7, Polyp of stomach and duodenum                        K44.9, Diaphragmatic hernia without obstruction or                         gangrene                        K22.2, Esophageal obstruction                        K22.4, Dyskinesia of esophagus                        R13.10, Dysphagia, unspecified                        K21.9, Gastro-esophageal reflux disease without                         esophagitis CPT copyright 2022 American Medical  Association. All rights reserved. The codes documented in this report are preliminary and upon coder review may  be revised to meet current compliance requirements. Attending Participation:      I personally performed the entire procedure. Elfredia Nevins, DO [name] DO, DO 04/22/2023 9:49:52 AM This report has been signed electronically. Number of Addenda: 0 Note Initiated On: 04/22/2023 9:16 AM Estimated Blood Loss:  Estimated blood loss was minimal.      [facility]

## 2023-04-22 NOTE — Anesthesia Postprocedure Evaluation (Signed)
 Anesthesia Post Note  Patient: Barbara [name]  Procedure(s) Performed: COLONOSCOPY WITH PROPOFOL ESOPHAGOGASTRODUODENOSCOPY (EGD) WITH PROPOFOL POLYPECTOMY Balloon dilation wire-guided  Patient location during evaluation: Endoscopy Anesthesia Type: General Level of consciousness: awake and alert Pain management: pain level controlled Vital Signs Assessment: post-procedure vital signs reviewed and stable Respiratory status: spontaneous breathing, nonlabored ventilation, respiratory function stable and patient connected to nasal cannula oxygen Cardiovascular status: blood pressure returned to baseline and stable Postop Assessment: no apparent nausea or vomiting Anesthetic complications: no  There were no known notable events for this encounter.   Last Vitals:  Vitals:   04/22/23 1030 04/22/23 1040  BP:    Pulse: 79 77  Resp: 14 (!) 22  Temp:    SpO2: 100% 99%    Last Pain:  Vitals:   04/22/23 1020  TempSrc:   PainSc: 0-No pain                 Stephanie Coup

## 2023-04-22 NOTE — H&P (Signed)
 Pre-Procedure H&P   Patient ID: Barbara [name] is a 73 y.o. female.  Gastroenterology Provider: [name], DO  Referring Provider: Fransico Setters, NP PCP: Marina Goodell, MD  Date: 04/22/2023  HPI Barbara [name] is a 73 y.o. female who presents today for Esophagogastroduodenoscopy and Colonoscopy for gerd, dysphagia, crc screening (fhx colon cancer) .  Patient notes dysphagia symptoms to mostly pills 1-2 times a week.  Occasional with solids.  Liquids overall move well.  Bowels moving well no melena or hematochezia  Mother with history of colon cancer.  The patient's last EGD and colonoscopy was in November 2019.  Hyperplastic polyp sigmoid diverticulosis and internal hemorrhoids were noted in the colon.  The EGD was unremarkable.  She did have a history of a Schatzki's ring on EGD in 2007 and most recent barium swallow in February.  That barium swallow also demonstrated esophageal dysmotility small hiatal hernia but no reflux.  Creatinine 0.9 hemoglobin 13.5 MCV 82 platelets 231,000   Past Medical History:  Diagnosis Date   Actinic keratosis    Cancer (HCC)    Family history of breast cancer    Family history of lung cancer    Family history of pancreatic cancer    Family history of uterine cancer    GERD (gastroesophageal reflux disease)    Headache    migraines   Hypertension    Melanoma (HCC) 08/08/2020   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, R lat mid back, exc 09/18/20   Osteopenia 02/15/2020   Personal history of radiation therapy    Pre-diabetes    Sleep apnea    uses cpap    Past Surgical History:  Procedure Laterality Date   APPENDECTOMY     BREAST BIOPSY Right 10/20/2019   Affirm bx-"X" clip positive   BREAST BIOPSY Left 11/06/2020   stereo bx, x clip, benign   BREAST BIOPSY Left 11/06/2020   Stereo bx-Distortion #2-"Coil" clip-benign   BREAST BIOPSY Left 11/12/2021   stereo bx/ ribbon clip/ benign   BREAST CYST EXCISION Right     BREAST CYST EXCISION Right    BREAST CYST EXCISION Left    BREAST LUMPECTOMY Right    BREAST SURGERY     CHOLECYSTECTOMY     PART MASTECTOMY,RADIO FREQUENCY LOCALIZER,AXILLARY SENTINEL NODE BIOPSY Right 10/30/2019   Procedure: PART MASTECTOMY,RADIO FREQUENCY LOCALIZER,AXILLARY SENTINEL NODE BIOPSY;  Surgeon: Carolan Shiver, MD;  Location: [facility] ORS;  Service: General;  Laterality: Right;    Family History Father- pancreatic cancer Mother- crc Daughter- colon polyps No other h/o GI disease or malignancy  Review of Systems  Constitutional:  Negative for activity change, appetite change, chills, diaphoresis, fatigue, fever and unexpected weight change.  HENT:  Positive for trouble swallowing. Negative for voice change.   Respiratory:  Negative for shortness of breath and wheezing.   Cardiovascular:  Negative for chest pain, palpitations and leg swelling.  Gastrointestinal:  Negative for abdominal distention, abdominal pain, anal bleeding, blood in stool, constipation, diarrhea, nausea, rectal pain and vomiting.  Musculoskeletal:  Negative for arthralgias and myalgias.  Skin:  Negative for color change and pallor.  Neurological:  Negative for dizziness, syncope and weakness.  Psychiatric/Behavioral:  Negative for confusion.   All other systems reviewed and are negative.    Medications No current facility-administered medications on file prior to encounter.   Current Outpatient Medications on File Prior to Encounter  Medication Sig Dispense Refill   amLODipine (NORVASC) 5 MG tablet Take 5 mg by mouth  at bedtime.      anastrozole (ARIMIDEX) 1 MG tablet Take 1 tablet (1 mg total) by mouth daily. 90 tablet 3   aspirin 81 MG EC tablet Take 81 mg by mouth daily.      Calcium Carbonate-Vit D-Min (CALCIUM 1200 PO) Take by mouth.     cetirizine (ZYRTEC) 10 MG tablet Take 10 mg by mouth every morning.      Cholecalciferol (VITAMIN D) 50 MCG (2000 UT) CAPS Take 2,000 Units by mouth  daily.     CINNAMON PO Take 1,000 mg by mouth in the morning and at bedtime.      lisinopril (ZESTRIL) 2.5 MG tablet Take 2.5 mg by mouth daily.     lovastatin (MEVACOR) 40 MG tablet Take 40 mg by mouth at bedtime.      MAGNESIUM GLYCINATE PO Take 200 mg by mouth.     pantoprazole (PROTONIX) 40 MG tablet Take 40 mg by mouth every morning.      azelastine (ASTELIN) 0.1 % nasal spray Place into the nose.     Biotin 95621 MCG TABS Take 1,000 tablets by mouth 2 (two) times daily.     calcipotriene-betamethasone (TACLONEX) external suspension Apply to affected area ears at night until improved. 60 g 1   clobetasol cream (TEMOVATE) 0.05 % Apply 1 application topically 2 (two) times daily. 45 g 1   Efinaconazole (JUBLIA) 10 % SOLN Apply a thin coat to the nails QHS. 4 mL 11   fluconazole (DIFLUCAN) 200 MG tablet Take 1 tablet (200 mg total) by mouth daily. (Patient not taking: Reported on 04/22/2023) 15 tablet 0   Fluocinolone Acetonide 0.01 % OIL Apply 1-2 times a day as needed to ear canals. 20 mL 11   Krill Oil 500 MG CAPS Take 500 mg by mouth daily.     meloxicam (MOBIC) 15 MG tablet Take 1 tablet by mouth daily.     mometasone (ELOCON) 0.1 % cream APPLY TO THE AFFECTED AREAS OF EARS DAILY AS NEEDED FOR RASH FOR FLARES 45 g 0    Pertinent medications related to GI and procedure were reviewed by me with the patient prior to the procedure   Current Facility-Administered Medications:    0.9 %  sodium chloride infusion, , Intravenous, Continuous, [name], DO, Last Rate: 20 mL/hr at 04/22/23 0911, New Bag at 04/22/23 0911  sodium chloride 20 mL/hr at 04/22/23 3086       No Known Allergies Allergies were reviewed by me prior to the procedure  Objective   Body mass index is 33.12 kg/m. Vitals:   04/22/23 0902  BP: 118/67  Pulse: 84  Resp: 14  Temp: (!) 97 F (36.1 C)  TempSrc: Temporal  SpO2: 100%  Weight: 90.3 kg  Height: 5\' 5"  (1.651 m)     Physical Exam Vitals  and nursing note reviewed.  Constitutional:      General: She is not in acute distress.    Appearance: Normal appearance. She is not ill-appearing, toxic-appearing or diaphoretic.  HENT:     Head: Normocephalic and atraumatic.     Nose: Nose normal.     Mouth/Throat:     Mouth: Mucous membranes are moist.     Pharynx: Oropharynx is clear.  Eyes:     General: No scleral icterus.    Extraocular Movements: Extraocular movements intact.  Cardiovascular:     Rate and Rhythm: Normal rate and regular rhythm.     Heart sounds: Normal heart sounds. No murmur  heard.    No friction rub. No gallop.  Pulmonary:     Effort: Pulmonary effort is normal. No respiratory distress.     Breath sounds: Normal breath sounds. No wheezing, rhonchi or rales.  Abdominal:     General: Bowel sounds are normal. There is no distension.     Palpations: Abdomen is soft.     Tenderness: There is no abdominal tenderness. There is no guarding or rebound.  Musculoskeletal:     Cervical back: Neck supple.     Right lower leg: No edema.     Left lower leg: No edema.  Skin:    General: Skin is warm and dry.     Coloration: Skin is not jaundiced or pale.  Neurological:     General: No focal deficit present.     Mental Status: She is alert and oriented to person, place, and time. Mental status is at baseline.  Psychiatric:        Mood and Affect: Mood normal.        Behavior: Behavior normal.        Thought Content: Thought content normal.        Judgment: Judgment normal.      Assessment:  Barbara [name] is a 73 y.o. female  who presents today for Esophagogastroduodenoscopy and Colonoscopy for gerd, dysphagia, crc screening (fhx colon cancer) .  Plan:  Esophagogastroduodenoscopy and Colonoscopy with possible intervention today  Esophagogastroduodenoscopy and Colonoscopy with possible biopsy, control of bleeding, polypectomy, and interventions as necessary has been discussed with the patient/patient  representative. Informed consent was obtained from the patient/patient representative after explaining the indication, nature, and risks of the procedure including but not limited to death, bleeding, perforation, missed neoplasm/lesions, cardiorespiratory compromise, and reaction to medications. Opportunity for questions was given and appropriate answers were provided. Patient/patient representative has verbalized understanding is amenable to undergoing the procedure.   [name], DO  [facility]  Portions of the record may have been created with voice recognition software. Occasional wrong-word or 'sound-a-like' substitutions may have occurred due to the inherent limitations of voice recognition software.  Read the chart carefully and recognize, using context, where substitutions may have occurred.

## 2023-04-22 NOTE — Interval H&P Note (Signed)
 History and Physical Interval Note: Preprocedure H&P from 04/22/23  was reviewed and there was no interval change after seeing and examining the patient.  Written consent was obtained from the patient after discussion of risks, benefits, and alternatives. Patient has consented to proceed with Esophagogastroduodenoscopy and Colonoscopy with possible intervention   04/22/2023 9:29 AM  Barbara [name]  has presented today for surgery, with the diagnosis of Z80.0 (ICD-10-CM) - FH: colon cancer K21.9 (ICD-10-CM) - Gastroesophageal reflux disease, unspecified whether esophagitis present R13.10 (ICD-10-CM) - Dysphagia, unspecified type R09.89 (ICD-10-CM) - Chronic throat clearing.  The various methods of treatment have been discussed with the patient and family. After consideration of risks, benefits and other options for treatment, the patient has consented to  Procedure(s): COLONOSCOPY WITH PROPOFOL (N/A) ESOPHAGOGASTRODUODENOSCOPY (EGD) WITH PROPOFOL (N/A) as a surgical intervention.  The patient's history has been reviewed, patient examined, no change in status, stable for surgery.  I have reviewed the patient's chart and labs.  Questions were answered to the patient's satisfaction.     [name]

## 2023-04-23 LAB — SURGICAL PATHOLOGY

## 2023-05-24 DIAGNOSIS — H2511 Age-related nuclear cataract, right eye: Secondary | ICD-10-CM | POA: Diagnosis not present

## 2023-05-24 DIAGNOSIS — H2513 Age-related nuclear cataract, bilateral: Secondary | ICD-10-CM | POA: Diagnosis not present

## 2023-05-24 DIAGNOSIS — H2640 Unspecified secondary cataract: Secondary | ICD-10-CM | POA: Diagnosis not present

## 2023-05-24 DIAGNOSIS — Z0100 Encounter for examination of eyes and vision without abnormal findings: Secondary | ICD-10-CM | POA: Diagnosis not present

## 2023-05-24 DIAGNOSIS — H2512 Age-related nuclear cataract, left eye: Secondary | ICD-10-CM | POA: Diagnosis not present

## 2023-05-24 DIAGNOSIS — H35363 Drusen (degenerative) of macula, bilateral: Secondary | ICD-10-CM | POA: Diagnosis not present

## 2023-06-02 DIAGNOSIS — N1831 Chronic kidney disease, stage 3a: Secondary | ICD-10-CM | POA: Diagnosis not present

## 2023-06-02 DIAGNOSIS — I10 Essential (primary) hypertension: Secondary | ICD-10-CM | POA: Diagnosis not present

## 2023-06-02 DIAGNOSIS — E119 Type 2 diabetes mellitus without complications: Secondary | ICD-10-CM | POA: Diagnosis not present

## 2023-06-02 DIAGNOSIS — E7800 Pure hypercholesterolemia, unspecified: Secondary | ICD-10-CM | POA: Diagnosis not present

## 2023-06-03 DIAGNOSIS — N760 Acute vaginitis: Secondary | ICD-10-CM | POA: Diagnosis not present

## 2023-06-03 DIAGNOSIS — B9689 Other specified bacterial agents as the cause of diseases classified elsewhere: Secondary | ICD-10-CM | POA: Diagnosis not present

## 2023-06-03 DIAGNOSIS — R300 Dysuria: Secondary | ICD-10-CM | POA: Diagnosis not present

## 2023-06-03 DIAGNOSIS — N898 Other specified noninflammatory disorders of vagina: Secondary | ICD-10-CM | POA: Diagnosis not present

## 2023-06-15 ENCOUNTER — Encounter: Payer: Self-pay | Admitting: Dermatology

## 2023-06-15 ENCOUNTER — Ambulatory Visit (INDEPENDENT_AMBULATORY_CARE_PROVIDER_SITE_OTHER): Payer: Medicare PPO | Admitting: Dermatology

## 2023-06-15 DIAGNOSIS — Z1283 Encounter for screening for malignant neoplasm of skin: Secondary | ICD-10-CM | POA: Diagnosis not present

## 2023-06-15 DIAGNOSIS — L814 Other melanin hyperpigmentation: Secondary | ICD-10-CM | POA: Diagnosis not present

## 2023-06-15 DIAGNOSIS — D1801 Hemangioma of skin and subcutaneous tissue: Secondary | ICD-10-CM

## 2023-06-15 DIAGNOSIS — D229 Melanocytic nevi, unspecified: Secondary | ICD-10-CM

## 2023-06-15 DIAGNOSIS — X32XXXD Exposure to sunlight, subsequent encounter: Secondary | ICD-10-CM

## 2023-06-15 DIAGNOSIS — L578 Other skin changes due to chronic exposure to nonionizing radiation: Secondary | ICD-10-CM

## 2023-06-15 DIAGNOSIS — W908XXD Exposure to other nonionizing radiation, subsequent encounter: Secondary | ICD-10-CM

## 2023-06-15 DIAGNOSIS — Z86006 Personal history of melanoma in-situ: Secondary | ICD-10-CM

## 2023-06-15 DIAGNOSIS — L300 Nummular dermatitis: Secondary | ICD-10-CM | POA: Diagnosis not present

## 2023-06-15 DIAGNOSIS — L821 Other seborrheic keratosis: Secondary | ICD-10-CM

## 2023-06-15 NOTE — Progress Notes (Unsigned)
 Follow-Up Visit   Subjective  Barbara [name] is a 73 y.o. female who presents for the following: Skin Cancer Screening and Full Body Skin Exam. Hx of MIS, R lateral mid back. Hx of AKs.   Scaly rough patch on right foot/ankle. Has used moisturizer.    The patient presents for Total-Body Skin Exam (TBSE) for skin cancer screening and mole check. The patient has spots, moles and lesions to be evaluated, some may be new or changing.   The following portions of the chart were reviewed this encounter and updated as appropriate: medications, allergies, medical history  Review of Systems:  No other skin or systemic complaints except as noted in HPI or Assessment and Plan.  Objective  Well appearing patient in no apparent distress; mood and affect are within normal limits.  A full examination was performed including scalp, head, eyes, ears, nose, lips, neck, chest, axillae, abdomen, back, buttocks, bilateral upper extremities, bilateral lower extremities, hands, feet, fingers, toes, fingernails, and toenails. All findings within normal limits unless otherwise noted below.   Relevant physical exam findings are noted in the Assessment and Plan.     Assessment & Plan   SKIN CANCER SCREENING PERFORMED TODAY.  ACTINIC DAMAGE - Chronic condition, secondary to cumulative UV/sun exposure - diffuse scaly erythematous macules with underlying dyspigmentation - Recommend daily broad spectrum sunscreen SPF 30+ to sun-exposed areas, reapply every 2 hours as needed.  - Staying in the shade or wearing long sleeves, sun glasses (UVA+UVB protection) and wide brim hats (4-inch brim around the entire circumference of the hat) are also recommended for sun protection.  - Call for new or changing lesions.  LENTIGINES, SEBORRHEIC KERATOSES, HEMANGIOMAS - Benign normal skin lesions - Benign-appearing - Call for any changes   MELANOCYTIC NEVI - Tan-brown and/or pink-flesh-colored symmetric macules and  papules - L post upper arm - 4.57mm 2 tone brown pap with emerging hair - R med pretibia - 3.62mm brown macule - R mid back - 2.75mm med brown macule - 2 mm gray brown macule right hand dorsum; Vs SK - 4 mm brown macule with central clearing at right posterior thigh Benign-appearing. Stable compared to previous visit. Observation.  Call clinic for new or changing moles.  Recommend daily use of broad spectrum spf 30+ sunscreen to sun-exposed areas.    HEMANGIOMA Exam: red papules at medial right knee Discussed benign nature. Recommend observation. Call for changes.   HISTORY OF MELANOMA IN SITU Right lateral mid back, exc 09/18/2020 - No evidence of recurrence today - Recommend regular full body skin exams - Recommend daily broad spectrum sunscreen SPF 30+ to sun-exposed areas, reapply every 2 hours as needed.  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 Condition is symptomatic/ bothersome to patient. Not currently at goal.   Nummular dermatitis (eczema) is a chronic, relapsing, itchy rash that can significantly affect quality of life. It is often associated with dry skin and flares in the wintertime, and may require treatment with prescription topical anti-inflammatory medications, in addition to gentle skin care.  If there is associated atopic dermatitis and topicals are not working, then biologic injections may be necessary to clear rash and control symptoms.  Treatment Plan: Pt defers Rx today Recommend using OTC Hydrocortisone cream twice a day as needed  Recommend mild soap and moisturizing cream 1-2 times daily.  Gentle skin care handout provided.       Return in about 6 months (around 12/16/2023) for TBSE, HxMIS.  I, Jill Parcell, CMA, am acting as scribe for Artemio Larry,  MD.    Documentation: I have reviewed the above documentation for accuracy and completeness, and I agree with the above.  Artemio Larry, MD

## 2023-06-15 NOTE — Patient Instructions (Addendum)
 Recommend daily broad spectrum sunscreen SPF 30+ to sun-exposed areas, reapply every 2 hours as needed. Call for new or changing lesions.  Staying in the shade or wearing long sleeves, sun glasses (UVA+UVB protection) and wide brim hats (4-inch brim around the entire circumference of the hat) are also recommended for sun protection.      Melanoma ABCDEs  Melanoma is the most dangerous type of skin cancer, and is the leading cause of death from skin disease.  You are more likely to develop melanoma if you: Have light-colored skin, light-colored eyes, or red or blond hair Spend a lot of time in the sun Tan regularly, either outdoors or in a tanning bed Have had blistering sunburns, especially during childhood Have a close family member who has had a melanoma Have atypical moles or large birthmarks  Early detection of melanoma is key since treatment is typically straightforward and cure rates are extremely high if we catch it early.   The first sign of melanoma is often a change in a mole or a new dark spot.  The ABCDE system is a way of remembering the signs of melanoma.  A for asymmetry:  The two halves do not match. B for border:  The edges of the growth are irregular. C for color:  A mixture of colors are present instead of an even brown color. D for diameter:  Melanomas are usually (but not always) greater than 6mm - the size of a pencil eraser. E for evolution:  The spot keeps changing in size, shape, and color.  Please check your skin once per month between visits. You can use a small mirror in front and a large mirror behind you to keep an eye on the back side or your body.   If you see any new or changing lesions before your next follow-up, please call to schedule a visit.  Please continue daily skin protection including broad spectrum sunscreen SPF 30+ to sun-exposed areas, reapplying every 2 hours as needed when you're outdoors.   Staying in the shade or wearing long sleeves,  sun glasses (UVA+UVB protection) and wide brim hats (4-inch brim around the entire circumference of the hat) are also recommended for sun protection.      Spot on ankle: Recommend using OTC Hydrocortisone cream twice a day as needed   Recommend starting moisturizer with exfoliant (Urea, Salicylic acid, or Lactic acid) one to two times daily to help smooth rough and bumpy skin.  OTC options include Cetaphil Rough and Bumpy lotion (Urea), Eucerin Roughness Relief lotion or spot treatment cream (Urea), CeraVe SA lotion/cream for Rough and Bumpy skin (Sal Acid), Gold Bond Rough and Bumpy cream (Sal Acid), and AmLactin 12% lotion/cream (Lactic Acid).  If applying in morning, also apply sunscreen to sun-exposed areas, since these exfoliating moisturizers can increase sensitivity to sun.     Due to recent changes in healthcare laws, you may see results of your pathology and/or laboratory studies on MyChart before the doctors have had a chance to review them. We understand that in some cases there may be results that are confusing or concerning to you. Please understand that not all results are received at the same time and often the doctors may need to interpret multiple results in order to provide you with the best plan of care or course of treatment. Therefore, we ask that you please give us  2 business days to thoroughly review all your results before contacting the office for clarification. Should we see a  critical lab result, you will be contacted sooner.   If You Need Anything After Your Visit  If you have any questions or concerns for your doctor, please call our main line at [phone] and press option 4 to reach your doctor's medical assistant. If no one answers, please leave a voicemail as directed and we will return your call as soon as possible. Messages left after 4 pm will be answered the following business day.   You may also send us  a message via MyChart. We typically respond to MyChart  messages within 1-2 business days.  For prescription refills, please ask your pharmacy to contact our office. Our fax number is [phone].  If you have an urgent issue when the clinic is closed that cannot wait until the next business day, you can page your doctor at the number below.    Please note that while we do our best to be available for urgent issues outside of office hours, we are not available 24/7.   If you have an urgent issue and are unable to reach us , you may choose to seek medical care at your doctor's office, retail clinic, urgent care center, or emergency room.  If you have a medical emergency, please immediately call 911 or go to the emergency department.  Pager Numbers  - Dr. Bary Likes: [phone]  - Dr. Annette Barters: [phone]  - Dr. Felipe Horton: [phone]   In the event of inclement weather, please call our main line at [phone] for an update on the status of any delays or closures.  Dermatology Medication Tips: Please keep the boxes that topical medications come in in order to help keep track of the instructions about where and how to use these. Pharmacies typically print the medication instructions only on the boxes and not directly on the medication tubes.   If your medication is too expensive, please contact our office at [phone] option 4 or send us  a message through MyChart.   We are unable to tell what your co-pay for medications will be in advance as this is different depending on your insurance coverage. However, we may be able to find a substitute medication at lower cost or fill out paperwork to get insurance to cover a needed medication.   If a prior authorization is required to get your medication covered by your insurance company, please allow us  1-2 business days to complete this process.  Drug prices often vary depending on where the prescription is filled and some pharmacies may offer cheaper prices.  The website www.goodrx.com contains  coupons for medications through different pharmacies. The prices here do not account for what the cost may be with help from insurance (it may be cheaper with your insurance), but the website can give you the price if you did not use any insurance.  - You can print the associated coupon and take it with your prescription to the pharmacy.  - You may also stop by our office during regular business hours and pick up a GoodRx coupon card.  - If you need your prescription sent electronically to a different pharmacy, notify our office through [facility] or by phone at [phone] option 4.     Si Usted Necesita Algo Despus de Su Visita  Tambin puede enviarnos un mensaje a travs de Clinical cytogeneticist. Por lo general respondemos a los mensajes de MyChart en el transcurso de 1 a 2 das hbiles.  Para renovar recetas, por favor pida a su farmacia que se ponga en contacto con  nuestra oficina. Franz Jacks de fax es [place] [phone].  Si tiene un asunto urgente cuando la clnica est cerrada y que no puede esperar hasta el siguiente da hbil, puede llamar/localizar a su doctor(a) al nmero que aparece a continuacin.   Por favor, tenga en cuenta que aunque hacemos todo lo posible para estar disponibles para asuntos urgentes fuera del horario de [place], no estamos disponibles las 24 horas del da, los 7 [address]  [address] de la [place].   Si tiene un problema urgente y no puede comunicarse con nosotros, puede optar por buscar atencin mdica  en el consultorio de su doctor(a), en una clnica privada, en un centro de atencin urgente o en una sala de emergencias.  Si tiene Engineer, drilling, por favor llame inmediatamente al 911 o vaya a la sala de emergencias.  Nmeros de bper  - Dr. Bary Likes: [phone]  - Dra. Annette Barters: [phone]  - Dr. Felipe Horton: [phone]   En caso de inclemencias del tiempo, por favor llame a Lajuan Pila principal al [phone] para una actualizacin sobre el [place] de  cualquier retraso o cierre.  Consejos para la medicacin en dermatologa: Por favor, guarde las cajas en las que vienen los medicamentos de uso tpico para ayudarle a seguir las instrucciones sobre dnde y cmo usarlos. Las farmacias generalmente imprimen las instrucciones del medicamento slo en las cajas y no directamente en los tubos del [place].   Si su medicamento es muy caro, por favor, pngase en contacto con Bettyjane Brunet llamando al [phone] y presione la opcin 4 o envenos un mensaje a travs de Clinical cytogeneticist.   No podemos decirle cul ser su copago por los medicamentos por adelantado ya que esto es diferente dependiendo de la cobertura de su seguro. Sin embargo, es posible que podamos encontrar un medicamento sustituto a Audiological scientist un formulario para que el seguro cubra el medicamento que se considera necesario.   Si se requiere una autorizacin previa para que su compaa de seguros [place] su medicamento, por favor permtanos de 1 a 2 das hbiles para completar este proceso.  Los precios de los medicamentos varan con frecuencia dependiendo del Environmental consultant de dnde se surte la receta y alguna farmacias pueden ofrecer precios ms baratos.  El sitio web www.goodrx.com tiene cupones para medicamentos de Health and safety inspector. Los precios aqu no tienen en cuenta lo que podra costar con la ayuda del seguro (puede ser ms barato con su seguro), pero el sitio web puede darle el precio si no utiliz Tourist information centre manager.  - Puede imprimir el cupn correspondiente y llevarlo con su receta a la farmacia.  - Tambin puede pasar por nuestra oficina durante el horario de atencin regular y Education officer, museum una tarjeta de cupones de GoodRx.  - Si necesita que su receta se enve electrnicamente a una farmacia diferente, informe a nuestra oficina a travs de MyChart de [place] o por telfono llamando al [phone] y presione la opcin 4.

## 2023-06-22 DIAGNOSIS — I10 Essential (primary) hypertension: Secondary | ICD-10-CM | POA: Diagnosis not present

## 2023-06-22 DIAGNOSIS — N1831 Chronic kidney disease, stage 3a: Secondary | ICD-10-CM | POA: Diagnosis not present

## 2023-06-22 DIAGNOSIS — E7800 Pure hypercholesterolemia, unspecified: Secondary | ICD-10-CM | POA: Diagnosis not present

## 2023-06-22 DIAGNOSIS — E119 Type 2 diabetes mellitus without complications: Secondary | ICD-10-CM | POA: Diagnosis not present

## 2023-07-20 DIAGNOSIS — K5730 Diverticulosis of large intestine without perforation or abscess without bleeding: Secondary | ICD-10-CM | POA: Diagnosis not present

## 2023-07-20 DIAGNOSIS — K2970 Gastritis, unspecified, without bleeding: Secondary | ICD-10-CM | POA: Diagnosis not present

## 2023-07-20 DIAGNOSIS — K222 Esophageal obstruction: Secondary | ICD-10-CM | POA: Diagnosis not present

## 2023-07-20 DIAGNOSIS — K449 Diaphragmatic hernia without obstruction or gangrene: Secondary | ICD-10-CM | POA: Diagnosis not present

## 2023-07-20 DIAGNOSIS — K648 Other hemorrhoids: Secondary | ICD-10-CM | POA: Diagnosis not present

## 2023-07-20 DIAGNOSIS — K5520 Angiodysplasia of colon without hemorrhage: Secondary | ICD-10-CM | POA: Diagnosis not present

## 2023-07-20 DIAGNOSIS — Z800 Family history of malignant neoplasm of digestive organs: Secondary | ICD-10-CM | POA: Diagnosis not present

## 2023-07-20 DIAGNOSIS — K224 Dyskinesia of esophagus: Secondary | ICD-10-CM | POA: Diagnosis not present

## 2023-07-20 DIAGNOSIS — R1319 Other dysphagia: Secondary | ICD-10-CM | POA: Diagnosis not present

## 2023-07-21 DIAGNOSIS — R1319 Other dysphagia: Secondary | ICD-10-CM | POA: Insufficient documentation

## 2023-07-21 DIAGNOSIS — K648 Other hemorrhoids: Secondary | ICD-10-CM | POA: Insufficient documentation

## 2023-07-21 DIAGNOSIS — K2970 Gastritis, unspecified, without bleeding: Secondary | ICD-10-CM | POA: Insufficient documentation

## 2023-07-21 DIAGNOSIS — K222 Esophageal obstruction: Secondary | ICD-10-CM | POA: Insufficient documentation

## 2023-07-21 DIAGNOSIS — K224 Dyskinesia of esophagus: Secondary | ICD-10-CM | POA: Insufficient documentation

## 2023-07-21 DIAGNOSIS — K5730 Diverticulosis of large intestine without perforation or abscess without bleeding: Secondary | ICD-10-CM | POA: Insufficient documentation

## 2023-07-21 DIAGNOSIS — Z800 Family history of malignant neoplasm of digestive organs: Secondary | ICD-10-CM | POA: Insufficient documentation

## 2023-07-21 DIAGNOSIS — K5520 Angiodysplasia of colon without hemorrhage: Secondary | ICD-10-CM | POA: Insufficient documentation

## 2023-07-21 DIAGNOSIS — Z83719 Family history of colon polyps, unspecified: Secondary | ICD-10-CM | POA: Insufficient documentation

## 2023-08-13 DIAGNOSIS — E1122 Type 2 diabetes mellitus with diabetic chronic kidney disease: Secondary | ICD-10-CM | POA: Diagnosis not present

## 2023-08-13 DIAGNOSIS — M1711 Unilateral primary osteoarthritis, right knee: Secondary | ICD-10-CM | POA: Diagnosis not present

## 2023-08-13 DIAGNOSIS — Z6833 Body mass index (BMI) 33.0-33.9, adult: Secondary | ICD-10-CM | POA: Diagnosis not present

## 2023-08-31 ENCOUNTER — Other Ambulatory Visit: Payer: Self-pay | Admitting: General Surgery

## 2023-08-31 DIAGNOSIS — Z853 Personal history of malignant neoplasm of breast: Secondary | ICD-10-CM

## 2023-08-31 DIAGNOSIS — M79671 Pain in right foot: Secondary | ICD-10-CM | POA: Diagnosis not present

## 2023-09-07 DIAGNOSIS — Z0000 Encounter for general adult medical examination without abnormal findings: Secondary | ICD-10-CM | POA: Diagnosis not present

## 2023-09-07 DIAGNOSIS — Z1331 Encounter for screening for depression: Secondary | ICD-10-CM | POA: Diagnosis not present

## 2023-09-07 DIAGNOSIS — E11620 Type 2 diabetes mellitus with diabetic dermatitis: Secondary | ICD-10-CM | POA: Diagnosis not present

## 2023-09-07 DIAGNOSIS — C50211 Malignant neoplasm of upper-inner quadrant of right female breast: Secondary | ICD-10-CM | POA: Diagnosis not present

## 2023-09-07 DIAGNOSIS — E7800 Pure hypercholesterolemia, unspecified: Secondary | ICD-10-CM | POA: Diagnosis not present

## 2023-09-07 DIAGNOSIS — E1122 Type 2 diabetes mellitus with diabetic chronic kidney disease: Secondary | ICD-10-CM | POA: Diagnosis not present

## 2023-09-07 DIAGNOSIS — I129 Hypertensive chronic kidney disease with stage 1 through stage 4 chronic kidney disease, or unspecified chronic kidney disease: Secondary | ICD-10-CM | POA: Diagnosis not present

## 2023-09-07 DIAGNOSIS — K219 Gastro-esophageal reflux disease without esophagitis: Secondary | ICD-10-CM | POA: Diagnosis not present

## 2023-09-07 DIAGNOSIS — G4733 Obstructive sleep apnea (adult) (pediatric): Secondary | ICD-10-CM | POA: Diagnosis not present

## 2023-09-09 DIAGNOSIS — E119 Type 2 diabetes mellitus without complications: Secondary | ICD-10-CM | POA: Diagnosis not present

## 2023-09-28 ENCOUNTER — Inpatient Hospital Stay: Payer: Medicare PPO | Admitting: Oncology

## 2023-09-28 ENCOUNTER — Encounter: Payer: Self-pay | Admitting: Oncology

## 2023-09-28 ENCOUNTER — Inpatient Hospital Stay: Payer: Medicare PPO

## 2023-09-28 ENCOUNTER — Inpatient Hospital Stay: Payer: Medicare PPO | Attending: Oncology

## 2023-09-28 VITALS — BP 120/57 | HR 73 | Temp 97.0°F | Resp 16 | Wt 201.0 lb

## 2023-09-28 DIAGNOSIS — Z79811 Long term (current) use of aromatase inhibitors: Secondary | ICD-10-CM | POA: Insufficient documentation

## 2023-09-28 DIAGNOSIS — Z803 Family history of malignant neoplasm of breast: Secondary | ICD-10-CM | POA: Diagnosis not present

## 2023-09-28 DIAGNOSIS — Z800 Family history of malignant neoplasm of digestive organs: Secondary | ICD-10-CM | POA: Diagnosis not present

## 2023-09-28 DIAGNOSIS — Z79899 Other long term (current) drug therapy: Secondary | ICD-10-CM | POA: Insufficient documentation

## 2023-09-28 DIAGNOSIS — Z9049 Acquired absence of other specified parts of digestive tract: Secondary | ICD-10-CM | POA: Insufficient documentation

## 2023-09-28 DIAGNOSIS — M8580 Other specified disorders of bone density and structure, unspecified site: Secondary | ICD-10-CM

## 2023-09-28 DIAGNOSIS — Z170 Estrogen receptor positive status [ER+]: Secondary | ICD-10-CM | POA: Insufficient documentation

## 2023-09-28 DIAGNOSIS — G4730 Sleep apnea, unspecified: Secondary | ICD-10-CM | POA: Insufficient documentation

## 2023-09-28 DIAGNOSIS — N183 Chronic kidney disease, stage 3 unspecified: Secondary | ICD-10-CM | POA: Diagnosis not present

## 2023-09-28 DIAGNOSIS — Z8049 Family history of malignant neoplasm of other genital organs: Secondary | ICD-10-CM | POA: Insufficient documentation

## 2023-09-28 DIAGNOSIS — C50211 Malignant neoplasm of upper-inner quadrant of right female breast: Secondary | ICD-10-CM | POA: Insufficient documentation

## 2023-09-28 DIAGNOSIS — Z801 Family history of malignant neoplasm of trachea, bronchus and lung: Secondary | ICD-10-CM | POA: Insufficient documentation

## 2023-09-28 DIAGNOSIS — Z7982 Long term (current) use of aspirin: Secondary | ICD-10-CM | POA: Diagnosis not present

## 2023-09-28 DIAGNOSIS — Z85820 Personal history of malignant melanoma of skin: Secondary | ICD-10-CM | POA: Diagnosis not present

## 2023-09-28 DIAGNOSIS — I129 Hypertensive chronic kidney disease with stage 1 through stage 4 chronic kidney disease, or unspecified chronic kidney disease: Secondary | ICD-10-CM | POA: Insufficient documentation

## 2023-09-28 DIAGNOSIS — N1831 Chronic kidney disease, stage 3a: Secondary | ICD-10-CM | POA: Diagnosis not present

## 2023-09-28 DIAGNOSIS — Z1732 Human epidermal growth factor receptor 2 negative status: Secondary | ICD-10-CM | POA: Diagnosis not present

## 2023-09-28 LAB — CBC WITH DIFFERENTIAL (CANCER CENTER ONLY)
Abs Immature Granulocytes: 0.03 K/uL (ref 0.00–0.07)
Basophils Absolute: 0 K/uL (ref 0.0–0.1)
Basophils Relative: 1 %
Eosinophils Absolute: 0.1 K/uL (ref 0.0–0.5)
Eosinophils Relative: 2 %
HCT: 37.9 % (ref 36.0–46.0)
Hemoglobin: 12.8 g/dL (ref 12.0–15.0)
Immature Granulocytes: 1 %
Lymphocytes Relative: 26 %
Lymphs Abs: 1.6 K/uL (ref 0.7–4.0)
MCH: 27.5 pg (ref 26.0–34.0)
MCHC: 33.8 g/dL (ref 30.0–36.0)
MCV: 81.3 fL (ref 80.0–100.0)
Monocytes Absolute: 0.4 K/uL (ref 0.1–1.0)
Monocytes Relative: 7 %
Neutro Abs: 4 K/uL (ref 1.7–7.7)
Neutrophils Relative %: 63 %
Platelet Count: 189 K/uL (ref 150–400)
RBC: 4.66 MIL/uL (ref 3.87–5.11)
RDW: 14.4 % (ref 11.5–15.5)
WBC Count: 6.1 K/uL (ref 4.0–10.5)
nRBC: 0 % (ref 0.0–0.2)

## 2023-09-28 LAB — CMP (CANCER CENTER ONLY)
ALT: 16 U/L (ref 0–44)
AST: 13 U/L — ABNORMAL LOW (ref 15–41)
Albumin: 3.9 g/dL (ref 3.5–5.0)
Alkaline Phosphatase: 63 U/L (ref 38–126)
Anion gap: 9 (ref 5–15)
BUN: 16 mg/dL (ref 8–23)
CO2: 23 mmol/L (ref 22–32)
Calcium: 9.3 mg/dL (ref 8.9–10.3)
Chloride: 104 mmol/L (ref 98–111)
Creatinine: 0.97 mg/dL (ref 0.44–1.00)
GFR, Estimated: >60 mL/min (ref >60)
Glucose, Bld: 109 mg/dL — ABNORMAL HIGH (ref 70–99)
Potassium: 3.8 mmol/L (ref 3.5–5.1)
Sodium: 136 mmol/L (ref 135–145)
Total Bilirubin: 1.7 mg/dL — ABNORMAL HIGH (ref 0.0–1.2)
Total Protein: 6.5 g/dL (ref 6.5–8.1)

## 2023-09-28 MED ORDER — ZOLEDRONIC ACID 4 MG/5ML IV CONC
3.3000 mg | Freq: Once | INTRAVENOUS | Status: AC
  Administered 2023-09-28: 3.3 mg via INTRAVENOUS
  Filled 2023-09-28: qty 4.13

## 2023-09-28 MED ORDER — SODIUM CHLORIDE 0.9 % IV SOLN
INTRAVENOUS | Status: DC
  Filled 2023-09-28: qty 250

## 2023-09-28 NOTE — Assessment & Plan Note (Signed)
 Encourage oral hydration and avoid nephrotoxins.

## 2023-09-28 NOTE — Assessment & Plan Note (Signed)
#  Stage IA right breast cancer, s/p lumpectomy/SLNB and radiation. Labs reviewed and discussed with patient. Continue Arimidex 1 mg daily. Plan total 5 years of endocrine therapy-till January 2027 Continue annual mammogram - she gets mammogram ordered through Dr.Cintron's office.next due Sept 2025

## 2023-09-28 NOTE — Assessment & Plan Note (Addendum)
 12/29/21 DEXA showed osteopenia 10-year major osteoporotic fracture right is 15 %. Continue calcium and vitamin D supplementation Proceed with Zometa  today- 3.3mg , renal dosing Repeat DEXA i

## 2023-09-28 NOTE — Progress Notes (Signed)
 Hematology/Oncology Progress note [phone] [phone]     CHIEF COMPLAINTS/REASON FOR VISIT:  Follow up for right breast cancer  ASSESSMENT & PLAN:   Malignant neoplasm of upper-inner quadrant of right breast in female, estrogen receptor positive (HCC) #Stage IA right breast cancer, s/p lumpectomy/SLNB and radiation. Labs reviewed and discussed with patient. Continue Arimidex  1 mg daily. Plan total 5 years of endocrine therapy-till January 2027 Continue annual mammogram - she gets mammogram ordered through Dr.Cintron's office.next due Sept 2025   Aromatase inhibitor use 12/29/21 DEXA showed osteopenia 10-year major osteoporotic fracture right is 15 %. Continue calcium and vitamin D supplementation Proceed with Zometa  today- 3.3mg , renal dosing Repeat DEXA i  CKD (chronic kidney disease) stage 3, GFR 30-59 ml/min (HCC) Encourage oral hydration and avoid nephrotoxins.    Orders Placed This Encounter  Procedures   DG Bone Density    Standing Status:   Future    Expected Date:   03/08/2024    Expiration Date:   09/27/2024    Reason for Exam (SYMPTOM  OR DIAGNOSIS REQUIRED):   hx breast cancer    Preferred imaging location?:   [facility]   CBC with Differential (Cancer Center Only)    Standing Status:   Future    Expected Date:   03/30/2024    Expiration Date:   06/28/2024   CMP (Cancer Center only)    Standing Status:   Future    Expected Date:   03/30/2024    Expiration Date:   06/28/2024   Follow up in 6 months.  All questions were answered. The patient knows to call the clinic with any problems, questions or concerns.  Zelphia Cap, MD, PhD [facility] Hematology Oncology 09/28/2023   HISTORY OF PRESENTING ILLNESS:   Barbara [name] is a  73 y.o.  female presents for follow up of breast cancer  Oncology History  Malignant neoplasm of upper-inner quadrant of right breast in female, estrogen receptor positive (HCC)  10/13/2019 Mammogram    Bilateral Diagnostic mammogram  Showed 5mm spiculated mass in the right breast 1:00. Unchanged left axillary probabaly benign mass- 1 year stability. 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 ER 90% positive, PR 1-0% positive, HER2 negative.    Family history of breast cancer: Bruna Nyhan, and Bruna first cousin Family history of other cancers: lung cancer in mother, pancreatic cancer in father, unknown cancer in pat uncle.  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 VUS in BRCA2 called c.2980G>A identified on the [facility] Multi-Cancer Panel. The report date is 11/17/2019.  The Multi-Cancer Panel offered by [facility] includes sequencing and/or deletion duplication  testing of the following 85 genes: AIP, ALK, APC, ATM, AXIN2,BAP1,  BARD1, BLM, BMPR1A, BRCA1, BRCA2, BRIP1, CASR, CDC73, CDH1, CDK4, CDKN1B, CDKN1C, CDKN2A (p14ARF), CDKN2A (p16INK4a), CEBPA, CHEK2, CTNNA1, DICER1, DIS3L2, EGFR (c.2369C>T, p.Thr790Met variant only), EPCAM (Deletion/duplication testing only), FH, FLCN, GATA2, GPC3, GREM1 (Promoter  region deletion/duplication testing only), HOXB13 (c.251G>A, p.Gly84Glu), HRAS, KIT, MAX, MEN1, MET, MITF (c.952G>A, p.Glu318Lys variant only), MLH1, MSH2, MSH3, MSH6, MUTYH, NBN, NF1, NF2, NTHL1, PALB2, PDGFRA, PHOX2B, PMS2, POLD1, POLE, POT1, PRKAR1A, PTCH1, PTEN, RAD50, RAD51C, RAD51D, RB1, RECQL4, RET, RNF43, RUNX1, SDHAF2, SDHA (sequence changes only), SDHB, SDHC, SDHD, SMAD4, SMARCA4, SMARCB1, SMARCE1, STK11, SUFU, TERC, TERT, TMEM127, TP53, TSC1, TSC2, VHL, WRN and WT1.    10/30/2019 Surgery   patient underwent right lumpectomy and sentinel lymph node biopsy.  Stage IA right ER 90%/PR 1-10% positive, HER-2 negative right breast invasive mammary carcinoma, pT1a pN0 tubular carcinoma, grade 1.    01/18/2020 -  Radiation Therapy   finished adjuvant Breast radiation.    02/2020 -  Anti-estrogen oral therapy   started on Arimidex     10/30/2020 Mammogram   bilateral diagnostic mammogram showed a subtle left breast architectural distortion without suspicious sonographic correlate.   -11/06/2020, left breast upper inner stereotactic biopsy showed benign breast tissue with fibrocystic changes with ductal ectasia. Psudoangiomatous stromal hyperplasia. usual ductal hyperplasia. Negative for atypia and malignancy. Left breast upper inner posterior stereotactic biopsy showed benign breast tissue with sclerosing adenosis. Fibrocystic changes. Negative for atypia and malignancy     10/31/2021 Mammogram   Bilateral diagnostic mammogram  1. There is a 9 mm group of indeterminate amorphous calcifications in the LEFT upper outer breast at posterior depth. Recommend stereotactic guided biopsy for definitive characterization. 2. No mammographic evidence of malignancy in the RIGHT breast.  11/12/21 Left upper outer breast biopsy showed benign fibrofatty breast parenchyma with fibrocystic and fibroadenomatoid changes, and associated coarse dystrophic calcifications. Negative for atypical proliferative breast  disease    12/29/2021 Imaging   DEXA showed osteopenia FRAX major osteoporotic fracture is 15.8% within the next ten years.      INTERVAL HISTORY Barbara [name] is a 73 y.o. female who has above history reviewed by me today presents for follow up visit for management of right breast cancer. Patient reports feeling well.  She has no new complaints. Patient tolerates Arimidex  with manageable side effects.  She is on Zometa  every 6 months. She denies any new breast concerns.   Review of Systems  Constitutional:  Negative for appetite change, chills, fatigue and fever.  HENT:   Negative for hearing loss and voice change.   Eyes:  Negative for eye problems.  Respiratory:  Negative for chest tightness and cough.   Cardiovascular:  Negative for chest pain.  Gastrointestinal:  Negative for abdominal distention, abdominal pain and blood in stool.  Endocrine: Negative for hot flashes.  Genitourinary:  Negative for difficulty urinating and frequency.   Musculoskeletal:  Negative for arthralgias.  Skin:  Negative for itching and rash.  Neurological:  Negative for extremity weakness.  Hematological:  Negative for adenopathy.  Psychiatric/Behavioral:  Negative for confusion.     MEDICAL HISTORY:  Past Medical History:  Diagnosis Date   Actinic keratosis    Cancer (HCC)    Family history of breast cancer    Family history of lung cancer    Family history of pancreatic cancer    Family history of uterine cancer    GERD (gastroesophageal reflux disease)    Headache  migraines   Hypertension    Melanoma (HCC) 08/08/2020   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, R lat mid back, exc 09/18/20   Osteopenia 02/15/2020   Personal history of radiation therapy    Pre-diabetes    Sleep apnea    uses cpap    SURGICAL HISTORY: Past Surgical History:  Procedure Laterality Date   APPENDECTOMY     BREAST BIOPSY Right 10/20/2019   Affirm bx-X clip positive   BREAST BIOPSY Left 11/06/2020    stereo bx, x clip, benign   BREAST BIOPSY Left 11/06/2020   Stereo bx-Distortion #2-Coil clip-benign   BREAST BIOPSY Left 11/12/2021   stereo bx/ ribbon clip/ benign   BREAST CYST EXCISION Right    BREAST CYST EXCISION Right    BREAST CYST EXCISION Left    BREAST LUMPECTOMY Right    BREAST SURGERY     CHOLECYSTECTOMY     COLONOSCOPY WITH PROPOFOL  N/A 04/22/2023   Procedure: COLONOSCOPY WITH PROPOFOL ;  Surgeon: Onita Elspeth Sharper, DO;  Location[facility] ENDOSCOPY;  Service: Gastroenterology;  Laterality: N/A;   ESOPHAGOGASTRODUODENOSCOPY (EGD) WITH PROPOFOL  N/A 04/22/2023   Procedure: ESOPHAGOGASTRODUODENOSCOPY (EGD) WITH PROPOFOL ;  Surgeon: Onita Elspeth Sharper, DO;  Location: [facility] ENDOSCOPY;  Service: Gastroenterology;  Laterality: N/A;   PART MASTECTOMY,RADIO FREQUENCY LOCALIZER,AXILLARY SENTINEL NODE BIOPSY Right 10/30/2019   Procedure: PART MASTECTOMY,RADIO FREQUENCY LOCALIZER,AXILLARY SENTINEL NODE BIOPSY;  Surgeon: Rodolph Romano, MD;  Location: [facility] ORS;  Service: General;  Laterality: Right;   POLYPECTOMY  04/22/2023   Procedure: POLYPECTOMY;  Surgeon: Onita Elspeth Sharper, DO;  Location: [facility] ENDOSCOPY;  Service: Gastroenterology;;    SOCIAL HISTORY: Social History   Socioeconomic History   Marital status: Married    Spouse name: Not on file   Number of children: Not on file   Years of education: Not on file   Highest education level: Not on file  Occupational History   Not on file  Tobacco Use   Smoking status: Never   Smokeless tobacco: Never  Vaping Use   Vaping status: Never Used  Substance and Sexual Activity   Alcohol use: No   Drug use: No   Sexual activity: Not on file  Other Topics Concern   Not on file  Social History Narrative   Not on file   Social Drivers of Health   Financial Resource Strain: Low Risk  (09/06/2023)   Received from [facility]   Overall Financial Resource Strain (CARDIA)    Difficulty of Paying Living  Expenses: Not hard at all  Food Insecurity: No Food Insecurity (09/06/2023)   Received from [facility]   Hunger Vital Sign    Within the past 12 months, you worried that your food would run out before you got the money to buy more.: Never true    Within the past 12 months, the food you bought just didn't last and you didn't have money to get more.: Never true  Transportation Needs: No Transportation Needs (09/06/2023)   Received from [facility] - Transportation    In the past 12 months, has lack of transportation kept you from medical appointments or from getting medications?: No    Lack of Transportation (Non-Medical): No  Physical Activity: Not on file  Stress: Not on file  Social Connections: Not on file  Intimate Partner Violence: Not on file    FAMILY HISTORY: Family History  Problem Relation Age of Onset   Lung cancer Mother    Pancreatic  cancer Father    Breast cancer Daughter    Breast cancer Maternal Aunt    Breast cancer Paternal Aunt    Cancer Paternal Aunt        unk type, possibly breast   Cancer Paternal Uncle        unk type   Breast cancer Cousin    Cancer Cousin        unk type    ALLERGIES:  has no known allergies.  MEDICATIONS:  Current Outpatient Medications  Medication Sig Dispense Refill   amLODipine  (NORVASC ) 5 MG tablet Take 5 mg by mouth at bedtime.      anastrozole  (ARIMIDEX ) 1 MG tablet Take 1 tablet (1 mg total) by mouth daily. 90 tablet 3   aspirin 81 MG EC tablet Take 81 mg by mouth daily.      azelastine  (ASTELIN ) 0.1 % nasal spray Place into the nose.     Biotin 89999 MCG TABS Take 1,000 tablets by mouth 2 (two) times daily.     calcipotriene -betamethasone (TACLONEX) external suspension Apply to affected area ears at night until improved. 60 g 1   Calcium Carbonate-Vit D-Min (CALCIUM 1200 PO) Take by mouth.     cetirizine (ZYRTEC) 10 MG tablet Take 10 mg by mouth every morning.       Cholecalciferol (VITAMIN D) 50 MCG (2000 UT) CAPS Take 2,000 Units by mouth daily.     CINNAMON PO Take 1,000 mg by mouth in the morning and at bedtime.      clobetasol  cream (TEMOVATE ) 0.05 % Apply 1 application topically 2 (two) times daily. 45 g 1   Fluocinolone  Acetonide 0.01 % OIL Apply 1-2 times a day as needed to ear canals. 20 mL 11   Krill Oil 500 MG CAPS Take 500 mg by mouth daily.     lisinopril (ZESTRIL) 2.5 MG tablet Take 2.5 mg by mouth daily.     lovastatin  (MEVACOR ) 40 MG tablet Take 40 mg by mouth at bedtime.      mometasone  (ELOCON ) 0.1 % cream APPLY TO THE AFFECTED AREAS OF EARS DAILY AS NEEDED FOR RASH FOR FLARES 45 g 0   pantoprazole  (PROTONIX ) 40 MG tablet Take 40 mg by mouth every morning.      Efinaconazole  (JUBLIA ) 10 % SOLN Apply a thin coat to the nails QHS. 4 mL 11   fluconazole  (DIFLUCAN ) 200 MG tablet Take 1 tablet (200 mg total) by mouth daily. (Patient not taking: Reported on 09/28/2023) 15 tablet 0   MAGNESIUM GLYCINATE PO Take 200 mg by mouth.     meloxicam  (MOBIC ) 15 MG tablet Take 1 tablet by mouth daily.     No current facility-administered medications for this visit.   Facility-Administered Medications Ordered in Other Visits  Medication Dose Route Frequency Provider Last Rate Last Admin   0.9 %  sodium chloride  infusion   Intravenous Continuous Babara Call, MD   Stopped at 09/28/23 1429     PHYSICAL EXAMINATION: ECOG PERFORMANCE STATUS: 0 - Asymptomatic Vitals:   09/28/23 1322  BP: (!) 120/57  Pulse: 73  Resp: 16  Temp: (!) 97 F (36.1 C)  SpO2: 95%   Filed Weights   09/28/23 1322  Weight: 201 lb (91.2 kg)    Physical Exam Constitutional:      General: She is not in acute distress. HENT:     Head: Normocephalic and atraumatic.  Eyes:     General: No scleral icterus. Cardiovascular:     Rate and Rhythm:  Normal rate and regular rhythm.     Heart sounds: Normal heart sounds.  Pulmonary:     Effort: Pulmonary effort is normal. No  respiratory distress.     Breath sounds: No wheezing.  Abdominal:     General: Bowel sounds are normal. There is no distension.     Palpations: Abdomen is soft.  Musculoskeletal:        General: No deformity. Normal range of motion.     Cervical back: Normal range of motion and neck supple.  Skin:    General: Skin is warm and dry.     Findings: No erythema or rash.  Neurological:     Mental Status: She is alert and oriented to person, place, and time. Mental status is at baseline.     Cranial Nerves: No cranial nerve deficit.     Coordination: Coordination normal.  Psychiatric:        Mood and Affect: Mood normal.      LABORATORY DATA:  I have reviewed the data as listed     Latest Ref Rng & Units 09/28/2023   12:47 PM 03/30/2023   11:06 AM 09/23/2022   12:55 PM  CBC  WBC 4.0 - 10.5 K/uL 6.1  6.1  7.4   Hemoglobin 12.0 - 15.0 g/dL 87.1  87.0  86.4   Hematocrit 36.0 - 46.0 % 37.9  40.1  42.0   Platelets 150 - 400 K/uL 189  183  231       Latest Ref Rng & Units 09/28/2023   12:47 PM 03/30/2023   11:07 AM 09/23/2022   12:55 PM  CMP  Glucose 70 - 99 mg/dL 890  892  872   BUN 8 - 23 mg/dL 16  15  14    Creatinine 0.44 - 1.00 mg/dL 9.02  8.94  8.94   Sodium 135 - 145 mmol/L 136  140  139   Potassium 3.5 - 5.1 mmol/L 3.8  4.1  3.5   Chloride 98 - 111 mmol/L 104  106  106   CO2 22 - 32 mmol/L 23  26  23    Calcium 8.9 - 10.3 mg/dL 9.3  9.4  9.4   Total Protein 6.5 - 8.1 g/dL 6.5  6.9  7.4   Total Bilirubin 0.0 - 1.2 mg/dL 1.7  1.2  1.1   Alkaline Phos 38 - 126 U/L 63  64  79   AST 15 - 41 U/L 13  14  18    ALT 0 - 44 U/L 16  13  16     RADIOGRAPHIC STUDIES: I have personally reviewed the radiological images as listed and agreed with the findings in the report. No results found.

## 2023-09-30 DIAGNOSIS — N183 Chronic kidney disease, stage 3 unspecified: Secondary | ICD-10-CM | POA: Diagnosis not present

## 2023-09-30 DIAGNOSIS — M79671 Pain in right foot: Secondary | ICD-10-CM | POA: Diagnosis not present

## 2023-09-30 DIAGNOSIS — M7661 Achilles tendinitis, right leg: Secondary | ICD-10-CM | POA: Diagnosis not present

## 2023-10-11 DIAGNOSIS — M1711 Unilateral primary osteoarthritis, right knee: Secondary | ICD-10-CM | POA: Diagnosis not present

## 2023-10-18 DIAGNOSIS — M1711 Unilateral primary osteoarthritis, right knee: Secondary | ICD-10-CM | POA: Diagnosis not present

## 2023-10-25 DIAGNOSIS — Z6833 Body mass index (BMI) 33.0-33.9, adult: Secondary | ICD-10-CM | POA: Diagnosis not present

## 2023-10-25 DIAGNOSIS — E1122 Type 2 diabetes mellitus with diabetic chronic kidney disease: Secondary | ICD-10-CM | POA: Diagnosis not present

## 2023-10-25 DIAGNOSIS — M1711 Unilateral primary osteoarthritis, right knee: Secondary | ICD-10-CM | POA: Diagnosis not present

## 2023-11-02 ENCOUNTER — Other Ambulatory Visit: Payer: Self-pay | Admitting: Podiatry

## 2023-11-02 DIAGNOSIS — R600 Localized edema: Secondary | ICD-10-CM | POA: Diagnosis not present

## 2023-11-02 DIAGNOSIS — M7661 Achilles tendinitis, right leg: Secondary | ICD-10-CM | POA: Diagnosis not present

## 2023-11-03 ENCOUNTER — Ambulatory Visit
Admission: RE | Admit: 2023-11-03 | Discharge: 2023-11-03 | Disposition: A | Discharge status: Home / Self Care | Source: Ambulatory Visit | Attending: General Surgery | Admitting: General Surgery

## 2023-11-03 ENCOUNTER — Ambulatory Visit

## 2023-11-03 DIAGNOSIS — R921 Mammographic calcification found on diagnostic imaging of breast: Secondary | ICD-10-CM | POA: Diagnosis not present

## 2023-11-03 DIAGNOSIS — Z853 Personal history of malignant neoplasm of breast: Secondary | ICD-10-CM | POA: Diagnosis not present

## 2023-11-03 DIAGNOSIS — R92333 Mammographic heterogeneous density, bilateral breasts: Secondary | ICD-10-CM | POA: Diagnosis not present

## 2023-11-08 ENCOUNTER — Encounter: Payer: Self-pay | Admitting: Oncology

## 2023-11-08 ENCOUNTER — Ambulatory Visit
Admission: RE | Admit: 2023-11-08 | Discharge: 2023-11-08 | Disposition: A | Discharge status: Home / Self Care | Source: Ambulatory Visit | Attending: Podiatry | Admitting: Podiatry

## 2023-11-08 DIAGNOSIS — R600 Localized edema: Secondary | ICD-10-CM | POA: Insufficient documentation

## 2023-11-11 DIAGNOSIS — C50211 Malignant neoplasm of upper-inner quadrant of right female breast: Secondary | ICD-10-CM | POA: Diagnosis not present

## 2023-11-12 ENCOUNTER — Encounter (INDEPENDENT_AMBULATORY_CARE_PROVIDER_SITE_OTHER): Payer: Self-pay | Admitting: Vascular Surgery

## 2023-11-12 ENCOUNTER — Ambulatory Visit (INDEPENDENT_AMBULATORY_CARE_PROVIDER_SITE_OTHER): Admitting: Vascular Surgery

## 2023-11-12 VITALS — BP 114/68 | HR 87 | Resp 18 | Ht 65.0 in | Wt 206.0 lb

## 2023-11-12 DIAGNOSIS — I10 Essential (primary) hypertension: Secondary | ICD-10-CM | POA: Diagnosis not present

## 2023-11-12 DIAGNOSIS — E782 Mixed hyperlipidemia: Secondary | ICD-10-CM | POA: Diagnosis not present

## 2023-11-12 DIAGNOSIS — E66811 Obesity, class 1: Secondary | ICD-10-CM

## 2023-11-12 DIAGNOSIS — K219 Gastro-esophageal reflux disease without esophagitis: Secondary | ICD-10-CM

## 2023-11-12 DIAGNOSIS — I890 Lymphedema, not elsewhere classified: Secondary | ICD-10-CM

## 2023-11-12 DIAGNOSIS — N1831 Chronic kidney disease, stage 3a: Secondary | ICD-10-CM | POA: Diagnosis not present

## 2023-11-12 NOTE — Progress Notes (Signed)
 Subjective:    Patient ID: Barbara [name], female    DOB: 1950/12/27, 73 y.o.   MRN: [id number] Chief Complaint  Patient presents with   Establish Care    New patient consult lower extermity edema, right greater than left.     Alyss Granato is a 73 yo female who presents to clinic today with chief complaint of bilateral lower extremity edema right greater than left with an area behind her right knee with varicose veins that can be painful.  Patient endorses she has had these varicose veins in this painful area for over a year now.  She states it comes and goes depending upon her activity level and how often she is standing.  Swelling to her lower extremities reacts the same way.  She endorses that after a good night sleep her legs are less swollen early in the morning.  She is currently taking amlodipine  which may be contributing to her swelling.  She needs this for her best blood pressure control.  She has not tried any conventional therapy at this time such as compression socks, rest, elevation and exercise.    Review of Systems  Constitutional: Negative.   Cardiovascular:  Positive for leg swelling.  Musculoskeletal:  Positive for myalgias.  All other systems reviewed and are negative.      Objective:   Physical Exam Constitutional:      Appearance: Normal appearance. She is obese.  HENT:     Head: Normocephalic.  Eyes:     Pupils: Pupils are equal, round, and reactive to light.  Cardiovascular:     Rate and Rhythm: Normal rate and regular rhythm.     Pulses: Normal pulses.     Heart sounds: Normal heart sounds.  Pulmonary:     Effort: Pulmonary effort is normal.     Breath sounds: Normal breath sounds.  Abdominal:     General: Bowel sounds are normal.     Palpations: Abdomen is soft.  Musculoskeletal:        General: Swelling and tenderness present.     Comments: Tenderness to behind right knee with varicose veins that are painful  Skin:    General: Skin is warm and  dry.     Capillary Refill: Capillary refill takes 2 to 3 seconds.  Neurological:     General: No focal deficit present.     Mental Status: She is alert and oriented to person, place, and time. Mental status is at baseline.  Psychiatric:        Mood and Affect: Mood normal.        Behavior: Behavior normal.        Thought Content: Thought content normal.        Judgment: Judgment normal.     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, R lat mid back, exc 09/18/20  Osteopenia 02/15/2020   Personal history of radiation therapy    Pre-diabetes    Sleep apnea    uses cpap    Social History   Socioeconomic History   Marital status: Married    Spouse name: Not on file   Number of children: Not on file   Years of education: Not on file   Highest education level: Not on file  Occupational History   Not on file  Tobacco Use   Smoking status: Never   Smokeless tobacco: Never  Vaping Use   Vaping status: Never Used  Substance and Sexual Activity   Alcohol use: No   Drug use: No   Sexual activity: Not on file  Other Topics Concern   Not on file  Social History Narrative   Not on file   Social Drivers of Health   Financial Resource Strain: Low Risk  (09/06/2023)   Received from [facility]   Overall Financial Resource Strain (CARDIA)    Difficulty of Paying Living Expenses: Not hard at all  Food Insecurity: No Food Insecurity (09/06/2023)   [facility]   Hunger Vital Sign    Within the past 12 months, you worried that your food would run out before you got the money to buy more.: Never true    Within the past 12 months, the food you bought just didn't last and you didn't have money to get more.: Never true  Transportation Needs: No Transportation Needs (09/06/2023)   Received from [facility] - Transportation    In the past 12 months, has lack of transportation kept you from medical appointments or from getting medications?: No    Lack of Transportation (Non-Medical): No  Physical Activity: Not on file  Stress: Not on file  Social Connections: Not on file  Intimate Partner Violence: Not on file    Past Surgical History:  Procedure Laterality Date   APPENDECTOMY     BREAST BIOPSY Right 10/20/2019   Affirm bx-X clip positive   BREAST BIOPSY Left 11/06/2020   stereo bx, x clip, benign   BREAST BIOPSY Left 11/06/2020   Stereo bx-Distortion #2-Coil clip-benign   BREAST BIOPSY Left 11/12/2021   stereo bx/ ribbon clip/ benign   BREAST CYST EXCISION Right    BREAST CYST EXCISION Right    BREAST CYST EXCISION Left    BREAST LUMPECTOMY Right    BREAST SURGERY     CHOLECYSTECTOMY     COLONOSCOPY WITH PROPOFOL  N/A 04/22/2023   Procedure: COLONOSCOPY WITH PROPOFOL ;  Surgeon: Onita Elspeth Sharper, DO;  Location: [facility] ENDOSCOPY;  Service: Gastroenterology;  Laterality: N/A;   ESOPHAGOGASTRODUODENOSCOPY (EGD) WITH PROPOFOL  N/A 04/22/2023   Procedure: ESOPHAGOGASTRODUODENOSCOPY (EGD) WITH PROPOFOL ;  Surgeon: Onita Elspeth Sharper, DO;  Location: [facility] ENDOSCOPY;  Service: Gastroenterology;  Laterality: N/A;   PART MASTECTOMY,RADIO FREQUENCY LOCALIZER,AXILLARY SENTINEL NODE BIOPSY Right 10/30/2019   Procedure: PART MASTECTOMY,RADIO FREQUENCY LOCALIZER,AXILLARY SENTINEL NODE BIOPSY;  Surgeon: Rodolph Romano, MD;  Location: [facility] ORS;  Service: General;  Laterality: Right;   POLYPECTOMY  04/22/2023    Procedure: POLYPECTOMY;  Surgeon: Onita Elspeth Sharper, DO;  Location: [facility] ENDOSCOPY;  Service: Gastroenterology;;    Family History  Problem Relation Age of Onset   Lung cancer Mother    Pancreatic cancer Father    Breast cancer Daughter    Breast cancer Maternal Aunt    Breast cancer Paternal Aunt    Cancer Paternal Aunt  unk type, possibly breast   Cancer Paternal Uncle        unk type   Breast cancer Cousin    Cancer Cousin        unk type    No Known Allergies     Latest Ref Rng & Units 09/28/2023   12:47 PM 03/30/2023   11:06 AM 09/23/2022   12:55 PM  CBC  WBC 4.0 - 10.5 K/uL 6.1  6.1  7.4   Hemoglobin 12.0 - 15.0 g/dL 87.1  87.0  86.4   Hematocrit 36.0 - 46.0 % 37.9  40.1  42.0   Platelets 150 - 400 K/uL 189  183  231       CMP     Component Value Date/Time   NA 136 09/28/2023 1247   K 3.8 09/28/2023 1247   CL 104 09/28/2023 1247   CO2 23 09/28/2023 1247   GLUCOSE 109 (H) 09/28/2023 1247   BUN 16 09/28/2023 1247   CREATININE 0.97 09/28/2023 1247   CALCIUM 9.3 09/28/2023 1247   PROT 6.5 09/28/2023 1247   ALBUMIN 3.9 09/28/2023 1247   AST 13 (L) 09/28/2023 1247   ALT 16 09/28/2023 1247   ALKPHOS 63 09/28/2023 1247   BILITOT 1.7 (H) 09/28/2023 1247   GFRNONAA >60 09/28/2023 1247     No results found.     Assessment & Plan:   1. Lymphedema (Primary) Recommend:  I have had a long discussion with the patient regarding swelling and why it  causes symptoms.  Patient will begin wearing graduated compression on a daily basis a prescription was given. The patient will  wear the stockings first thing in the morning and removing them in the evening. The patient is instructed specifically not to sleep in the stockings.   In addition, behavioral modification will be initiated.  This will include frequent elevation, use of over the counter pain medications and exercise such as walking.  Consideration for a lymph pump will also be made based upon the  effectiveness of conservative therapy.  This would help to improve the edema control and prevent sequela such as ulcers and infections   Patient should undergo duplex ultrasound of the venous system to ensure that DVT or reflux is not present.  The patient will follow-up with me after the ultrasound.   2. Hypertension, essential, benign Continue antihypertensive medications as already ordered, these medications have been reviewed and there are no changes at this time.  3. Gastroesophageal reflux disease without esophagitis Continue PPI as already ordered, this medication has been reviewed and there are no changes at this time.  Avoidence of caffeine and alcohol  Moderate elevation of the head of the bed   4. Stage 3a chronic kidney disease (HCC) Discussed with patient today effects of chronic kidney disease swelling to lower extremities.  Instructed the patient on diet that are high in salt that we will have her retain fluid and water and increase leg swelling.  Patient encouraged to continue to take hypertensive medications to help decrease any kidney damage.  5. Mixed hyperlipidemia Continue statin as ordered and reviewed, no changes at this time  6. Obesity (BMI 30.0-34.9) I did detailed discussion with the patient today greater than 15 minutes regarding her weight and how this is affecting the swelling extremities.  I encouraged her to follow-up with her PCP I discussed the new weight loss drugs on the market to see if this will be a good fit for her and assist her in  losing weight she needs this.   Current Outpatient Medications on File Prior to Visit  Medication Sig Dispense Refill   amLODipine  (NORVASC ) 5 MG tablet Take 5 mg by mouth at bedtime.      anastrozole  (ARIMIDEX ) 1 MG tablet Take 1 tablet (1 mg total) by mouth daily. 90 tablet 3   aspirin 81 MG EC tablet Take 81 mg by mouth daily.      azelastine  (ASTELIN ) 0.1 % nasal spray Place into the nose.     Biotin 89999 MCG  TABS Take 1,000 tablets by mouth 2 (two) times daily.     calcipotriene -betamethasone (TACLONEX) external suspension Apply to affected area ears at night until improved. 60 g 1   Calcium Carbonate-Vit D-Min (CALCIUM 1200 PO) Take by mouth.     cetirizine (ZYRTEC) 10 MG tablet Take 10 mg by mouth every morning.      Cholecalciferol (VITAMIN D) 50 MCG (2000 UT) CAPS Take 2,000 Units by mouth daily.     CINNAMON PO Take 1,000 mg by mouth in the morning and at bedtime.      clobetasol  cream (TEMOVATE ) 0.05 % Apply 1 application topically 2 (two) times daily. 45 g 1   Efinaconazole  (JUBLIA ) 10 % SOLN Apply a thin coat to the nails QHS. 4 mL 11   fluconazole  (DIFLUCAN ) 200 MG tablet Take 1 tablet (200 mg total) by mouth daily. 15 tablet 0   Fluocinolone  Acetonide 0.01 % OIL Apply 1-2 times a day as needed to ear canals. 20 mL 11   Krill Oil 500 MG CAPS Take 500 mg by mouth daily.     lisinopril (ZESTRIL) 2.5 MG tablet Take 2.5 mg by mouth daily.     lovastatin  (MEVACOR ) 40 MG tablet Take 40 mg by mouth at bedtime.      MAGNESIUM GLYCINATE PO Take 200 mg by mouth.     meloxicam  (MOBIC ) 15 MG tablet Take 1 tablet by mouth daily.     mometasone  (ELOCON ) 0.1 % cream APPLY TO THE AFFECTED AREAS OF EARS DAILY AS NEEDED FOR RASH FOR FLARES 45 g 0   pantoprazole  (PROTONIX ) 40 MG tablet Take 40 mg by mouth every morning.      No current facility-administered medications on file prior to visit.    There are no Patient Instructions on file for this visit. No follow-ups on file.   Gwendlyn JONELLE Shank, NP

## 2023-11-17 ENCOUNTER — Encounter (INDEPENDENT_AMBULATORY_CARE_PROVIDER_SITE_OTHER): Payer: Self-pay | Admitting: Vascular Surgery

## 2023-11-21 ENCOUNTER — Other Ambulatory Visit: Payer: Self-pay | Admitting: Oncology

## 2023-11-22 ENCOUNTER — Encounter: Payer: Self-pay | Admitting: Oncology

## 2023-12-28 ENCOUNTER — Ambulatory Visit: Admitting: Dermatology

## 2023-12-28 ENCOUNTER — Encounter: Payer: Self-pay | Admitting: Dermatology

## 2023-12-28 DIAGNOSIS — L578 Other skin changes due to chronic exposure to nonionizing radiation: Secondary | ICD-10-CM | POA: Diagnosis not present

## 2023-12-28 DIAGNOSIS — Z872 Personal history of diseases of the skin and subcutaneous tissue: Secondary | ICD-10-CM

## 2023-12-28 DIAGNOSIS — Z1283 Encounter for screening for malignant neoplasm of skin: Secondary | ICD-10-CM | POA: Diagnosis not present

## 2023-12-28 DIAGNOSIS — L814 Other melanin hyperpigmentation: Secondary | ICD-10-CM

## 2023-12-28 DIAGNOSIS — W908XXA Exposure to other nonionizing radiation, initial encounter: Secondary | ICD-10-CM | POA: Diagnosis not present

## 2023-12-28 DIAGNOSIS — D2271 Melanocytic nevi of right lower limb, including hip: Secondary | ICD-10-CM

## 2023-12-28 DIAGNOSIS — D2261 Melanocytic nevi of right upper limb, including shoulder: Secondary | ICD-10-CM | POA: Diagnosis not present

## 2023-12-28 DIAGNOSIS — D225 Melanocytic nevi of trunk: Secondary | ICD-10-CM

## 2023-12-28 DIAGNOSIS — D1801 Hemangioma of skin and subcutaneous tissue: Secondary | ICD-10-CM

## 2023-12-28 DIAGNOSIS — Z86006 Personal history of melanoma in-situ: Secondary | ICD-10-CM

## 2023-12-28 DIAGNOSIS — L821 Other seborrheic keratosis: Secondary | ICD-10-CM

## 2023-12-28 DIAGNOSIS — D2262 Melanocytic nevi of left upper limb, including shoulder: Secondary | ICD-10-CM

## 2023-12-28 DIAGNOSIS — D229 Melanocytic nevi, unspecified: Secondary | ICD-10-CM

## 2023-12-28 DIAGNOSIS — L720 Epidermal cyst: Secondary | ICD-10-CM

## 2023-12-28 NOTE — Patient Instructions (Addendum)
 Recommend starting moisturizer with exfoliant (Urea, Salicylic acid, or Lactic acid) one to two times daily to help smooth rough and bumpy skin.  OTC options include Cetaphil Rough and Bumpy lotion (Urea), Eucerin Roughness Relief lotion or spot treatment cream (Urea), CeraVe SA lotion/cream for Rough and Bumpy skin (Sal Acid), Gold Bond Rough and Bumpy cream (Sal Acid), and AmLactin 12% lotion/cream (Lactic Acid).  If applying in morning, also apply sunscreen to sun-exposed areas, since these exfoliating moisturizers can increase sensitivity to sun.   Due to recent changes in healthcare laws, you may see results of your pathology and/or laboratory studies on MyChart before the doctors have had a chance to review them. We understand that in some cases there may be results that are confusing or concerning to you. Please understand that not all results are received at the same time and often the doctors may need to interpret multiple results in order to provide you with the best plan of care or course of treatment. Therefore, we ask that you please give us  2 business days to thoroughly review all your results before contacting the office for clarification. Should we see a critical lab result, you will be contacted sooner.   If You Need Anything After Your Visit  If you have any questions or concerns for your doctor, please call our main line at [phone] and press option 4 to reach your doctor's medical assistant. If no one answers, please leave a voicemail as directed and we will return your call as soon as possible. Messages left after 4 pm will be answered the following business day.   You may also send us  a message via MyChart. We typically respond to MyChart messages within 1-2 business days.  For prescription refills, please ask your pharmacy to contact our office. Our fax number is [phone].  If you have an urgent issue when the clinic is closed that cannot wait until the next business day,  you can page your doctor at the number below.    Please note that while we do our best to be available for urgent issues outside of office hours, we are not available 24/7.   If you have an urgent issue and are unable to reach us , you may choose to seek medical care at your doctor's office, retail clinic, urgent care center, or emergency room.  If you have a medical emergency, please immediately call 911 or go to the emergency department.  Pager Numbers  - Dr. Hester: [phone]  - Dr. Jackquline: [phone]  - Dr. Claudene: [phone]   - Dr. Raymund: [phone]  In the event of inclement weather, please call our main line at [phone] for an update on the status of any delays or closures.  Dermatology Medication Tips: Please keep the boxes that topical medications come in in order to help keep track of the instructions about where and how to use these. Pharmacies typically print the medication instructions only on the boxes and not directly on the medication tubes.   If your medication is too expensive, please contact our office at [phone] option 4 or send us  a message through MyChart.   We are unable to tell what your co-pay for medications will be in advance as this is different depending on your insurance coverage. However, we may be able to find a substitute medication at lower cost or fill out paperwork to get insurance to cover a needed medication.   If a prior authorization is required to get your medication covered  by your insurance company, please allow us  1-2 business days to complete this process.  Drug prices often vary depending on where the prescription is filled and some pharmacies may offer cheaper prices.  The website www.goodrx.com contains coupons for medications through different pharmacies. The prices here do not account for what the cost may be with help from insurance (it may be cheaper with your insurance), but the website can give you the price if  you did not use any insurance.  - You can print the associated coupon and take it with your prescription to the pharmacy.  - You may also stop by our office during regular business hours and pick up a GoodRx coupon card.  - If you need your prescription sent electronically to a different pharmacy, notify our office through [facility] or by phone at [phone] option 4.     Si Usted Necesita Algo Despus de Su Visita  Tambin puede enviarnos un mensaje a travs de Clinical Cytogeneticist. Por lo general respondemos a los mensajes de MyChart en el transcurso de 1 a 2 das hbiles.  Para renovar recetas, por favor pida a su farmacia que se ponga en contacto con nuestra oficina. Randi lakes de fax es [place] [phone].  Si tiene un asunto urgente cuando la clnica est cerrada y que no puede esperar hasta el siguiente da hbil, puede llamar/localizar a su doctor(a) al nmero que aparece a continuacin.   Por favor, tenga en cuenta que aunque hacemos todo lo posible para estar disponibles para asuntos urgentes fuera del horario de [place], no estamos disponibles las 24 horas del da, los 7 [address]  [phone] de la [place].   Si tiene un problema urgente y no puede comunicarse con nosotros, puede optar por buscar atencin mdica  en el consultorio de su doctor(a), en una clnica privada, en un centro de atencin urgente o en una sala de emergencias.  Si tiene engineer, drilling, por favor llame inmediatamente al 911 o vaya a la sala de emergencias.  Nmeros de bper  - Dr. Hester: [phone]  - Dra. Jackquline: [phone]  - Dr. Claudene: [phone]  - Dra. Kitts: [phone]  En caso de inclemencias del [place], por favor llame a nuestra lnea principal al [phone] para una actualizacin sobre el estado de cualquier retraso o cierre.  Consejos para la medicacin en dermatologa: Por favor, guarde las cajas en las que vienen los medicamentos de uso tpico para ayudarle a seguir las instrucciones  sobre dnde y cmo usarlos. Las farmacias generalmente imprimen las instrucciones del medicamento slo en las cajas y no directamente en los tubos del [place].   Si su medicamento es muy caro, por favor, pngase en contacto con landry rieger llamando al [phone] y presione la opcin 4 o envenos un mensaje a travs de Clinical Cytogeneticist.   No podemos decirle cul ser su copago por los medicamentos por adelantado ya que esto es diferente dependiendo de la cobertura de su seguro. Sin embargo, es posible que podamos encontrar un medicamento sustituto a audiological scientist un formulario para que el seguro cubra el medicamento que se considera necesario.   Si se requiere una autorizacin previa para que su compaa de seguros cubra su medicamento, por favor permtanos de 1 a 2 das hbiles para completar este proceso.  Los precios de los medicamentos varan con frecuencia dependiendo del environmental consultant de dnde se surte la receta y alguna farmacias pueden ofrecer precios ms baratos.  El sitio web www.goodrx.com tiene cupones para medicamentos de [facility]  farmacias. Los precios aqu no tienen en cuenta lo que podra costar con la ayuda del seguro (puede ser ms barato con su seguro), pero el sitio web puede darle el precio si no utiliz tourist information centre manager.  - Puede imprimir el cupn correspondiente y llevarlo con su receta a la farmacia.  - Tambin puede pasar por nuestra oficina durante el horario de atencin regular y education officer, museum una tarjeta de cupones de GoodRx.  - Si necesita que su receta se enve electrnicamente a una farmacia diferente, informe a nuestra oficina a travs de MyChart de [place] o por telfono llamando al [phone] y presione la opcin 4.

## 2023-12-28 NOTE — Progress Notes (Signed)
 Follow-Up Visit   Subjective  Barbara [name] is a 73 y.o. female who presents for the following: Skin Cancer Screening and Full Body Skin Exam, hx of Melanoma IS, AKs  The patient presents for Total-Body Skin Exam (TBSE) for skin cancer screening and mole check. The patient has spots, moles and lesions to be evaluated, some may be new or changing and the patient may have concern these could be cancer.    The following portions of the chart were reviewed this encounter and updated as appropriate: medications, allergies, medical history  Review of Systems:  No other skin or systemic complaints except as noted in HPI or Assessment and Plan.  Objective  Well appearing patient in no apparent distress; mood and affect are within normal limits.  A full examination was performed including scalp, head, eyes, ears, nose, lips, neck, chest, axillae, abdomen, back, buttocks, bilateral upper extremities, bilateral lower extremities, hands, feet, fingers, toes, fingernails, and toenails. All findings within normal limits unless otherwise noted below.   Relevant physical exam findings are noted in the Assessment and Plan.    Assessment & Plan   SKIN CANCER SCREENING PERFORMED TODAY.  ACTINIC DAMAGE - Chronic condition, secondary to cumulative UV/sun exposure - diffuse scaly erythematous macules with underlying dyspigmentation - Recommend daily broad spectrum sunscreen SPF 30+ to sun-exposed areas, reapply every 2 hours as needed.  - Staying in the shade or wearing long sleeves, sun glasses (UVA+UVB protection) and wide brim hats (4-inch brim around the entire circumference of the hat) are also recommended for sun protection.  - Call for new or changing lesions.  LENTIGINES, SEBORRHEIC KERATOSES, HEMANGIOMAS - Benign normal skin lesions - Benign-appearing - Call for any changes  MELANOCYTIC NEVI - Tan-brown and/or pink-flesh-colored symmetric macules and papules - R med pretibia - 3 mm  brown macule - R mid back - 2.5 mm med brown macule - R hand dorsum 2 mm gray brown macule  Vs SK - R post thigh 4 mm brown macule with central clearing  - L upper arm flesh brown 2 tone papule 4 mm - Benign appearing on exam today, Stable compared to previous visit.  - Observation - Call clinic for new or changing moles - Recommend daily use of broad spectrum spf 30+ sunscreen to sun-exposed areas.     HISTORY OF MELANOMA IN SITU Lentigo Maligna type - No evidence of recurrence today- R lat mid back exc 09/18/20 - Recommend regular full body skin exams - Recommend daily broad spectrum sunscreen SPF 30+ to sun-exposed areas, reapply every 2 hours as needed.  - Call if any new or changing lesions are noted between office visits    SEBORRHEIC KERATOSIS R paranasal Exam: Waxy light tan patch with mild erythema R paranasal  Treatment Plan: Recommend starting moisturizer with exfoliant (Urea, Salicylic acid, or Lactic acid) one to two times daily to help smooth rough and bumpy skin.  OTC options include Cetaphil Rough and Bumpy lotion (Urea), Eucerin Roughness Relief lotion or spot treatment cream (Urea), CeraVe SA lotion/cream for Rough and Bumpy skin (Sal Acid), Gold Bond Rough and Bumpy cream (Sal Acid), and AmLactin 12% lotion/cream (Lactic Acid).  If applying in morning, also apply sunscreen to sun-exposed areas, since these exfoliating moisturizers can increase sensitivity to sun.   Milia R lower lip edge 3.45mm firm white papule - tiny firm white papules - type of cyst - benign - sometimes these will clear with nightly OTC adapalene/Differin 0.1% gel or retinol. - may  be extracted if symptomatic - observe  HISTORY OF PRECANCEROUS ACTINIC KERATOSIS - site(s) of PreCancerous Actinic Keratosis clear today. - these may recur and new lesions may form requiring treatment to prevent transformation into skin cancer - observe for new or changing spots and contact [facility]  for appointment if occur - photoprotection with sun protective clothing; sunglasses and broad spectrum sunscreen with SPF of at least 30 + and frequent self skin exams recommended - yearly exams by a dermatologist recommended for persons with history of PreCancerous Actinic Keratoses    Return in about 6 months (around 06/26/2024) for TBSE, hx of Melanoma IS, hx of AKs.  I, Grayce Saunas, RMA, am acting as scribe for Rexene Rattler, MD .   Documentation: I have reviewed the above documentation for accuracy and completeness, and I agree with the above.  Rexene Rattler, MD

## 2024-01-04 DIAGNOSIS — M1711 Unilateral primary osteoarthritis, right knee: Secondary | ICD-10-CM | POA: Diagnosis not present

## 2024-01-07 NOTE — Discharge Instructions (Signed)
 Instructions after Total Knee Replacement   Barbara [name], Jr., M.D.    Dept. of Orthopaedics & Sports Medicine [facility] [address] [place], [place]  [address]  Phone: [phone]   Fax: [phone]       [email or web address]       DIET: Drink plenty of non-alcoholic fluids. Resume your normal diet. Include foods high in fiber.  ACTIVITY:  You may use crutches or a walker with weight-bearing as tolerated, unless instructed otherwise. You may be weaned off of the walker or crutches by your Physical Therapist.  Do NOT place pillows under the knee. Anything placed under the knee could limit your ability to straighten the knee.   Use the Bone Foam 3 times a day for 30 minutes each session to help straighten the knee. Continue doing gentle exercises. Exercising will reduce the pain and swelling, increase motion, and prevent muscle weakness.   Please continue to use the TED compression stockings for 6 weeks. You may remove the stockings at night, but should reapply them in the morning. Do not drive or operate any equipment until instructed.  WOUND CARE:  The initial dressing (Aquacel) can remain in place for 7 days (see separate instructions). Continue to use the PolarCare or ice packs periodically to reduce pain and swelling. You may bathe or shower after the staples are removed at the first office visit following surgery.  MEDICATIONS: You may resume your regular medications. Please take the pain medication as prescribed on the medication. Do not take pain medication on an empty stomach. Unless instructed otherwise, you should take an enteric-coated aspirin  81 mg. TWICE a day. (This along with elevation will help reduce the possibility of blood clots/phlebitis in your operated leg.) Use a stool softener (such as Senokot-S or Colace) daily and a laxative (such as Miralax  or Dulcolax) as needed to prevent constipation.  Do not drive or drink alcoholic beverages when  taking pain medications.  CALL THE OFFICE FOR: Temperature above 101 degrees Excessive bleeding or drainage on the dressing. Excessive swelling, coldness, or paleness of the toes. Persistent nausea and vomiting.  FOLLOW-UP:  You should have an appointment to return to the office in 10-14 days after surgery. Arrangements have been made for continuation of Physical Therapy (either home therapy or outpatient therapy).     [facility] Department Directory         [email or web address]       [email or web address]          Cardiology  Appointments: [place] - [phone]  Endocrinology  Appointments: [place] [phone] [place] - [phone]  Gastroenterology  Appointments: [place] [phone] [place] - [phone]        General Surgery   Appointments: [facility]  Internal Medicine/Family Medicine  Appointments: [facility] - [phone] [place] - [phone]  Metabolic and Weigh Loss Surgery  Appointments: [facility]        Neurology  Appointments: [place] [phone] [place] - [phone]  Neurosurgery  Appointments: [place]  Obstetrics & Gynecology  Appointments: [place] [phone] [place] - [phone]        Pediatrics  Appointments: [place] [phone] [place] - [phone]  Physiatry  Appointments: [place] [phone]  Physical Therapy  Appointments: [place] - [phone]        Podiatry  Appointments: [place] [phone] [place] - [phone]  Pulmonology  Appointments: [place]  Rheumatology  Appointments: [place] [phone]  [place] Location: [facility]  [address] [place], [place]  [address]  [place] Location: [facility]. [address] [place], [place]  [address]  [place]  Location: [facility] [address] [place], [place]  [address]

## 2024-02-08 NOTE — Progress Notes (Addendum)
 " Surgical Admission History & Physical   NAME: Barbara [name]  H&P Date: 02/08/2024   Procedure Date: 02/18/2023  Chief Complaint: right knee pain  HPI  Barbara [name] is a 74 y.o. female who has severe knee pain and has failed conservative treatment including NSAID's, injections, PT, HEP, and activity modification.  The patient states that the knee pain has progressed to the point that it is significantly interfering with her activities of daily living.  She has requested operative intervention for relief of her DJD symptoms.   She has OSA and uses CPAP regularly. She denies COPD, asthma, recent cardiac events, and any other changes to her medical history.  Social History: No tobacco, alcohol, or illicit drug use.  Medications & Allergies  Allergies: No Known Allergies  Home Medicines: Current Outpatient Medications on File Prior to Visit  Medication Sig Dispense Refill   acetaminophen  (TYLENOL ) 500 MG tablet Take 1,000 mg by mouth every 6 (six) hours as needed for mild pain (pain score 1-3), moderate pain (pain score 4-6) or headache (Knee).     amLODIPine  (NORVASC ) 5 MG tablet Take 1 tablet by mouth once daily 90 tablet 3   anastrozole  (ARIMIDEX ) 1 mg tablet Take 1 mg by mouth once daily     aspirin  81 MG EC tablet Take 81 mg by mouth once daily.     azelastine  (ASTELIN ) 137 mcg nasal spray Place 1 spray into both nostrils 2 (two) times daily 10 mL 3   CALCIUM CITRATE-VITAMIN D3 ORAL Take 2,000 mg by mouth 2 (two) times daily     cetirizine (ZYRTEC) 10 MG tablet Take 10 mg by mouth once daily     cholecalciferol (VITAMIN D3) 2,000 unit tablet Take 2,000 Units by mouth once daily.     cinnamon bark (CINNAMON ORAL) Take 2,000 mg by mouth.     fluocinolone  acetonide (DERMOTIC ) 0.01 % otic drop Place 5 drops into both ears 2 (two) times daily     krill oil 500 mg Cap Take by mouth once daily     lisinopriL  (ZESTRIL ) 2.5 MG tablet Take 1 tablet by mouth once daily 90 tablet 3    lovastatin (MEVACOR) 40 MG tablet Take 1 tablet by mouth once daily 90 tablet 3   meloxicam (MOBIC) 15 MG tablet Take 15 mg by mouth once daily     mometasone  (ELOCON ) 0.1 % lotion Apply topically once daily     pantoprazole  (PROTONIX ) 40 MG DR tablet Take 1 tablet by mouth once daily 90 tablet 0   No current facility-administered medications on file prior to visit.    Medical / Surgical History   Past Medical History:  Diagnosis Date   Breast cancer (CMS/HHS-HCC)    CKD (chronic kidney disease) stage 3, GFR 30-59 ml/min (CMS-HCC) 03/30/2023   Diverticulosis    Elevated fasting blood sugar    GERD (gastroesophageal reflux disease)    Hyperlipidemia    Hypertension    Osteoporosis    Sleep apnea      Past Surgical History:  Procedure Laterality Date   COLONOSCOPY  02/15/2003   Dr. CHARM Punch @ [facility] - Diverticulosis, FHCC(m)   EGD  08/11/2005   Dr. CHARM Punch @ [facility] - Schatzki's ring   COLONOSCOPY  12/09/2017   [facility] ([facility]) CBF 12/2022   MASTECTOMY, PARTIAL Right 10/30/2019   Dr Lucas Catchings    Colon @ [facility]  04/22/2023   Normal examined colon/FHx CC/Repeat 64yrs/SMR   EGD @ [facility]  04/22/2023  Gastritis/Fundic gland polyp/Repeat 69yrs/SMR   APPENDECTOMY     Benign breast biopsy     CHOLECYSTECTOMY       Physical Exam   Ht:165.1 cm (5' 5) Wt:93.3 kg (205 lb 9.6 oz) BMI: Body mass index is 34.21 kg/m.  Vitals:   02/08/24 1005  BP: 118/74  Weight: 93.3 kg (205 lb 9.6 oz)  Height: 165.1 cm (5' 5)  PainSc:   8  PainLoc: Knee     General/Constitutional: No apparent distress: well-nourished and well developed. Eyes: Pupils equal, round with synchronous movement. Lymphatic: No palpable axillary adenopathy. Respiratory: Non-labored breathing. Lungs clear to auscultation. No abnormal breath sounds.  Vascular: No edema, swelling or tenderness, except as noted in detailed exam. Cardiac: RRR. S1 and S2 noted. No murmurs or  gallops. Integumentary: No impressive skin lesions present, except as noted in detailed exam. Neuro/Psych: Normal mood and affect, oriented to person, place and time. Musculoskeletal:   Right knee exam     Soft tissue swelling: mild    Effusion:                   minimal    Erythema:                 none    Crepitance:               mild    Tenderness:             medial    Alignment:                relative varus    Mediolateral laxity:   medial pseudolaxity    Posterior sag:           negative    Patellar tracking:      Good tracking without evidence of subluxation or tilt    Atrophy:                    No significant atrophy.                                       Quadriceps tone was fair to good.    Range of motion:     0/3/105 degrees  Imaging  No new imaging  Previous imaging 01/04/2024: There is significant narrowing of the medial cartilage space with associated varus alignment. Osteophyte formation is noted. Subchondral sclerosis is noted. No evidence of fracture or dislocation.    Assesment and Plan  Knee DJD  1.  Treatment options were discussed today with the patient. Patient instructed to stop taking vitamins, supplements, and aspirin  7 days prior to surgery. 2.  The patient is instructed on the risk and benefits of a right total knee arthroplasty with Dr. Mardee on 02/18/2024. 3. She uses CPAP for obstructive sleep apnea and plans to bring it to hospital for the night after surgery.  4.  The patient was instructed on the risk and benefits of surgical intervention and wishes proceed at this time.  This document will serve as a surgical history and physical for the patient. 5.  The patient will follow-up per standard postop protocol.  They can call the clinic they have any questions, new symptoms develop or symptoms worsen.   The procedure was discussed with the patient, as were the potential risks (including bleeding, infection, nerve and/or blood vessel injury,  persistent or recurrent pain,  failure of the repair, progression of arthritis, need for further surgery, blood clots, strokes, heart attacks and/or arhythmias, pneumonia, etc.) and benefits.  The patient states his understanding and wishes to proceed.    JULIANA VOLPINI [name], PA   "

## 2024-02-10 ENCOUNTER — Other Ambulatory Visit: Payer: Self-pay

## 2024-02-10 ENCOUNTER — Encounter
Admission: RE | Admit: 2024-02-10 | Discharge: 2024-02-10 | Disposition: A | Discharge status: Home / Self Care | Source: Ambulatory Visit | Attending: Orthopedic Surgery | Admitting: Orthopedic Surgery

## 2024-02-10 VITALS — BP 118/59 | HR 76 | Resp 16 | Wt 206.1 lb

## 2024-02-10 DIAGNOSIS — N1831 Chronic kidney disease, stage 3a: Secondary | ICD-10-CM | POA: Insufficient documentation

## 2024-02-10 DIAGNOSIS — E1122 Type 2 diabetes mellitus with diabetic chronic kidney disease: Secondary | ICD-10-CM | POA: Insufficient documentation

## 2024-02-10 DIAGNOSIS — M1711 Unilateral primary osteoarthritis, right knee: Secondary | ICD-10-CM | POA: Insufficient documentation

## 2024-02-10 DIAGNOSIS — Z01818 Encounter for other preprocedural examination: Secondary | ICD-10-CM | POA: Diagnosis present

## 2024-02-10 DIAGNOSIS — Z79811 Long term (current) use of aromatase inhibitors: Secondary | ICD-10-CM | POA: Insufficient documentation

## 2024-02-10 DIAGNOSIS — Z01810 Encounter for preprocedural cardiovascular examination: Secondary | ICD-10-CM | POA: Diagnosis not present

## 2024-02-10 DIAGNOSIS — M25561 Pain in right knee: Secondary | ICD-10-CM

## 2024-02-10 DIAGNOSIS — Z01812 Encounter for preprocedural laboratory examination: Secondary | ICD-10-CM

## 2024-02-10 HISTORY — DX: Family history of other specified conditions: Z84.89

## 2024-02-10 HISTORY — DX: Unspecified osteoarthritis, unspecified site: M19.90

## 2024-02-10 HISTORY — DX: Malignant neoplasm of unspecified site of unspecified female breast: C50.919

## 2024-02-10 LAB — COMPREHENSIVE METABOLIC PANEL WITH GFR
ALT: 12 U/L (ref 0–44)
AST: 14 U/L — ABNORMAL LOW (ref 15–41)
Albumin: 4.4 g/dL (ref 3.5–5.0)
Alkaline Phosphatase: 75 U/L (ref 38–126)
Anion gap: 11 (ref 5–15)
BUN: 14 mg/dL (ref 8–23)
CO2: 25 mmol/L (ref 22–32)
Calcium: 10.2 mg/dL (ref 8.9–10.3)
Chloride: 105 mmol/L (ref 98–111)
Creatinine, Ser: 0.88 mg/dL (ref 0.44–1.00)
GFR, Estimated: >60 mL/min
Glucose, Bld: 111 mg/dL — ABNORMAL HIGH (ref 70–99)
Potassium: 3.9 mmol/L (ref 3.5–5.1)
Sodium: 141 mmol/L (ref 135–145)
Total Bilirubin: 0.9 mg/dL (ref 0.0–1.2)
Total Protein: 7.1 g/dL (ref 6.5–8.1)

## 2024-02-10 LAB — URINALYSIS, ROUTINE W REFLEX MICROSCOPIC
Bilirubin Urine: NEGATIVE
Glucose, UA: NEGATIVE mg/dL
Ketones, ur: NEGATIVE mg/dL
Leukocytes,Ua: NEGATIVE
Nitrite: NEGATIVE
Protein, ur: NEGATIVE mg/dL
Specific Gravity, Urine: 1.003 — ABNORMAL LOW (ref 1.005–1.030)
pH: 7 (ref 5.0–8.0)

## 2024-02-10 LAB — SEDIMENTATION RATE: Sed Rate: 15 mm/h (ref 0–30)

## 2024-02-10 LAB — HEMOGLOBIN A1C
Hgb A1c MFr Bld: 6 % — ABNORMAL HIGH (ref 4.8–5.6)
Mean Plasma Glucose: 125.5 mg/dL

## 2024-02-10 LAB — CBC
HCT: 40.8 % (ref 36.0–46.0)
Hemoglobin: 13.2 g/dL (ref 12.0–15.0)
MCH: 26.1 pg (ref 26.0–34.0)
MCHC: 32.4 g/dL (ref 30.0–36.0)
MCV: 80.8 fL (ref 80.0–100.0)
Platelets: 206 K/uL (ref 150–400)
RBC: 5.05 MIL/uL (ref 3.87–5.11)
RDW: 14.6 % (ref 11.5–15.5)
WBC: 5.1 K/uL (ref 4.0–10.5)
nRBC: 0 % (ref 0.0–0.2)

## 2024-02-10 LAB — C-REACTIVE PROTEIN: CRP: 0.6 mg/dL

## 2024-02-10 LAB — SURGICAL PCR SCREEN
MRSA, PCR: NEGATIVE
Staphylococcus aureus: NEGATIVE

## 2024-02-10 NOTE — Patient Instructions (Addendum)
 Your procedure is scheduled on: 02/18/24 - Friday Report to the Registration Desk on the 1st floor of the [facility]. To find out your arrival time, please call [phone] between 1PM - 3PM on: 02/17/24 - Thursday If your arrival time is 6:00 am, do not arrive before that time as the [facility] entrance doors do not open until 6:00 am.  REMEMBER: Instructions that are not followed completely may result in serious medical risk, up to and including death; or upon the discretion of your surgeon and anesthesiologist your surgery may need to be rescheduled.  Do not eat food after midnight the night before surgery.  No gum chewing or hard candies.  You may however, drink CLEAR liquids up to 2 hours before you are scheduled to arrive for your surgery. Do not drink anything within 2 hours of your scheduled arrival time.  Clear liquids include: - water  - apple juice without pulp - gatorade (not RED colors) - black coffee or tea (Do NOT add milk or creamers to the coffee or tea) Do NOT drink anything that is not on this list.  In addition, your doctor has ordered for you to drink the provided:  Ensure Pre-Surgery Clear Carbohydrate Drink  Drinking this carbohydrate drink up to two hours before surgery helps to reduce insulin resistance and improve patient outcomes. Please complete drinking 2 hours before scheduled arrival time.  One week prior to surgery: Stop Anti-inflammatories (NSAIDS) such as Advil, Aleve, Ibuprofen, Motrin, Naproxen, Naprosyn and Aspirin  based products such as Excedrin, Goody's Powder, BC Powder. You may take Tylenol  if needed for pain up until the day of surgery.  Stop ANY OVER THE COUNTER supplements until after surgery - CALCIUM-VITAMIN D, CINNAMON ,Krill Oil ,VITAMIN D3 .  aspirin  81 hold 7 days prior to surgery.  meloxicam (MOBIC) hold 7 days prior to surgery.  ON THE DAY OF SURGERY ONLY TAKE THESE MEDICATIONS WITH SIPS OF WATER:  pantoprazole  (PROTONIX )      No Alcohol for 24 hours before or after surgery.  No Smoking including e-cigarettes for 24 hours before surgery.  No chewable tobacco products for at least 6 hours before surgery.  No nicotine patches on the day of surgery.  Do not use any recreational drugs for at least a week (preferably 2 weeks) before your surgery.  Please be advised that the combination of cocaine and anesthesia may have negative outcomes, up to and including death. If you test positive for cocaine, your surgery will be cancelled.  On the morning of surgery brush your teeth with toothpaste and water, you may rinse your mouth with mouthwash if you wish. Do not swallow any toothpaste or mouthwash.  Use CHG Soap or wipes as directed on instruction sheet.  Do not wear jewelry, make-up, hairpins, clips or nail polish.  For welded (permanent) jewelry: bracelets, anklets, waist bands, etc.  Please have this removed prior to surgery.  If it is not removed, there is a chance that hospital personnel will need to cut it off on the day of surgery.  Do not wear lotions, powders, or perfumes.   Do not shave body hair from the neck down 48 hours before surgery.  Contact lenses, hearing aids and dentures may not be worn into surgery.  Do not bring valuables to the hospital. [facility] is not responsible for any missing/lost belongings or valuables.   Bring your C-PAP to the hospital in case you may have to spend the night.   Notify your  doctor if there is any change in your medical condition (cold, fever, infection).  Wear comfortable clothing (specific to your surgery type) to the hospital.  After surgery, you can help prevent lung complications by doing breathing exercises.  Take deep breaths and cough every 1-2 hours. Your doctor may order a device called an Incentive Spirometer to help you take deep breaths.  If you are being admitted to the hospital overnight, leave your suitcase in the car. After surgery it  may be brought to your room.  In case of increased patient census, it may be necessary for you, the patient, to continue your postoperative care in the Same Day Surgery department.  If you are being discharged the day of surgery, you will not be allowed to drive home. You will need a responsible individual to drive you home and stay with you for 24 hours after surgery.   If you are taking public transportation, you will need to have a responsible individual with you.  Please call the Pre-admissions Testing Dept. at [phone] if you have any questions about these instructions.  Surgery Visitation Policy:  Patients having surgery or a procedure may have two visitors.  Children under the age of 72 must have an adult with them who is not the patient.  Inpatient Visitation:    Visiting hours are 7 a.m. to 8 p.m. Up to four visitors are allowed at one time in a patient room. The visitors may rotate out with other people during the day.  One visitor age 58 or older may stay with the patient overnight and must be in the room by 8 p.m.   Merchandiser, Retail to address health-related social needs:  [email or web address]    Pre-operative 4 CHG Bath Instructions   You can play a key role in reducing the risk of infection after surgery. Your skin needs to be as free of germs as possible. You can reduce the number of germs on your skin by washing with CHG (chlorhexidine  gluconate) soap before surgery. CHG is an antiseptic soap that kills germs and continues to kill germs even after washing.   DO NOT use if you have an allergy to chlorhexidine /CHG or antibacterial soaps. If your skin becomes reddened or irritated, stop using the CHG and notify one of our RNs at [phone].   Please shower with the CHG soap starting 4 days before surgery using the following schedule: 01/12 - 01/16.    Please keep in mind the following:  DO NOT shave, including legs and underarms,  starting the day of your first shower.   You may shave your face at any point before/day of surgery.  Place clean sheets on your bed the day you start using CHG soap. Use a clean washcloth (not used since being washed) for each shower. DO NOT sleep with pets once you start using the CHG.   CHG Shower Instructions:  If you choose to wash your hair and private area, wash first with your normal shampoo/soap.  After you use shampoo/soap, rinse your hair and body thoroughly to remove shampoo/soap residue.  Turn the water OFF and apply about 3 tablespoons (45 ml) of CHG soap to a CLEAN washcloth.  Apply CHG soap ONLY FROM YOUR NECK DOWN TO YOUR TOES (washing for 3-5 minutes)  DO NOT use CHG soap on face, private areas, open wounds, or sores.  Pay special attention to the area where your surgery is being performed.  If you are having back surgery, having someone  wash your back for you may be helpful. Wait 2 minutes after CHG soap is applied, then you may rinse off the CHG soap.  Pat dry with a clean towel  Put on clean clothes/pajamas   If you choose to wear lotion, please use ONLY the CHG-compatible lotions on the back of this paper.     Additional instructions for the day of surgery: DO NOT APPLY any lotions, deodorants, cologne, or perfumes.   Put on clean/comfortable clothes.  Brush your teeth.  Ask your nurse before applying any prescription medications to the skin.      CHG Compatible Lotions   Aveeno Moisturizing lotion  Cetaphil Moisturizing Cream  Cetaphil Moisturizing Lotion  Clairol Herbal Essence Moisturizing Lotion, Dry Skin  Clairol Herbal Essence Moisturizing Lotion, Extra Dry Skin  Clairol Herbal Essence Moisturizing Lotion, Normal Skin  Curel Age Defying Therapeutic Moisturizing Lotion with Alpha Hydroxy  Curel Extreme Care Body Lotion  Curel Soothing Hands Moisturizing Hand Lotion  Curel Therapeutic Moisturizing Cream, Fragrance-Free  Curel Therapeutic Moisturizing  Lotion, Fragrance-Free  Curel Therapeutic Moisturizing Lotion, Original Formula  Eucerin Daily Replenishing Lotion  Eucerin Dry Skin Therapy Plus Alpha Hydroxy Crme  Eucerin Dry Skin Therapy Plus Alpha Hydroxy Lotion  Eucerin Original Crme  Eucerin Original Lotion  Eucerin Plus Crme Eucerin Plus Lotion  Eucerin TriLipid Replenishing Lotion  Keri Anti-Bacterial Hand Lotion  Keri Deep Conditioning Original Lotion Dry Skin Formula Softly Scented  Keri Deep Conditioning Original Lotion, Fragrance Free Sensitive Skin Formula  Keri Lotion Fast Absorbing Fragrance Free Sensitive Skin Formula  Keri Lotion Fast Absorbing Softly Scented Dry Skin Formula  Keri Original Lotion  Keri Skin Renewal Lotion Keri Silky Smooth Lotion  Keri Silky Smooth Sensitive Skin Lotion  Nivea Body Creamy Conditioning Oil  Nivea Body Extra Enriched Lotion  Nivea Body Original Lotion  Nivea Body Sheer Moisturizing Lotion Nivea Crme  Nivea Skin Firming Lotion  NutraDerm 30 Skin Lotion  NutraDerm Skin Lotion  NutraDerm Therapeutic Skin Cream  NutraDerm Therapeutic Skin Lotion  ProShield Protective Hand Cream  Provon moisturizing lotion  How to Use an Incentive Spirometer  An incentive spirometer is a tool that measures how well you are filling your lungs with each breath. Learning to take long, deep breaths using this tool can help you keep your lungs clear and active. This may help to reverse or lessen your chance of developing breathing (pulmonary) problems, especially infection. You may be asked to use a spirometer: After a surgery. If you have a lung problem or a history of smoking. After a long period of time when you have been unable to move or be active. If the spirometer includes an indicator to show the highest number that you have reached, your health care provider or respiratory therapist will help you set a goal. Keep a log of your progress as told by your health care provider. What are the  risks? Breathing too quickly may cause dizziness or cause you to pass out. Take your time so you do not get dizzy or light-headed. If you are in pain, you may need to take pain medicine before doing incentive spirometry. It is harder to take a deep breath if you are having pain. How to use your incentive spirometer  Sit up on the edge of your bed or on a chair. Hold the incentive spirometer so that it is in an upright position. Before you use the spirometer, breathe out normally. Place the mouthpiece in your mouth. Make sure your lips  are closed tightly around it. Breathe in slowly and as deeply as you can through your mouth, causing the piston or the ball to rise toward the top of the chamber. Hold your breath for 3-5 seconds, or for as long as possible. If the spirometer includes a coach indicator, use this to guide you in breathing. Slow down your breathing if the indicator goes above the marked areas. Remove the mouthpiece from your mouth and breathe out normally. The piston or ball will return to the bottom of the chamber. Rest for a few seconds, then repeat the steps 10 or more times. Take your time and take a few normal breaths between deep breaths so that you do not get dizzy or light-headed. Do this every 1-2 hours when you are awake. If the spirometer includes a goal marker to show the highest number you have reached (best effort), use this as a goal to work toward during each repetition. After each set of 10 deep breaths, cough a few times. This will help to make sure that your lungs are clear. If you have an incision on your chest or abdomen from surgery, place a pillow or a rolled-up towel firmly against the incision when you cough. This can help to reduce pain while taking deep breaths and coughing. General tips When you are able to get out of bed: Walk around often. Continue to take deep breaths and cough in order to clear your lungs. Keep using the incentive spirometer until  your health care provider says it is okay to stop using it. If you have been in the hospital, you may be told to keep using the spirometer at home. Contact a health care provider if: You are having difficulty using the spirometer. You have trouble using the spirometer as often as instructed. Your pain medicine is not giving enough relief for you to use the spirometer as told. You have a fever. Get help right away if: You develop shortness of breath. You develop a cough with bloody mucus from the lungs. You have fluid or blood coming from an incision site after you cough. Summary An incentive spirometer is a tool that can help you learn to take long, deep breaths to keep your lungs clear and active. You may be asked to use a spirometer after a surgery, if you have a lung problem or a history of smoking, or if you have been inactive for a long period of time. Use your incentive spirometer as instructed every 1-2 hours while you are awake. If you have an incision on your chest or abdomen, place a pillow or a rolled-up towel firmly against your incision when you cough. This will help to reduce pain. Get help right away if you have shortness of breath, you cough up bloody mucus, or blood comes from your incision when you cough. This information is not intended to replace advice given to you by your health care provider. Make sure you discuss any questions you have with your health care provider. Document Revised: 04/10/2019 Document Reviewed: 04/10/2019 Elsevier Patient Education  2023 Elsevier Inc.  POLAR CARE INFORMATION  Massadvertisement.it  How to use [facility] Therapy System?  YouTube   Shippingscam.co.uk  OPERATING INSTRUCTIONS  Start the product With dry hands, connect the transformer to the electrical connection located on the top of the cooler. Next, plug the transformer into an appropriate electrical outlet. The unit will automatically start  running at this point.  To stop the pump, disconnect  electrical power.  Unplug to stop the product when not in use. Unplugging the Polar Care unit turns it off. Always unplug immediately after use. Never leave it plugged in while unattended. Remove pad.    FIRST ADD WATER TO FILL LINE, THEN ICE---Replace ice when existing ice is almost melted  1 Discuss Treatment with your Licensed Health Care Practitioner and Use Only as Prescribed 2 Apply Insulation Barrier & Cold Therapy Pad 3 Check for Moisture 4 Inspect Skin Regularly  Tips and Trouble Shooting Usage Tips 1. Use cubed or chunked ice for optimal performance. 2. It is recommended to drain the Pad between uses. To drain the pad, hold the Pad upright with the hose pointed toward the ground. Depress the black plunger and allow water to drain out. 3. You may disconnect the Pad from the unit without removing the pad from the affected area by depressing the silver tabs on the hose coupling and gently pulling the hoses apart. The Pad and unit will seal itself and will not leak. Note: Some dripping during release is normal. 4. DO NOT RUN PUMP WITHOUT WATER! The pump in this unit is designed to run with water. Running the unit without water will cause permanent damage to the pump. 5. Unplug unit before removing lid.  TROUBLESHOOTING GUIDE Pump not running, Water not flowing to the pad, Pad is not getting cold 1. Make sure the transformer is plugged into the wall outlet. 2. Confirm that the ice and water are filled to the indicated levels. 3. Make sure there are no kinks in the pad. 4. Gently pull on the blue tube to make sure the tube/pad junction is straight. 5. Remove the pad from the treatment site and ll it while the pad is lying at; then reapply. 6. Confirm that the pad couplings are securely attached to the unit. Listen for the double clicks (Figure 1) to confirm the pad couplings are securely attached.  Leaks    Note: Some condensation  on the lines, controller, and pads is unavoidable, especially in warmer climates. 1. If using a [facility] Polar Care Cold Therapy unit with a detachable Cold Therapy Pad, and a leak exists (other than condensation on the lines) disconnect the pad couplings. Make sure the silver tabs on the couplings are depressed before reconnecting the pad to the pump hose; then confirm both sides of the coupling are properly clicked in. 2. If the coupling continues to leak or a leak is detected in the pad itself, stop using it and call [employer] Customer Care at [phone].  Cleaning After use, empty and dry the unit with a soft cloth. Warm water and mild detergent may be used occasionally to clean the pump and tubes.  WARNING: The Polar Care Cube can be cold enough to cause serious injury, including full skin necrosis. Follow these Operating Instructions, and carefully read the Product Insert (see pouch on side of unit) and the Cold Therapy Pad Fitting Instructions (provided with each Cold Therapy Pad) prior to use.       Preoperative Educational Videos for Total Hip, Knee and Shoulder Replacements  To better prepare for surgery, please view our videos that explain the physical activity and discharge planning required to have the best surgical recovery at [facility].  [email or web address]  Questions? Call [phone] or email [email or web address]

## 2024-02-17 ENCOUNTER — Other Ambulatory Visit: Payer: Self-pay | Admitting: Oncology

## 2024-02-18 ENCOUNTER — Observation Stay

## 2024-02-18 ENCOUNTER — Encounter: Payer: Self-pay | Admitting: Orthopedic Surgery

## 2024-02-18 ENCOUNTER — Encounter
Admission: RE | Disposition: A | Discharge status: Home / Self Care | Payer: Self-pay | Source: Ambulatory Visit | Attending: Orthopedic Surgery

## 2024-02-18 ENCOUNTER — Observation Stay
Admission: RE | Admit: 2024-02-18 | Discharge: 2024-02-19 | Disposition: A | Discharge status: Home / Self Care | Source: Ambulatory Visit | Attending: Orthopedic Surgery | Admitting: Orthopedic Surgery

## 2024-02-18 ENCOUNTER — Other Ambulatory Visit: Payer: Self-pay

## 2024-02-18 ENCOUNTER — Ambulatory Visit: Admitting: Certified Registered"

## 2024-02-18 DIAGNOSIS — Z79899 Other long term (current) drug therapy: Secondary | ICD-10-CM | POA: Diagnosis not present

## 2024-02-18 DIAGNOSIS — I129 Hypertensive chronic kidney disease with stage 1 through stage 4 chronic kidney disease, or unspecified chronic kidney disease: Secondary | ICD-10-CM | POA: Diagnosis not present

## 2024-02-18 DIAGNOSIS — N183 Chronic kidney disease, stage 3 unspecified: Secondary | ICD-10-CM | POA: Insufficient documentation

## 2024-02-18 DIAGNOSIS — Z79811 Long term (current) use of aromatase inhibitors: Secondary | ICD-10-CM

## 2024-02-18 DIAGNOSIS — Z96651 Presence of right artificial knee joint: Secondary | ICD-10-CM

## 2024-02-18 DIAGNOSIS — K219 Gastro-esophageal reflux disease without esophagitis: Secondary | ICD-10-CM | POA: Insufficient documentation

## 2024-02-18 DIAGNOSIS — Z853 Personal history of malignant neoplasm of breast: Secondary | ICD-10-CM | POA: Diagnosis not present

## 2024-02-18 DIAGNOSIS — Z85828 Personal history of other malignant neoplasm of skin: Secondary | ICD-10-CM | POA: Diagnosis not present

## 2024-02-18 DIAGNOSIS — M1711 Unilateral primary osteoarthritis, right knee: Principal | ICD-10-CM | POA: Insufficient documentation

## 2024-02-18 DIAGNOSIS — M810 Age-related osteoporosis without current pathological fracture: Secondary | ICD-10-CM | POA: Diagnosis not present

## 2024-02-18 DIAGNOSIS — N1831 Chronic kidney disease, stage 3a: Secondary | ICD-10-CM

## 2024-02-18 DIAGNOSIS — M25561 Pain in right knee: Secondary | ICD-10-CM | POA: Diagnosis present

## 2024-02-18 HISTORY — PX: KNEE ARTHROPLASTY: SHX992

## 2024-02-18 MED ORDER — HYDROMORPHONE HCL 1 MG/ML IJ SOLN
0.5000 mg | INTRAMUSCULAR | Status: DC | PRN
  Administered 2024-02-18: 1 mg via INTRAVENOUS
  Filled 2024-02-18: qty 1

## 2024-02-18 MED ORDER — CEFAZOLIN SODIUM-DEXTROSE 2-4 GM/100ML-% IV SOLN
2.0000 g | INTRAVENOUS | Status: AC
  Administered 2024-02-18: 2 g via INTRAVENOUS

## 2024-02-18 MED ORDER — BUPIVACAINE LIPOSOME 1.3 % IJ SUSP
INTRAMUSCULAR | Status: AC
  Filled 2024-02-18: qty 20

## 2024-02-18 MED ORDER — ONDANSETRON HCL 4 MG/2ML IJ SOLN
INTRAMUSCULAR | Status: AC
  Filled 2024-02-18: qty 2

## 2024-02-18 MED ORDER — ASPIRIN 81 MG PO CHEW
81.0000 mg | CHEWABLE_TABLET | Freq: Two times a day (BID) | ORAL | Status: DC
  Administered 2024-02-19: 81 mg via ORAL
  Filled 2024-02-18: qty 1

## 2024-02-18 MED ORDER — CEFAZOLIN SODIUM-DEXTROSE 2-4 GM/100ML-% IV SOLN
2.0000 g | Freq: Four times a day (QID) | INTRAVENOUS | Status: AC
  Administered 2024-02-18 (×2): 2 g via INTRAVENOUS
  Filled 2024-02-18 (×2): qty 100

## 2024-02-18 MED ORDER — FENTANYL CITRATE (PF) 100 MCG/2ML IJ SOLN
INTRAMUSCULAR | Status: DC | PRN
  Administered 2024-02-18 (×4): 25 ug via INTRAVENOUS

## 2024-02-18 MED ORDER — DIPHENHYDRAMINE HCL 12.5 MG/5ML PO ELIX: 12.5000 mg | ORAL_SOLUTION | ORAL | Status: DC | PRN

## 2024-02-18 MED ORDER — BUPIVACAINE HCL (PF) 0.5 % IJ SOLN
INTRAMUSCULAR | Status: AC
  Filled 2024-02-18: qty 10

## 2024-02-18 MED ORDER — FENTANYL CITRATE (PF) 100 MCG/2ML IJ SOLN
INTRAMUSCULAR | Status: AC
  Filled 2024-02-18: qty 2

## 2024-02-18 MED ORDER — CHLORHEXIDINE GLUCONATE 4 % EX SOLN
60.0000 mL | Freq: Once | CUTANEOUS | Status: AC
  Administered 2024-02-18: 4 via TOPICAL

## 2024-02-18 MED ORDER — DEXAMETHASONE SOD PHOSPHATE PF 10 MG/ML IJ SOLN
8.0000 mg | Freq: Once | INTRAMUSCULAR | Status: AC
  Administered 2024-02-18: 8 mg via INTRAVENOUS

## 2024-02-18 MED ORDER — PROPOFOL 1000 MG/100ML IV EMUL
INTRAVENOUS | Status: AC
  Filled 2024-02-18: qty 100

## 2024-02-18 MED ORDER — LACTATED RINGERS IV SOLN: INTRAVENOUS | Status: DC

## 2024-02-18 MED ORDER — DEXAMETHASONE SODIUM PHOSPHATE 4 MG/ML IJ SOLN
INTRAMUSCULAR | Status: DC | PRN
  Administered 2024-02-18: 4 mg via INTRAVENOUS

## 2024-02-18 MED ORDER — DEXAMETHASONE SOD PHOSPHATE PF 10 MG/ML IJ SOLN
INTRAMUSCULAR | Status: AC
  Filled 2024-02-18: qty 1

## 2024-02-18 MED ORDER — MAGNESIUM HYDROXIDE 400 MG/5ML PO SUSP
30.0000 mL | Freq: Every day | ORAL | Status: DC
  Administered 2024-02-18 – 2024-02-19 (×2): 30 mL via ORAL
  Filled 2024-02-18 (×2): qty 30

## 2024-02-18 MED ORDER — PRAVASTATIN SODIUM 20 MG PO TABS
40.0000 mg | ORAL_TABLET | Freq: Every day | ORAL | Status: DC
  Administered 2024-02-18: 40 mg via ORAL
  Filled 2024-02-18: qty 2

## 2024-02-18 MED ORDER — ANASTROZOLE 1 MG PO TABS
1.0000 mg | ORAL_TABLET | Freq: Every day | ORAL | Status: DC
  Administered 2024-02-18 – 2024-02-19 (×2): 1 mg via ORAL
  Filled 2024-02-18 (×2): qty 1

## 2024-02-18 MED ORDER — SODIUM CHLORIDE 0.9 % IV SOLN: INTRAVENOUS | Status: DC

## 2024-02-18 MED ORDER — BUPIVACAINE HCL (PF) 0.25 % IJ SOLN
INTRAMUSCULAR | Status: AC
  Filled 2024-02-18: qty 60

## 2024-02-18 MED ORDER — CELECOXIB 200 MG PO CAPS
400.0000 mg | ORAL_CAPSULE | Freq: Once | ORAL | Status: AC
  Administered 2024-02-18: 400 mg via ORAL

## 2024-02-18 MED ORDER — DROPERIDOL 2.5 MG/ML IJ SOLN: 0.6250 mg | Freq: Once | INTRAMUSCULAR | Status: DC | PRN

## 2024-02-18 MED ORDER — TRANEXAMIC ACID-NACL 1000-0.7 MG/100ML-% IV SOLN
INTRAVENOUS | Status: AC
  Filled 2024-02-18: qty 100

## 2024-02-18 MED ORDER — LISINOPRIL 5 MG PO TABS
2.5000 mg | ORAL_TABLET | Freq: Every day | ORAL | Status: DC
  Administered 2024-02-18 – 2024-02-19 (×2): 2.5 mg via ORAL
  Filled 2024-02-18 (×2): qty 1

## 2024-02-18 MED ORDER — CHLORHEXIDINE GLUCONATE 0.12 % MT SOLN
15.0000 mL | Freq: Once | OROMUCOSAL | Status: AC
  Administered 2024-02-18: 15 mL via OROMUCOSAL

## 2024-02-18 MED ORDER — OXYCODONE HCL 5 MG PO TABS: 5.0000 mg | ORAL_TABLET | Freq: Once | ORAL | Status: DC | PRN

## 2024-02-18 MED ORDER — ALUM & MAG HYDROXIDE-SIMETH 200-200-20 MG/5ML PO SUSP: 30.0000 mL | ORAL | Status: DC | PRN

## 2024-02-18 MED ORDER — ACETAMINOPHEN 10 MG/ML IV SOLN
1000.0000 mg | Freq: Four times a day (QID) | INTRAVENOUS | Status: AC
  Administered 2024-02-18 – 2024-02-19 (×3): 1000 mg via INTRAVENOUS
  Filled 2024-02-18 (×3): qty 100

## 2024-02-18 MED ORDER — AMLODIPINE BESYLATE 5 MG PO TABS
5.0000 mg | ORAL_TABLET | Freq: Every day | ORAL | Status: DC
  Administered 2024-02-18: 5 mg via ORAL
  Filled 2024-02-18: qty 1

## 2024-02-18 MED ORDER — ACETAMINOPHEN 10 MG/ML IV SOLN: 1000.0000 mg | Freq: Once | INTRAVENOUS | Status: DC | PRN

## 2024-02-18 MED ORDER — ACETAMINOPHEN 10 MG/ML IV SOLN
INTRAVENOUS | Status: AC
  Filled 2024-02-18: qty 100

## 2024-02-18 MED ORDER — ONDANSETRON HCL 4 MG PO TABS: 4.0000 mg | ORAL_TABLET | Freq: Four times a day (QID) | ORAL | Status: DC | PRN

## 2024-02-18 MED ORDER — LIDOCAINE HCL (CARDIAC) PF 100 MG/5ML IV SOSY
PREFILLED_SYRINGE | INTRAVENOUS | Status: DC | PRN
  Administered 2024-02-18: 600 mg via INTRAVENOUS

## 2024-02-18 MED ORDER — BUPIVACAINE HCL (PF) 0.5 % IJ SOLN
INTRAMUSCULAR | Status: DC | PRN
  Administered 2024-02-18: 3 mL via INTRATHECAL

## 2024-02-18 MED ORDER — PHENYLEPHRINE HCL-NACL 20-0.9 MG/250ML-% IV SOLN
INTRAVENOUS | Status: DC | PRN
  Administered 2024-02-18: 20 ug/min via INTRAVENOUS

## 2024-02-18 MED ORDER — ACETAMINOPHEN 10 MG/ML IV SOLN
INTRAVENOUS | Status: DC | PRN
  Administered 2024-02-18: 1000 mg via INTRAVENOUS

## 2024-02-18 MED ORDER — PROPOFOL 10 MG/ML IV BOLUS
INTRAVENOUS | Status: DC | PRN
  Administered 2024-02-18: 40 mg via INTRAVENOUS

## 2024-02-18 MED ORDER — MIDAZOLAM HCL 2 MG/2ML IJ SOLN
INTRAMUSCULAR | Status: AC
  Filled 2024-02-18: qty 2

## 2024-02-18 MED ORDER — BISACODYL 10 MG RE SUPP: 10.0000 mg | Freq: Every day | RECTAL | Status: DC | PRN

## 2024-02-18 MED ORDER — CELECOXIB 200 MG PO CAPS
ORAL_CAPSULE | ORAL | Status: AC
  Filled 2024-02-18: qty 2

## 2024-02-18 MED ORDER — SURGIPHOR WOUND IRRIGATION SYSTEM - OPTIME
TOPICAL | Status: DC | PRN
  Administered 2024-02-18: 450 mL via TOPICAL

## 2024-02-18 MED ORDER — CELECOXIB 200 MG PO CAPS
200.0000 mg | ORAL_CAPSULE | Freq: Two times a day (BID) | ORAL | Status: DC
  Administered 2024-02-18 – 2024-02-19 (×3): 200 mg via ORAL
  Filled 2024-02-18 (×3): qty 1

## 2024-02-18 MED ORDER — FENTANYL CITRATE (PF) 100 MCG/2ML IJ SOLN: 25.0000 ug | INTRAMUSCULAR | Status: DC | PRN

## 2024-02-18 MED ORDER — CHLORHEXIDINE GLUCONATE 0.12 % MT SOLN
OROMUCOSAL | Status: AC
  Filled 2024-02-18: qty 15

## 2024-02-18 MED ORDER — SODIUM CHLORIDE 0.9 % IR SOLN
Status: DC | PRN
  Administered 2024-02-18: 3000 mL

## 2024-02-18 MED ORDER — OXYCODONE HCL 5 MG PO TABS
5.0000 mg | ORAL_TABLET | ORAL | Status: DC | PRN
  Administered 2024-02-19: 5 mg via ORAL
  Filled 2024-02-18: qty 1

## 2024-02-18 MED ORDER — MIDAZOLAM HCL 5 MG/5ML IJ SOLN
INTRAMUSCULAR | Status: DC | PRN
  Administered 2024-02-18: 2 mg via INTRAVENOUS

## 2024-02-18 MED ORDER — CEFAZOLIN SODIUM-DEXTROSE 2-4 GM/100ML-% IV SOLN
INTRAVENOUS | Status: AC
  Filled 2024-02-18: qty 100

## 2024-02-18 MED ORDER — ONDANSETRON HCL 4 MG/2ML IJ SOLN
INTRAMUSCULAR | Status: DC | PRN
  Administered 2024-02-18: 4 mg via INTRAVENOUS

## 2024-02-18 MED ORDER — FERROUS SULFATE 325 (65 FE) MG PO TABS
325.0000 mg | ORAL_TABLET | Freq: Two times a day (BID) | ORAL | Status: DC
  Administered 2024-02-18 – 2024-02-19 (×2): 325 mg via ORAL
  Filled 2024-02-18 (×2): qty 1

## 2024-02-18 MED ORDER — ACETAMINOPHEN 325 MG PO TABS: 325.0000 mg | ORAL_TABLET | Freq: Four times a day (QID) | ORAL | Status: DC | PRN

## 2024-02-18 MED ORDER — MENTHOL 3 MG MT LOZG: 1.0000 | LOZENGE | OROMUCOSAL | Status: DC | PRN

## 2024-02-18 MED ORDER — SENNOSIDES-DOCUSATE SODIUM 8.6-50 MG PO TABS
1.0000 | ORAL_TABLET | Freq: Two times a day (BID) | ORAL | Status: DC
  Administered 2024-02-18 – 2024-02-19 (×3): 1 via ORAL
  Filled 2024-02-18 (×3): qty 1

## 2024-02-18 MED ORDER — ORAL CARE MOUTH RINSE: 15.0000 mL | Freq: Once | OROMUCOSAL | Status: AC

## 2024-02-18 MED ORDER — TRANEXAMIC ACID-NACL 1000-0.7 MG/100ML-% IV SOLN
1000.0000 mg | INTRAVENOUS | Status: AC
  Administered 2024-02-18: 1000 mg via INTRAVENOUS

## 2024-02-18 MED ORDER — GABAPENTIN 300 MG PO CAPS
ORAL_CAPSULE | ORAL | Status: AC
  Filled 2024-02-18: qty 1

## 2024-02-18 MED ORDER — FLEET ENEMA RE ENEM: 1.0000 | ENEMA | Freq: Once | RECTAL | Status: DC | PRN

## 2024-02-18 MED ORDER — ONDANSETRON HCL 4 MG/2ML IJ SOLN
4.0000 mg | Freq: Four times a day (QID) | INTRAMUSCULAR | Status: DC | PRN
  Administered 2024-02-18: 4 mg via INTRAVENOUS
  Filled 2024-02-18: qty 2

## 2024-02-18 MED ORDER — PANTOPRAZOLE SODIUM 40 MG PO TBEC
40.0000 mg | DELAYED_RELEASE_TABLET | Freq: Two times a day (BID) | ORAL | Status: DC
  Administered 2024-02-18 – 2024-02-19 (×3): 40 mg via ORAL
  Filled 2024-02-18 (×2): qty 1

## 2024-02-18 MED ORDER — PROPOFOL 10 MG/ML IV BOLUS
INTRAVENOUS | Status: AC
  Filled 2024-02-18: qty 20

## 2024-02-18 MED ORDER — PHENYLEPHRINE HCL-NACL 20-0.9 MG/250ML-% IV SOLN
INTRAVENOUS | Status: AC
  Filled 2024-02-18: qty 250

## 2024-02-18 MED ORDER — PHENOL 1.4 % MT LIQD: 1.0000 | OROMUCOSAL | Status: DC | PRN

## 2024-02-18 MED ORDER — TRAMADOL HCL 50 MG PO TABS: 50.0000 mg | ORAL_TABLET | ORAL | Status: DC | PRN

## 2024-02-18 MED ORDER — METOCLOPRAMIDE HCL 5 MG PO TABS
10.0000 mg | ORAL_TABLET | Freq: Three times a day (TID) | ORAL | Status: DC
  Administered 2024-02-18 – 2024-02-19 (×3): 10 mg via ORAL
  Filled 2024-02-18 (×3): qty 2

## 2024-02-18 MED ORDER — PROPOFOL 500 MG/50ML IV EMUL
INTRAVENOUS | Status: DC | PRN
  Administered 2024-02-18: 80 ug/kg/min via INTRAVENOUS

## 2024-02-18 MED ORDER — BUPIVACAINE HCL (PF) 0.25 % IJ SOLN
INTRAMUSCULAR | Status: DC | PRN
  Administered 2024-02-18: 60 mL

## 2024-02-18 MED ORDER — OXYCODONE HCL 5 MG/5ML PO SOLN: 5.0000 mg | Freq: Once | ORAL | Status: DC | PRN

## 2024-02-18 MED ORDER — OXYCODONE HCL 5 MG PO TABS
10.0000 mg | ORAL_TABLET | ORAL | Status: DC | PRN
  Administered 2024-02-18 (×2): 10 mg via ORAL
  Filled 2024-02-18 (×2): qty 2

## 2024-02-18 MED ORDER — AZELASTINE HCL 0.1 % NA SOLN: 1.0000 | Freq: Every day | NASAL | Status: DC | PRN

## 2024-02-18 MED ORDER — GABAPENTIN 300 MG PO CAPS
300.0000 mg | ORAL_CAPSULE | Freq: Once | ORAL | Status: AC
  Administered 2024-02-18: 300 mg via ORAL

## 2024-02-18 MED ORDER — SODIUM CHLORIDE 0.9 % IV SOLN
INTRAVENOUS | Status: DC | PRN
  Administered 2024-02-18: 60 mL

## 2024-02-18 MED ORDER — TRANEXAMIC ACID-NACL 1000-0.7 MG/100ML-% IV SOLN
1000.0000 mg | Freq: Once | INTRAVENOUS | Status: AC
  Administered 2024-02-18: 1000 mg via INTRAVENOUS

## 2024-02-18 MED ORDER — SODIUM CHLORIDE (PF) 0.9 % IJ SOLN
INTRAMUSCULAR | Status: AC
  Filled 2024-02-18: qty 40

## 2024-02-18 NOTE — Progress Notes (Signed)
 Patient is not able to walk the distance required to go the bathroom, or he/she is unable to safely negotiate stairs required to access the bathroom.  A 3in1 BSC will alleviate this problem   Amenda Duclos P. Angie Fava M.D.

## 2024-02-18 NOTE — Plan of Care (Signed)
   Problem: Education: Goal: Knowledge of General Education information will improve Description Including pain rating scale, medication(s)/side effects and non-pharmacologic comfort measures Outcome: Progressing   Problem: Health Behavior/Discharge Planning: Goal: Ability to manage health-related needs will improve Outcome: Progressing

## 2024-02-18 NOTE — Evaluation (Signed)
 Physical Therapy Evaluation Patient Details Name: Barbara [name] MRN: [id number] DOB: 06/12/1950 Today's Date: 02/18/2024  History of Present Illness  Pt is a 74 yo female s/p R TKA. PMH of sleep apnea, HTN, breast cancer, GERD.  Clinical Impression  Patient A&Ox4, reported 5/10 R knee pain. At baseline the pt is independent. She requested to use the bathroom, session focused on functional mobility. Supervision for bed mobility, CGA to stand from EOB and step pivot to [facility] with RW. Pericare and donning of briefs with minA. She was able to ambulate ~58ft in room with RW and CGA. Pt up in chair with needs in reach. Knee ROM 0-60degrees.  Overall the patient demonstrated deficits (see PT Problem List) that impede the patient's functional abilities, safety, and mobility and would benefit from skilled PT intervention.          If plan is discharge home, recommend the following: A little help with bathing/dressing/bathroom;Assistance with cooking/housework;Assist for transportation;Help with stairs or ramp for entrance   Can travel by private vehicle        Equipment Recommendations Rolling walker (2 wheels);BSC/3in1  Recommendations for Other Services       Functional Status Assessment Patient has had a recent decline in their functional status and demonstrates the ability to make significant improvements in function in a reasonable and predictable amount of time.     Precautions / Restrictions Precautions Precautions: Fall;Knee Precaution Booklet Issued: Yes (comment) Recall of Precautions/Restrictions: Intact Restrictions Weight Bearing Restrictions Per Provider Order: Yes RLE Weight Bearing Per Provider Order: Weight bearing as tolerated      Mobility  Bed Mobility Overal bed mobility: Needs Assistance Bed Mobility: Supine to Sit     Supine to sit: Supervision, Used rails, HOB elevated          Transfers Overall transfer level: Needs assistance Equipment used: Rolling  walker (2 wheels) Transfers: Sit to/from Stand, Bed to chair/wheelchair/BSC Sit to Stand: Contact guard assist   Step pivot transfers: Contact guard assist       General transfer comment: verbal cues for technique    Ambulation/Gait Ambulation/Gait assistance: Contact guard assist Gait Distance (Feet): 15 Feet Assistive device: Rolling walker (2 wheels)   Gait velocity: decreased     General Gait Details: antalgic, flexed trunk, step to pattern  Stairs            Wheelchair Mobility     Tilt Bed    Modified Rankin (Stroke Patients Only)       Balance Overall balance assessment: Needs assistance Sitting-balance support: Feet supported Sitting balance-Leahy Scale: Good     Standing balance support: Single extremity supported Standing balance-Leahy Scale: Good                               Pertinent Vitals/Pain Pain Assessment Pain Assessment: 0-10 Pain Score: 5  Pain Location: R knee Pain Descriptors / Indicators: Aching, Sore Pain Intervention(s): Monitored during session, Limited activity within patient's tolerance, Repositioned, Patient requesting pain meds-RN notified    Home Living Family/patient expects to be discharged to:: Private residence Living Arrangements: Spouse/significant other Available Help at Discharge: Family Type of Home: House Home Access: Stairs to enter Entrance Stairs-Rails: None Entrance Stairs-Number of Steps: 1   Home Layout: One level Home Equipment: Shower seat      Prior Function Prior Level of Function : Independent/Modified Independent  Extremity/Trunk Assessment   Upper Extremity Assessment Upper Extremity Assessment: Overall WFL for tasks assessed    Lower Extremity Assessment Lower Extremity Assessment: Generalized weakness (able to SLR on LLE and RLE)       Communication        Cognition Arousal: Alert Behavior During Therapy: WFL for tasks  assessed/performed   PT - Cognitive impairments: No apparent impairments                                 Cueing       General Comments      Exercises Other Exercises Other Exercises: session focused on pts need to use the commode, but able to ankle pump, SLR bilaterally. knee ROM 0-60degrees   Assessment/Plan    PT Assessment Patient needs continued PT services  PT Problem List Decreased strength;Pain;Decreased range of motion;Decreased activity tolerance;Decreased knowledge of use of DME;Decreased balance;Decreased knowledge of precautions;Decreased mobility       PT Treatment Interventions DME instruction;Balance training;Gait training;Neuromuscular re-education;Stair training;Functional mobility training;Patient/family education;Therapeutic activities;Therapeutic exercise    PT Goals (Current goals can be found in the Care Plan section)  Acute Rehab PT Goals Patient Stated Goal: to go home PT Goal Formulation: With patient Time For Goal Achievement: 03/03/24 Potential to Achieve Goals: Good    Frequency BID     Co-evaluation               AM-PAC PT 6 Clicks Mobility  Outcome Measure Help needed turning from your back to your side while in a flat bed without using bedrails?: None Help needed moving from lying on your back to sitting on the side of a flat bed without using bedrails?: None Help needed moving to and from a bed to a chair (including a wheelchair)?: None Help needed standing up from a chair using your arms (e.g., wheelchair or bedside chair)?: A Little Help needed to walk in hospital room?: A Little Help needed climbing 3-5 steps with a railing? : A Little 6 Click Score: 21    End of Session   Activity Tolerance: Patient tolerated treatment well Patient left: in chair;with call bell/phone within reach;with family/visitor present Nurse Communication: Mobility status PT Visit Diagnosis: Other abnormalities of gait and mobility  (R26.89);Difficulty in walking, not elsewhere classified (R26.2);Muscle weakness (generalized) (M62.81);Pain Pain - Right/Left: Right Pain - part of body: Knee    Time: 8558-8491 PT Time Calculation (min) (ACUTE ONLY): 27 min   Charges:   PT Evaluation $PT Eval Low Complexity: 1 Low PT Treatments $Therapeutic Activity: 23-37 mins PT General Charges $$ ACUTE PT VISIT: 1 Visit        Doyal Shams PT, DPT 4:19 PM,02/18/24

## 2024-02-18 NOTE — H&P (Signed)
 ORTHOPAEDIC HISTORY & PHYSICAL Barbara [name] [name], [place] - 02/08/2024 10:00 AM EST Formatting of this note is different from the original.  Surgical Admission History & Physical  NAME: Barbara [name] H&P Date: 02/08/2024 Procedure Date: 02/18/2023  Chief Complaint: right knee pain  HPI Barbara [name] is a 74 y.o. female who has severe knee pain and has failed conservative treatment including NSAID's, injections, PT, HEP, and activity modification. The patient states that the knee pain has progressed to the point that it is significantly interfering with her activities of daily living. She has requested operative intervention for relief of her DJD symptoms.  She has OSA and uses CPAP regularly. She denies COPD, asthma, recent cardiac events, and any other changes to her medical history.  Social History: No tobacco, alcohol, or illicit drug use. Medications & Allergies Allergies: No Known Allergies  Home Medicines: Current Outpatient Medications on File Prior to Visit Medication Sig Dispense Refill acetaminophen  (TYLENOL ) 500 MG tablet Take 1,000 mg by mouth every 6 (six) hours as needed for mild pain (pain score 1-3), moderate pain (pain score 4-6) or headache (Knee). amLODIPine  (NORVASC ) 5 MG tablet Take 1 tablet by mouth once daily 90 tablet 3 anastrozole  (ARIMIDEX ) 1 mg tablet Take 1 mg by mouth once daily aspirin  81 MG EC tablet Take 81 mg by mouth once daily. azelastine  (ASTELIN ) 137 mcg nasal spray Place 1 spray into both nostrils 2 (two) times daily 10 mL 3 CALCIUM CITRATE-VITAMIN D3 ORAL Take 2,000 mg by mouth 2 (two) times daily cetirizine (ZYRTEC) 10 MG tablet Take 10 mg by mouth once daily cholecalciferol (VITAMIN D3) 2,000 unit tablet Take 2,000 Units by mouth once daily. cinnamon bark (CINNAMON ORAL) Take 2,000 mg by mouth. fluocinolone  acetonide (DERMOTIC ) 0.01 % otic drop Place 5 drops into both ears 2 (two) times daily krill oil 500 mg Cap Take by mouth  once daily lisinopriL  (ZESTRIL ) 2.5 MG tablet Take 1 tablet by mouth once daily 90 tablet 3 lovastatin (MEVACOR) 40 MG tablet Take 1 tablet by mouth once daily 90 tablet 3 meloxicam (MOBIC) 15 MG tablet Take 15 mg by mouth once daily mometasone  (ELOCON ) 0.1 % lotion Apply topically once daily pantoprazole  (PROTONIX ) 40 MG DR tablet Take 1 tablet by mouth once daily 90 tablet 0  No current facility-administered medications on file prior to visit.  Medical / Surgical History  Past Medical History: Diagnosis Date Breast cancer (CMS/HHS-HCC) CKD (chronic kidney disease) stage 3, GFR 30-59 ml/min (CMS-HCC) 03/30/2023 Diverticulosis Elevated fasting blood sugar GERD (gastroesophageal reflux disease) Hyperlipidemia Hypertension Osteoporosis Sleep apnea   Past Surgical History: Procedure Laterality Date COLONOSCOPY 02/15/2003 Dr. CHARM Punch @ [facility] - Diverticulosis, [facility](m) EGD 08/11/2005 Dr. CHARM Punch @ [facility] - Schatzki's ring COLONOSCOPY 12/09/2017 [facility] (Mother) CBF 12/2022 MASTECTOMY, PARTIAL Right 10/30/2019 Dr Lucas Catchings Colon @ [facility] 04/22/2023 Normal examined colon/FHx CC/Repeat 2yrs/SMR EGD @ [facility] 04/22/2023 Gastritis/Fundic gland polyp/Repeat 40yrs/SMR APPENDECTOMY Benign breast biopsy CHOLECYSTECTOMY   Physical Exam  Ht:165.1 cm (5' 5) Wt:93.3 kg (205 lb 9.6 oz) BMI: Body mass index is 34.21 kg/m.  Vitals: 02/08/24 1005 BP: 118/74 Weight: 93.3 kg (205 lb 9.6 oz) Height: 165.1 cm (5' 5) PainSc: 8 PainLoc: Knee   General/Constitutional: No apparent distress: well-nourished and well developed. Eyes: Pupils equal, round with synchronous movement. Lymphatic: No palpable axillary adenopathy. Respiratory: Non-labored breathing. Lungs clear to auscultation. No abnormal breath sounds. Vascular: No edema, swelling or tenderness, except as noted in detailed exam. Cardiac: RRR. S1 and  S2 noted. No murmurs or gallops. Integumentary: No impressive skin lesions  present, except as noted in detailed exam. Neuro/Psych: Normal mood and affect, oriented to person, place and time. Musculoskeletal:  Right knee exam  Soft tissue swelling: mild Effusion: minimal Erythema: none Crepitance: mild Tenderness: medial Alignment: relative varus Mediolateral laxity: medial pseudolaxity Posterior sag: negative Patellar tracking: Good tracking without evidence of subluxation or tilt Atrophy: No significant atrophy. Quadriceps tone was fair to good. Range of motion: 0/3/105 degrees  Imaging No new imaging  Previous imaging 01/04/2024: There is significant narrowing of the medial cartilage space with associated varus alignment. Osteophyte formation is noted. Subchondral sclerosis is noted. No evidence of fracture or dislocation.  Assesment and Plan Knee DJD  1. Treatment options were discussed today with the patient. Patient instructed to stop taking vitamins, supplements, and aspirin  7 days prior to surgery. 2. The patient is instructed on the risk and benefits of a right total knee arthroplasty with Dr. Mardee on 02/18/2024. 3. She uses CPAP for obstructive sleep apnea and plans to bring it to hospital for the night after surgery. 4. The patient was instructed on the risk and benefits of surgical intervention and wishes proceed at this time. This document will serve as a surgical history and physical for the patient. 5. The patient will follow-up per standard postop protocol. They can call the clinic they have any questions, new symptoms develop or symptoms worsen.  The procedure was discussed with the patient, as were the potential risks (including bleeding, infection, nerve and/or blood vessel injury, persistent or recurrent pain, failure of the repair, progression of arthritis, need for further surgery, blood clots, strokes, heart attacks and/or arhythmias, pneumonia, etc.) and benefits. The patient states his understanding and wishes to  proceed.  [name] VOLPINI Barbara DE JAYSON DUNK, PA   Electronically signed by Barbara [name] Volpini, [place] at 02/08/2024 10:53 AM EST Electronically signed by Barbara [name] Volpini, PA at 02/09/2024 7:51 AM EST

## 2024-02-18 NOTE — Interval H&P Note (Signed)
 History and Physical Interval Note:  02/18/2024 6:20 AM  Barbara [name]  has presented today for surgery, with the diagnosis of Primary osteoarthritis of right knee.  The various methods of treatment have been discussed with the patient and family. After consideration of risks, benefits and other options for treatment, the patient has consented to  Procedures: ARTHROPLASTY, KNEE, TOTAL, USING IMAGELESS COMPUTER-ASSISTED NAVIGATION (Right) as a surgical intervention.  The patient's history has been reviewed, patient examined, no change in status, stable for surgery.  I have reviewed the patient's chart and labs.  Questions were answered to the patient's satisfaction.     Nura Cahoon P Kylin Dubs

## 2024-02-18 NOTE — Op Note (Signed)
 OPERATIVE NOTE  DATE OF SURGERY:  02/18/2024  PATIENT NAME:  Barbara [name]   DOB: [birth year]  MRN: [id number]  PRE-OPERATIVE DIAGNOSIS: Degenerative arthrosis of the right knee, primary  POST-OPERATIVE DIAGNOSIS:  Same  PROCEDURE:  Right total knee arthroplasty using computer-assisted navigation  SURGEON:  Lynwood SHAUNNA Mardee Mickey. M.D.  ANESTHESIA: spinal  ESTIMATED BLOOD LOSS: 50 mL  FLUIDS REPLACED: 800 mL of crystalloid  TOURNIQUET TIME: 78 minutes  DRAINS: 2 medium Hemovac drains  SOFT TISSUE RELEASES: Anterior cruciate ligament, posterior cruciate ligament, deep medial collateral ligament, patellofemoral ligament  IMPLANTS UTILIZED: DePuy Attune size 6N posterior stabilized femoral component (cemented), size 4 rotating platform tibial component (cemented), 35 mm medialized dome patella (cemented), and an 8 mm stabilized rotating platform polyethylene insert.  INDICATIONS FOR SURGERY: Barbara [name] is a 74 y.o. year old female with a long history of progressive knee pain. X-rays demonstrated severe degenerative changes in tricompartmental fashion. The patient had not seen any significant improvement despite conservative nonsurgical intervention. After discussion of the risks and benefits of surgical intervention, the patient expressed understanding of the risks benefits and agree with plans for total knee arthroplasty.   The risks, benefits, and alternatives were discussed at length including but not limited to the risks of infection, bleeding, nerve injury, stiffness, blood clots, the need for revision surgery, cardiopulmonary complications, among others, and they were willing to proceed.  PROCEDURE IN DETAIL: The patient was brought into the operating room and, after adequate spinal anesthesia was achieved, a tourniquet was placed on the patient's upper thigh. The patient's knee and leg were cleaned and prepped with alcohol and DuraPrep and draped in the usual sterile fashion. A  timeout was performed as per usual protocol. The lower extremity was exsanguinated using an Esmarch, and the tourniquet was inflated to 300 mmHg. An anterior longitudinal incision was made followed by a standard mid vastus approach. The deep fibers of the medial collateral ligament were elevated in a subperiosteal fashion off of the medial flare of the tibia so as to maintain a continuous soft tissue sleeve. The patella was subluxed laterally and the patellofemoral ligament was incised. Inspection of the knee demonstrated severe degenerative changes with full-thickness loss of articular cartilage. Osteophytes were debrided using a rongeur. Anterior and posterior cruciate ligaments were excised. Two 4.0 mm Schanz pins were inserted in the femur and into the tibia for attachment of the array of trackers used for computer-assisted navigation. Hip center was identified using a circumduction technique. Distal landmarks were mapped using the computer. The distal femur and proximal tibia were mapped using the computer. The distal femoral cutting guide was positioned using computer-assisted navigation so as to achieve a 5 distal valgus cut. The femur was sized and it was felt that a size 6N femoral component was appropriate. A size 6 femoral cutting guide was positioned and the anterior cut was performed and verified using the computer. This was followed by completion of the posterior and chamfer cuts. Femoral cutting guide for the central box was then positioned in the center box cut was performed.  Attention was then directed to the proximal tibia. Medial and lateral menisci were excised. The extramedullary tibial cutting guide was positioned using computer-assisted navigation so as to achieve a 0 varus-valgus alignment and 3 posterior slope. The cut was performed and verified using the computer. The proximal tibia was sized and it was felt that a size 4 tibial tray was appropriate. Tibial and femoral trials were  inserted followed  by insertion of an 8 mm polyethylene insert. This allowed for excellent mediolateral soft tissue balancing both in flexion and in full extension. Finally, the patella was cut and prepared so as to accommodate a 35 mm medialized dome patella. A patella trial was placed and the knee was placed through a range of motion with excellent patellar tracking appreciated. The femoral trial was removed after debridement of posterior osteophytes. The central post-hole for the tibial component was reamed followed by insertion of a keel punch. Tibial trials were then removed. Cut surfaces of bone were irrigated with copious amounts of normal saline using pulsatile lavage and then suctioned dry. Polymethylmethacrylate cement with gentamicin was prepared in the usual fashion using a vacuum mixer. Cement was applied to the cut surface of the proximal tibia as well as along the undersurface of a size 4 rotating platform tibial component. Tibial component was positioned and impacted into place. Excess cement was removed using Personal assistant. Cement was then applied to the cut surfaces of the femur as well as along the posterior flanges of the size 6N femoral component. The femoral component was positioned and impacted into place. Excess cement was removed using Personal assistant. An 8 mm polyethylene trial was inserted and the knee was brought into full extension with steady axial compression applied. Finally, cement was applied to the backside of a 35 mm medialized dome patella and the patellar component was positioned and patellar clamp applied. Excess cement was removed using Personal assistant. After adequate curing of the cement, the tourniquet was deflated after a total tourniquet time of 78 minutes. Hemostasis was achieved using electrocautery. The knee was irrigated with copious amounts of normal saline using pulsatile lavage followed by 450 ml of Surgiphor and then suctioned dry. 20 mL of 1.3% Exparel  and 60 mL  of 0.25% Marcaine  in 40 mL of normal saline was injected along the posterior capsule, medial and lateral gutters, and along the arthrotomy site. An 8 mm stabilized rotating platform polyethylene insert was inserted and the knee was placed through a range of motion with excellent mediolateral soft tissue balancing appreciated and excellent patellar tracking noted. 2 medium drains were placed in the wound bed and brought out through separate stab incisions. The medial parapatellar portion of the incision was reapproximated using interrupted sutures of #1 Vicryl. Subcutaneous tissue was approximated in layers using first #0 Vicryl followed #2-0 Vicryl. The skin was approximated with skin staples. A sterile dressing was applied.  The patient tolerated the procedure well and was transported to the recovery room in stable condition.    Anapaula Severt P. Arush Gatliff, Jr., M.D.

## 2024-02-18 NOTE — Anesthesia Preprocedure Evaluation (Signed)
 "                                  Anesthesia Evaluation  Patient identified by MRN, date of birth, ID band Patient awake    Reviewed: Allergy & Precautions, H&P , NPO status , Patient's Chart, lab work & pertinent test results, reviewed documented beta blocker date and time   Airway Mallampati: II   Neck ROM: full    Dental  (+) Poor Dentition   Pulmonary sleep apnea and Continuous Positive Airway Pressure Ventilation    Pulmonary exam normal        Cardiovascular Exercise Tolerance: Poor hypertension, On Medications negative cardio ROS Normal cardiovascular exam Rhythm:regular Rate:Normal     Neuro/Psych  Headaches  Neuromuscular disease  negative psych ROS   GI/Hepatic Neg liver ROS, hiatal hernia,GERD  Medicated,,  Endo/Other  negative endocrine ROS    Renal/GU Renal disease  negative genitourinary   Musculoskeletal   Abdominal   Peds  Hematology negative hematology ROS (+)   Anesthesia Other Findings Past Medical History: No date: Actinic keratosis No date: Arthritis No date: Breast cancer (HCC) No date: Cancer (HCC) No date: Family history of adverse reaction to anesthesia     Comment:  daughter was slow to wake up No date: Family history of breast cancer No date: Family history of lung cancer No date: Family history of pancreatic cancer No date: Family history of uterine cancer No date: GERD (gastroesophageal reflux disease) No date: Headache     Comment:  migraines No date: Hypertension 08/08/2020: Melanoma (HCC)     Comment:  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, R lat mid back, exc               09/18/20 02/15/2020: Osteopenia No date: Personal history of radiation therapy No date: Pre-diabetes No date: Sleep apnea     Comment:  uses cpap Past Surgical History: No date: APPENDECTOMY 10/20/2019: BREAST BIOPSY; Right     Comment:  Affirm bx-X clip positive 11/06/2020: BREAST BIOPSY; Left     Comment:  stereo bx, x clip,  benign 11/06/2020: BREAST BIOPSY; Left     Comment:  Stereo bx-Distortion #2-Coil clip-benign 11/12/2021: BREAST BIOPSY; Left     Comment:  stereo bx/ ribbon clip/ benign No date: BREAST CYST EXCISION; Right No date: BREAST CYST EXCISION; Right No date: BREAST CYST EXCISION; Left No date: BREAST LUMPECTOMY; Right No date: BREAST SURGERY No date: CHOLECYSTECTOMY 04/22/2023: COLONOSCOPY WITH PROPOFOL ; N/A     Comment:  Procedure: COLONOSCOPY WITH PROPOFOL ;  Surgeon: Onita Elspeth Sharper, DO;  Location: [facility] ENDOSCOPY;  Service:               Gastroenterology;  Laterality: N/A; 04/22/2023: ESOPHAGOGASTRODUODENOSCOPY (EGD) WITH PROPOFOL ; N/A     Comment:  Procedure: ESOPHAGOGASTRODUODENOSCOPY (EGD) WITH               PROPOFOL ;  Surgeon: Onita Elspeth Sharper, DO;  Location:              [facility] ENDOSCOPY;  Service: Gastroenterology;  Laterality:               N/A; 10/30/2019: PART MASTECTOMY,RADIO FREQUENCY LOCALIZER,AXILLARY  SENTINEL NODE BIOPSY; Right     Comment:  Procedure: PART MASTECTOMY,RADIO FREQUENCY               LOCALIZER,AXILLARY SENTINEL  NODE BIOPSY;  Surgeon:               Rodolph Romano, MD;  Location: [facility] ORS;  Service:              General;  Laterality: Right; 04/22/2023: POLYPECTOMY     Comment:  Procedure: POLYPECTOMY;  Surgeon: Onita Elspeth Sharper,              DO;  Location: [facility] ENDOSCOPY;  Service:               Gastroenterology;; BMI    Body Mass Index: 34.30 kg/m     Reproductive/Obstetrics negative OB ROS                              Anesthesia Physical Anesthesia Plan  ASA: 3  Anesthesia Plan: Spinal   Post-op Pain Management:    Induction:   PONV Risk Score and Plan: 3  Airway Management Planned:   Additional Equipment:   Intra-op Plan:   Post-operative Plan:   Informed Consent: I have reviewed the patients History and Physical, chart, labs and discussed the procedure including the risks,  benefits and alternatives for the proposed anesthesia with the patient or authorized representative who has indicated his/her understanding and acceptance.     Dental Advisory Given  Plan Discussed with: CRNA  Anesthesia Plan Comments:         Anesthesia Quick Evaluation  "

## 2024-02-18 NOTE — Transfer of Care (Signed)
 Immediate Anesthesia Transfer of Care Note  Patient: Barbara [name]  Procedure(s) Performed: ARTHROPLASTY, KNEE, TOTAL, USING IMAGELESS COMPUTER-ASSISTED NAVIGATION (Right: Knee)  Patient Location: PACU  Anesthesia Type:General  Level of Consciousness: drowsy  Airway & Oxygen Therapy: Patient Spontanous Breathing and Patient connected to nasal cannula oxygen  Post-op Assessment: Report given to RN and Post -op Vital signs reviewed and stable  Post vital signs: Reviewed and stable  Last Vitals:  Vitals Value Taken Time  BP 94/53 02/18/24 10:55  Temp    Pulse 65 02/18/24 10:57  Resp 20 02/18/24 10:57  SpO2 96 % 02/18/24 10:57  Vitals shown include unfiled device data.  Last Pain:  Vitals:   02/18/24 0625  TempSrc: Temporal  PainSc: 3       Patients Stated Pain Goal: 0 (02/18/24 9374)  Complications: No notable events documented.

## 2024-02-18 NOTE — Anesthesia Procedure Notes (Signed)
 Spinal  Patient location during procedure: OR Start time: 02/18/2024 7:20 AM End time: 02/18/2024 7:22 AM Reason for block: surgical anesthesia  Staffing Performed: resident/CRNA  Authorized by: Dario Barter, MD   Performed by: Niki Manus SAUNDERS, CRNA  Preanesthetic Checklist Completed: patient identified, IV checked, site marked, risks and benefits discussed, surgical consent, monitors and equipment checked, pre-op evaluation and timeout performed Spinal Block Patient position: sitting Prep: ChloraPrep Patient monitoring: heart rate, continuous pulse ox, blood pressure and cardiac monitor Approach: midline Location: L4-5 Injection technique: single-shot Needle Needle type: Introducer and Pencan  Needle gauge: 25 G Needle length: 10 cm Assessment Sensory level: T6 Events: CSF return  Additional Notes Negative paresthesia. Negative blood return. Positive free-flowing CSF. Expiration date of kit checked and confirmed. Patient tolerated procedure well, without complications.

## 2024-02-18 NOTE — Progress Notes (Signed)
 Patient has mobility impairment for daily activities. A rolling walker will resolve this and the patient is safe to use it  [employer]. Myrtle Haller, Jr., M.D.

## 2024-02-19 ENCOUNTER — Other Ambulatory Visit: Payer: Self-pay

## 2024-02-19 ENCOUNTER — Encounter: Payer: Self-pay | Admitting: Oncology

## 2024-02-19 DIAGNOSIS — M1711 Unilateral primary osteoarthritis, right knee: Secondary | ICD-10-CM | POA: Diagnosis not present

## 2024-02-19 MED ORDER — ACETAMINOPHEN 325 MG PO TABS: 325.0000 mg | ORAL_TABLET | Freq: Four times a day (QID) | ORAL | Status: AC | PRN

## 2024-02-19 MED ORDER — TRAMADOL HCL 50 MG PO TABS
50.0000 mg | ORAL_TABLET | ORAL | 0 refills | Status: AC | PRN
  Filled 2024-02-19: qty 30, 5d supply, fill #0

## 2024-02-19 MED ORDER — CELECOXIB 200 MG PO CAPS
200.0000 mg | ORAL_CAPSULE | Freq: Two times a day (BID) | ORAL | 0 refills | Status: AC
  Filled 2024-02-19: qty 90, 45d supply, fill #0

## 2024-02-19 MED ORDER — OXYCODONE HCL 5 MG PO TABS
5.0000 mg | ORAL_TABLET | ORAL | 0 refills | Status: AC | PRN
  Filled 2024-02-19: qty 30, 5d supply, fill #0

## 2024-02-19 MED ORDER — ASPIRIN 81 MG PO CHEW: 81.0000 mg | CHEWABLE_TABLET | Freq: Two times a day (BID) | ORAL | Status: AC

## 2024-02-19 NOTE — Plan of Care (Signed)
   Problem: Activity: Goal: Risk for activity intolerance will decrease Outcome: Progressing   Problem: Nutrition: Goal: Adequate nutrition will be maintained Outcome: Progressing   Problem: Elimination: Goal: Will not experience complications related to bowel motility Outcome: Progressing   Problem: Pain Managment: Goal: General experience of comfort will improve and/or be controlled Outcome: Progressing   Problem: Safety: Goal: Ability to remain free from injury will improve Outcome: Progressing   Problem: Skin Integrity: Goal: Risk for impaired skin integrity will decrease Outcome: Progressing

## 2024-02-19 NOTE — Progress Notes (Signed)
 Patient has been discharged to home with spouse at bedside. She is alert and oriented x4. Patient will be leaving with ice pack cooler post surgery. Medicines to bed have been delivered. IV removed. Catheter and tip intact. Site covered with gauze and tape. Discharge instructions given to spouse; both with understanding and acknowledgment. Staff to transport patient to lobby for exit.

## 2024-02-19 NOTE — Plan of Care (Signed)

## 2024-02-19 NOTE — Discharge Summary (Signed)
 " Physician Discharge Summary  Patient ID: Barbara [name] MRN: [id number] DOB/AGE: 1950/09/26 74 y.o.  Admit date: 02/18/2024 Discharge date: 02/19/2024  Admission Diagnoses:  Primary osteoarthritis of right knee [M17.11] History of total knee arthroplasty, right [Z96.651]   Discharge Diagnoses: Patient Active Problem List   Diagnosis Date Noted   Hiatal hernia with gastroesophageal reflux 02/18/2024   History of total knee arthroplasty, right 02/18/2024   AVM (arteriovenous malformation) of colon 07/21/2023   Diverticulosis large intestine w/o perforation or abscess w/o bleeding 07/21/2023   Family history of colon cancer 07/21/2023   Family history of colon polyps, unspecified 07/21/2023   Internal hemorrhoids 07/21/2023   Schatzki's ring 07/21/2023   Esophageal dysphagia 07/21/2023   Esophageal spasm 07/21/2023   Gastritis 07/21/2023   CKD (chronic kidney disease) stage 3, GFR 30-59 ml/min (HCC) 03/30/2023   Aromatase inhibitor use 03/21/2021   OSA on CPAP 03/10/2021   CPAP use counseling 03/10/2021   Trigger finger of left thumb 02/21/2021   Osteopenia 02/15/2020   Elevated fasting blood sugar 11/28/2019   GERD (gastroesophageal reflux disease) 11/28/2019   Hyperlipidemia 11/28/2019   Hypertension, essential, benign 11/28/2019   Genetic testing 11/21/2019   Family history of breast cancer    Family history of pancreatic cancer    Family history of uterine cancer    Family history of lung cancer    Malignant neoplasm of upper-inner quadrant of right breast in female, estrogen receptor positive (HCC) 10/26/2019   Obesity (BMI 30.0-34.9) 08/30/2017   Sleep apnea 02/21/2014   Arthritis, senescent 08/07/2013    Past Medical History:  Diagnosis Date   Actinic keratosis    Arthritis    Breast cancer (HCC)    Cancer (HCC)    Family history of adverse reaction to anesthesia    daughter was slow to wake up   Family history of breast cancer    Family history of lung  cancer    Family history of pancreatic cancer    Family history of uterine cancer    GERD (gastroesophageal reflux disease)    Headache    migraines   Hypertension    Melanoma (HCC) 08/08/2020   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, R lat mid back, exc 09/18/20   Osteopenia 02/15/2020   Personal history of radiation therapy    Pre-diabetes    Sleep apnea    uses cpap      Consultants (if any):   Discharged Condition: Improved  Hospital Course: Barbara [name] is an 74 y.o. female who was admitted 02/18/2024 with a diagnosis of History of total knee arthroplasty, right and went to the operating room on 02/18/2024 and underwent the below named procedures.    Surgeries: Procedures: ARTHROPLASTY, KNEE, TOTAL, USING IMAGELESS COMPUTER-ASSISTED NAVIGATION on 02/18/2024 Patient tolerated the surgery well. Taken to PACU where she was stabilized and then transferred to the orthopedic floor.  Started on ASA 81mg  BID. Heels elevated on bed with rolled towels. No evidence of DVT. Negative Homan. Physical therapy started on day #1 for gait training and transfer. OT started day #1 for ADL and assisted devices.  Patient's IV ,and hemovac was d/c on day #1. Foley was removed shortly after surgery.   Implants: DePuy Attune size 6N posterior stabilized femoral component (cemented), size 4 rotating platform tibial component (cemented), 35 mm medialized dome patella (cemented), and an 8 mm stabilized rotating platform polyethylene insert.   She was given perioperative antibiotics:  Anti-infectives (From admission, onward)    Start  Dose/Rate Route Frequency Ordered Stop   02/18/24 1430  ceFAZolin  (ANCEF ) IVPB 2g/100 mL premix        2 g 200 mL/hr over 30 Minutes Intravenous Every 6 hours 02/18/24 1330 02/18/24 2107   02/18/24 0630  ceFAZolin  (ANCEF ) IVPB 2g/100 mL premix        2 g 200 mL/hr over 30 Minutes Intravenous On call to O.R. 02/18/24 9380 02/18/24 0735     .  She was given sequential  compression devices, early ambulation, and ASA for DVT prophylaxis.  She benefited maximally from the hospital stay and there were no complications.    Recent vital signs:  Vitals:   02/19/24 0406 02/19/24 0715  BP: (!) 103/54 102/60  Pulse: 72 67  Resp: 18 19  Temp: 98.5 F (36.9 C) 98.6 F (37 C)  SpO2: 95% 93%    Recent laboratory studies:  Lab Results  Component Value Date   HGB 13.2 02/10/2024   HGB 12.8 09/28/2023   HGB 12.9 03/30/2023   Lab Results  Component Value Date   WBC 5.1 02/10/2024   PLT 206 02/10/2024   No results found for: INR Lab Results  Component Value Date   NA 141 02/10/2024   K 3.9 02/10/2024   CL 105 02/10/2024   CO2 25 02/10/2024   BUN 14 02/10/2024   CREATININE 0.88 02/10/2024   GLUCOSE 111 (H) 02/10/2024    Discharge Medications:   Allergies as of 02/19/2024   No Known Allergies      Medication List     STOP taking these medications    aspirin  EC 81 MG tablet Replaced by: aspirin  81 MG chewable tablet   meloxicam 15 MG tablet Commonly known as: MOBIC       TAKE these medications    acetaminophen  325 MG tablet Commonly known as: TYLENOL  Take 1-2 tablets (325-650 mg total) by mouth every 6 ([name]) hours as needed for mild pain (pain score 1-3) (or temp > 100.5). What changed:  medication strength how much to take reasons to take this   amLODipine  5 MG tablet Commonly known as: NORVASC  Take 5 mg by mouth at bedtime.   anastrozole  1 MG tablet Commonly known as: ARIMIDEX  Take 1 tablet by mouth once daily   aspirin  81 MG chewable tablet Chew 1 tablet (81 mg total) by mouth 2 (two) times daily. Replaces: aspirin  EC 81 MG tablet   azelastine  0.1 % nasal spray Commonly known as: ASTELIN  Place 1 spray into both nostrils daily as needed for rhinitis.   Calcium-Vitamin D 600-20 MG-MCG Tabs Take 1 tablet by mouth daily.   celecoxib  200 MG capsule Commonly known as: CELEBREX  Take 1 capsule (200 mg total) by mouth  2 (two) times daily.   cetirizine 10 MG tablet Commonly known as: ZYRTEC Take 10 mg by mouth every morning.   cholecalciferol 25 MCG (1000 UNIT) tablet Commonly known as: VITAMIN D3 Take 1,000 Units by mouth daily.   CINNAMON PO Take 1,000 mg by mouth in the morning and at bedtime.   Fluocinolone  Acetonide 0.01 % Oil Apply 1-2 times a day as needed to ear canals.   Krill Oil 500 MG Caps Take 500 mg by mouth daily.   lisinopril  2.5 MG tablet Commonly known as: ZESTRIL  Take 2.5 mg by mouth daily.   lovastatin 40 MG tablet Commonly known as: MEVACOR Take 40 mg by mouth at bedtime.   mometasone  0.1 % cream Commonly known as: ELOCON  APPLY TO THE AFFECTED AREAS  OF EARS DAILY AS NEEDED FOR RASH FOR FLARES   oxyCODONE  5 MG immediate release tablet Commonly known as: Oxy IR/ROXICODONE  Take 1 tablet (5 mg total) by mouth every 4 (four) hours as needed for moderate pain (pain score 4-6) (pain score 4-6).   pantoprazole  40 MG tablet Commonly known as: PROTONIX  Take 40 mg by mouth every morning.   traMADol  50 MG tablet Commonly known as: ULTRAM  Take 1-2 tablets (50-100 mg total) by mouth every 4 (four) hours as needed for moderate pain (pain score 4-6).               Durable Medical Equipment  (From admission, onward)           Start     Ordered   02/18/24 1330  DME Walker rolling  Once       Question:  Patient needs a walker to treat with the following condition  Answer:  Total knee replacement status   02/18/24 1329   02/18/24 1330  DME Bedside commode  Once       Comments: Patient is not able to walk the distance required to go the bathroom, or he/she is unable to safely negotiate stairs required to access the bathroom.  A 3in1 BSC will alleviate this problem  Question:  Patient needs a bedside commode to treat with the following condition  Answer:  Total knee replacement status   02/18/24 1329            Diagnostic Studies: DG Knee Right Port Result  Date: 02/18/2024 CLINICAL DATA:  Status post right total knee arthroplasty. EXAM: PORTABLE RIGHT KNEE - 1-2 VIEW COMPARISON:  MRI right knee dated 06/29/2019. FINDINGS: Status post right total knee arthroplasty with normal alignment. No evidence of acute complication. 11 mm ossicle adjacent to the medial femoral condyle may relate to prior ligamentous trauma. Postoperative soft tissue swelling and soft tissue air with cutaneous staples and drain in place. IMPRESSION: Status post right total knee arthroplasty with normal alignment. Electronically Signed   By: Harrietta Sherry M.D.   On: 02/18/2024 11:50    Disposition: Discharge disposition: 01-Home or Self Care          Follow-up Information     Castanheiro de Charlynn Dunk, Vick V, [place] Follow up on 03/03/2024.   Specialty: Orthopedic Surgery Why: at 2:00pm Contact information: [address] [place] [address] [phone]         Mardee Lynwood SQUIBB, MD Follow up on 03/30/2024.   Specialty: Orthopedic Surgery Why: at 1:45pm Contact information: [address] [facility] [place] [address] [phone]                 Cleared for discharge pending PT progress with ascending/descending stairs  Signed: Vick Dunk, PA-C 02/19/2024, 9:56 AM  "

## 2024-02-19 NOTE — TOC Initial Note (Addendum)
 Transition of Care [facility]) - Initial/Assessment Note    Patient Details  Name: Barbara [name] MRN: [id number] Date of Birth: 10-23-1950  Transition of Care [facility]) CM/SW Contact:    Jemuel Laursen L Barbi Kumagai, LCSW Phone Number: 02/19/2024, 11:31 AM  Clinical Narrative:                    Home Health services pre-arranged with [facility] by the surgeon's office.   DME ordered through [employer] for delivery to patients room.      Patient Goals and CMS Choice            Expected Discharge Plan and Services         Expected Discharge Date: 02/19/24                                    Prior Living Arrangements/Services                       Activities of Daily Living   ADL Screening (condition at time of admission) Independently performs ADLs?: Yes (appropriate for developmental age) Is the patient deaf or have difficulty hearing?: No Does the patient have difficulty seeing, even when wearing glasses/contacts?: No Does the patient have difficulty concentrating, remembering, or making decisions?: No  Permission Sought/Granted                  Emotional Assessment              Admission diagnosis:  Primary osteoarthritis of right knee [M17.11] History of total knee arthroplasty, right [Z96.651] Patient Active Problem List   Diagnosis Date Noted   Hiatal hernia with gastroesophageal reflux 02/18/2024   History of total knee arthroplasty, right 02/18/2024   AVM (arteriovenous malformation) of colon 07/21/2023   Diverticulosis large intestine w/o perforation or abscess w/o bleeding 07/21/2023   Family history of colon cancer 07/21/2023   Family history of colon polyps, unspecified 07/21/2023   Internal hemorrhoids 07/21/2023   Schatzki's ring 07/21/2023   Esophageal dysphagia 07/21/2023   Esophageal spasm 07/21/2023   Gastritis 07/21/2023   CKD (chronic kidney disease) stage 3, GFR 30-59 ml/min (HCC) 03/30/2023   Aromatase inhibitor  use 03/21/2021   OSA on CPAP 03/10/2021   CPAP use counseling 03/10/2021   Trigger finger of left thumb 02/21/2021   Osteopenia 02/15/2020   Elevated fasting blood sugar 11/28/2019   GERD (gastroesophageal reflux disease) 11/28/2019   Hyperlipidemia 11/28/2019   Hypertension, essential, benign 11/28/2019   Genetic testing 11/21/2019   Family history of breast cancer    Family history of pancreatic cancer    Family history of uterine cancer    Family history of lung cancer    Malignant neoplasm of upper-inner quadrant of right breast in female, estrogen receptor positive (HCC) 10/26/2019   Obesity (BMI 30.0-34.9) 08/30/2017   Sleep apnea 02/21/2014   Arthritis, senescent 08/07/2013   PCP:  Jeffie Cheryl BRAVO, MD Pharmacy:   [facility] [address], [place] - [address] [place] [address] Phone: [phone] Fax: [phone]     Social Drivers of Health (SDOH) Social History: SDOH Screenings   Food Insecurity: No Food Insecurity (02/18/2024)  Housing: Low Risk (02/18/2024)  Transportation Needs: No Transportation Needs (02/18/2024)  Utilities: Not At Risk (02/18/2024)  Depression (PHQ2-9): Low Risk (09/28/2023)  Financial Resource Strain: Low Risk  (02/08/2024)  Received from [facility]  Social Connections: Socially Integrated (02/18/2024)  Tobacco Use: Low Risk (02/18/2024)   SDOH Interventions:     Readmission Risk Interventions     No data to display

## 2024-02-19 NOTE — Progress Notes (Signed)
 Physical Therapy Treatment Patient Details Name: Barbara [name] MRN: [id number] DOB: 1951/01/17 Today's Date: 02/19/2024   History of Present Illness Pt is a 74 yo female s/p R TKA. PMH of sleep apnea, HTN, breast cancer, GERD.    PT Comments  Patient found seated in chair with spouse present in room. Patient reports slight dizziness but overall okay. Patient provided knee rehab protocol exercise packet with patient completing heel slides, quad sets, LAQ, hip abduction, and SLR for 5 reps each on RLE with no adverse complications. Patient did report increase in pain when performing seated LAQ, but able to complete. Patient completed ambulation with rolling walker with chair to follow for approx 70 feet with no adverse changes in gait pattern since patient seen in the AM. Patient completed step training for approx 4 steps with BUE hand rail support at CGA coupled with step to pattern. Patient provided education on proper stepping sequence with patient displaying good competency and verbal competency towards stepping sequence. Patient reports pain increased to 7/10 in RLE post session. Patient left seated in recliner with BLE elevated and polar ice donned on RLE with call bell, and phone in close proximity with spouse present in room.   If plan is discharge home, recommend the following: A little help with walking and/or transfers;A little help with bathing/dressing/bathroom   Can travel by private vehicle        Equipment Recommendations  Rolling walker (2 wheels)    Recommendations for Other Services       Precautions / Restrictions Precautions Precautions: Fall;Knee Precaution Booklet Issued: Yes (comment) Recall of Precautions/Restrictions: Intact Restrictions Weight Bearing Restrictions Per Provider Order: Yes RLE Weight Bearing Per Provider Order: Weight bearing as tolerated     Mobility  Bed Mobility Bed mobility not performed due to patient received in recliner upon arrival       Supine to sit: Used rails, HOB elevated, Modified independent (Device/Increase time)          Transfers Overall transfer level: Needs assistance Equipment used: Rolling walker (2 wheels) Transfers: Sit to/from Stand Sit to Stand: Contact guard assist           General transfer comment: verbal cues for technique    Ambulation/Gait Ambulation/Gait assistance: Contact guard assist Gait Distance (Feet): 70 Feet Assistive device: Rolling walker (2 wheels) Gait Pattern/deviations: Step-through pattern Gait velocity: decreased     General Gait Details:  (Forward leaning trunk, heavy usage of BUE upon advancing)   Stairs Stairs: Yes Stairs assistance: Min assist Stair Management: Two rails, Step to pattern       Wheelchair Mobility     Tilt Bed    Modified Rankin (Stroke Patients Only)       Balance Overall balance assessment: Needs assistance Sitting-balance support: Feet supported Sitting balance-Leahy Scale: Good                                      Communication Communication Communication: No apparent difficulties  Cognition Arousal: Alert Behavior During Therapy: WFL for tasks assessed/performed   PT - Cognitive impairments: No apparent impairments                         Following commands: Intact      Cueing Cueing Techniques: Verbal cues  Exercises General Exercises - Lower Extremity Quad Sets: 5 reps, Right Long Arc Quad: 5 reps, Right  Heel Slides: 5 reps, Right Hip ABduction/ADduction: 5 reps, Right Straight Leg Raises: 5 reps, Right    General Comments        Pertinent Vitals/Pain Pain Assessment Pain Assessment: 0-10 Pain Score: 0-No pain Pain Location: R knee Pain Descriptors / Indicators: Aching, Sore Pain Intervention(s): Monitored during session    Home Living Family/patient expects to be discharged to:: Private residence Living Arrangements: Spouse/significant other Available Help at  Discharge: Family Type of Home: House Home Access: Stairs to enter Entrance Stairs-Rails: None Secretary/administrator of Steps: 1   Home Layout: One level Home Equipment: Information systems manager      Prior Function            PT Goals (current goals can now be found in the care plan section) Acute Rehab PT Goals Patient Stated Goal: to go home PT Goal Formulation: With patient Potential to Achieve Goals: Good Progress towards PT goals: Progressing toward goals    Frequency    BID      PT Plan      Co-evaluation              AM-PAC PT 6 Clicks Mobility   Outcome Measure  Help needed turning from your back to your side while in a flat bed without using bedrails?: None Help needed moving from lying on your back to sitting on the side of a flat bed without using bedrails?: None Help needed moving to and from a bed to a chair (including a wheelchair)?: None Help needed standing up from a chair using your arms (e.g., wheelchair or bedside chair)?: A Little Help needed to walk in hospital room?: A Little   6 Click Score: 18    End of Session   Activity Tolerance: Patient tolerated treatment well Patient left: in chair;with call bell/phone within reach;with family/visitor present Nurse Communication: Mobility status PT Visit Diagnosis: Other abnormalities of gait and mobility (R26.89);Difficulty in walking, not elsewhere classified (R26.2);Muscle weakness (generalized) (M62.81);Pain Pain - Right/Left: Right Pain - part of body: Knee     Time: 8892-8866 PT Time Calculation (min) (ACUTE ONLY): 26 min  Charges:    $Gait Training: 8-22 mins $Therapeutic Exercise: 8-22 mins $Therapeutic Activity: 8-22 mins PT General Charges $$ ACUTE PT VISIT: 1 Visit                     Waddell Lesches, PTA

## 2024-02-19 NOTE — Progress Notes (Signed)
 Physical Therapy Treatment Patient Details Name: NILZA EAKER MRN: [id number] DOB: [birth year] Today's Date: 02/19/2024   History of Present Illness Pt is a 74 yo female s/p R TKA. PMH of sleep apnea, HTN, breast cancer, GERD.    PT Comments  Patient found supine in bed with HOB elevated with patient spouse present. Patient reports pain at 2/10 pre activity in RLE with patient agreeable to therapy services. Patient completed supine to EOB at modified independence with usage of bed rails. Patient completed STS from EOB at [facility] with verbal cuing for hand placement with good carryover. Patient completed ambulation for 60 feet at [facility] displaying step through pattern with effective heel/toe sequence providing cuing for posture to reduce forward leaning with inconsistent carryover with heavy WB in RUE on walker upon advancement. Patient reporting pain increasing to 7/10 post ambulation but denies any dizziness. Patient left supine in bed with RLE elevated and HOB elevated with no signs of distress on rolled towel and polar ice machine set up with call bell/phone in reach and bed alarm set.    If plan is discharge home, recommend the following: A little help with bathing/dressing/bathroom;Assistance with cooking/housework;Assist for transportation;Help with stairs or ramp for entrance;A little help with walking and/or transfers   Can travel by private vehicle        Equipment Recommendations  Rolling walker (2 wheels);BSC/3in1    Recommendations for Other Services       Precautions / Restrictions Precautions Precautions: Fall;Knee Precaution Booklet Issued: Yes (comment) Recall of Precautions/Restrictions: Intact Restrictions Weight Bearing Restrictions Per Provider Order: Yes RLE Weight Bearing Per Provider Order: Weight bearing as tolerated     Mobility  Bed Mobility Overal bed mobility: Modified Independent Bed Mobility: Supine to Sit     Supine to sit: Used rails, HOB elevated,  Modified independent (Device/Increase time)          Transfers Overall transfer level: Needs assistance Equipment used: Rolling walker (2 wheels) Transfers: Sit to/from Stand Sit to Stand: Contact guard assist           General transfer comment: verbal cues for technique    Ambulation/Gait Ambulation/Gait assistance: Contact guard assist Gait Distance (Feet): 60 Feet Assistive device: Rolling walker (2 wheels) Gait Pattern/deviations: Step-through pattern Gait velocity: decreased     General Gait Details:  (Forward leaning trunk, heavy usage of BUE upon advancing)   Stairs             Wheelchair Mobility     Tilt Bed    Modified Rankin (Stroke Patients Only)       Balance Overall balance assessment: Needs assistance Sitting-balance support: Feet supported                                        Communication Communication Communication: No apparent difficulties  Cognition Arousal: Alert Behavior During Therapy: WFL for tasks assessed/performed   PT - Cognitive impairments: No apparent impairments                         Following commands: Intact      Cueing Cueing Techniques: Verbal cues  Exercises      General Comments        Pertinent Vitals/Pain Pain Assessment Pain Assessment: 0-10 Pain Score: 2  Pain Location: R knee Pain Descriptors / Indicators: Aching, Sore Pain Intervention(s): Monitored during session  Home Living                          Prior Function            PT Goals (current goals can now be found in the care plan section) Acute Rehab PT Goals Patient Stated Goal: to go home PT Goal Formulation: With patient Potential to Achieve Goals: Good Progress towards PT goals: Progressing toward goals    Frequency    BID      PT Plan      Co-evaluation              AM-PAC PT 6 Clicks Mobility   Outcome Measure  Help needed turning from your back to your  side while in a flat bed without using bedrails?: None Help needed moving from lying on your back to sitting on the side of a flat bed without using bedrails?: None Help needed moving to and from a bed to a chair (including a wheelchair)?: None Help needed standing up from a chair using your arms (e.g., wheelchair or bedside chair)?: A Little Help needed to walk in hospital room?: A Little   6 Click Score: 18    End of Session   Activity Tolerance: Patient tolerated treatment well Patient left: in bed;with call bell/phone within reach;with bed alarm set Nurse Communication: Mobility status PT Visit Diagnosis: Other abnormalities of gait and mobility (R26.89);Difficulty in walking, not elsewhere classified (R26.2);Muscle weakness (generalized) (M62.81);Pain Pain - Right/Left: Right Pain - part of body: Knee     Time: 0900-0920 PT Time Calculation (min) (ACUTE ONLY): 20 min  Charges:    $Gait Training: 8-22 mins PT General Charges $$ ACUTE PT VISIT: 1 Visit                     Waddell Lesches, PTA

## 2024-02-19 NOTE — Evaluation (Signed)
 Occupational Therapy Evaluation Patient Details Name: Barbara [name] MRN: [id number] DOB: [birth year] Today's Date: 02/19/2024   History of Present Illness   Pt is a 74 yo female s/p R TKA. PMH of sleep apnea, HTN, breast cancer, GERD.     Clinical Impressions Patient was seen for OT evaluation this date. Prior to hospital admission, patient was active and independent, ambulated without AD and could manage ADLs/IADLs without physical A. Patient lives in single story home with spouse who is able and willing to provide as much assist/  support as needed. Patient performed bed mobility with mod I, sit<>stand with CGA, ambulated to bathroom with CGA/SBA with r/w. Performed toileting with CGA/SBA.  Patient presents with deficits in dynamic standing balance, LB mobility and overall activity tolerance, affecting safe and optimal ADL completion. Patient is currently requiring mod A for LB self care tasks and CGA/SBA while in stance for ADL.  Paient would benefit from skilled OT services to address noted impairments and functional limitations (see below for any additional details) in order to maximize safety and independence while minimizing future risk of falls, injury, and readmission. Anticipate the need for follow up OT services upon acute hospital DC.      If plan is discharge home, recommend the following:   A little help with walking and/or transfers;A little help with bathing/dressing/bathroom     Functional Status Assessment   Patient has had a recent decline in their functional status and demonstrates the ability to make significant improvements in function in a reasonable and predictable amount of time.     Equipment Recommendations   BSC/3in1     Recommendations for Other Services         Precautions/Restrictions   Precautions Precautions: Fall;Knee Precaution Booklet Issued: Yes (comment) Recall of Precautions/Restrictions: Intact Restrictions Weight Bearing  Restrictions Per Provider Order: Yes RLE Weight Bearing Per Provider Order: Weight bearing as tolerated     Mobility Bed Mobility Overal bed mobility: Modified Independent Bed Mobility: Supine to Sit     Supine to sit: Used rails, HOB elevated, Modified independent (Device/Increase time)          Transfers Overall transfer level: Needs assistance Equipment used: Rolling walker (2 wheels) Transfers: Sit to/from Stand Sit to Stand: Contact guard assist           General transfer comment: verbal cues for technique      Balance Overall balance assessment: Needs assistance Sitting-balance support: Feet supported Sitting balance-Leahy Scale: Good     Standing balance support: Single extremity supported Standing balance-Leahy Scale: Good                             ADL either performed or assessed with clinical judgement   ADL Overall ADL's : Needs assistance/impaired                                       General ADL Comments: performed toileting with supervision/CGA; anticpate needing mod A for LB dressing to thread BLE into LB clothing     Vision         Perception         Praxis         Pertinent Vitals/Pain Pain Assessment Pain Assessment: 0-10 Pain Score: 4  Pain Location: R knee Pain Descriptors / Indicators: Aching, Sore Pain Intervention(s): Monitored during session  Extremity/Trunk Assessment Upper Extremity Assessment Upper Extremity Assessment: Overall WFL for tasks assessed   Lower Extremity Assessment Lower Extremity Assessment: Defer to PT evaluation       Communication Communication Communication: No apparent difficulties   Cognition Arousal: Alert Behavior During Therapy: WFL for tasks assessed/performed Cognition: No apparent impairments                               Following commands: Intact       Cueing  General Comments   Cueing Techniques: Verbal cues       Exercises     Shoulder Instructions      Home Living Family/patient expects to be discharged to:: Private residence Living Arrangements: Spouse/significant other Available Help at Discharge: Family Type of Home: House Home Access: Stairs to enter Secretary/administrator of Steps: 1 Entrance Stairs-Rails: None Home Layout: One level     Bathroom Shower/Tub: Chief Strategy Officer: Standard     Home Equipment: Information systems manager          Prior Functioning/Environment Prior Level of Function : Independent/Modified Independent             Mobility Comments: independent ADLs Comments: indpenednet    OT Problem List: Decreased strength;Decreased activity tolerance   OT Treatment/Interventions: Energy conservation;DME and/or AE instruction;Therapeutic exercise;Self-care/ADL training;Patient/family education      OT Goals(Current goals can be found in the care plan section)   Acute Rehab OT Goals Patient Stated Goal: to go home OT Goal Formulation: With patient/family Time For Goal Achievement: 03/04/24 Potential to Achieve Goals: Good ADL Goals Pt Will Perform Grooming: with set-up;standing Pt Will Perform Lower Body Dressing: with set-up;sit to/from stand Pt Will Transfer to Toilet: with set-up;ambulating;regular height toilet Pt Will Perform Toileting - Clothing Manipulation and hygiene: with set-up;sit to/from stand   OT Frequency:  Min 2X/week    Co-evaluation              AM-PAC OT 6 Clicks Daily Activity     Outcome Measure Help from another person eating meals?: None Help from another person taking care of personal grooming?: A Little Help from another person toileting, which includes using toliet, bedpan, or urinal?: A Little Help from another person bathing (including washing, rinsing, drying)?: A Little Help from another person to put on and taking off regular upper body clothing?: None Help from another person to put on and taking  off regular lower body clothing?: A Little 6 Click Score: 20   End of Session Equipment Utilized During Treatment: Rolling walker (2 wheels) Nurse Communication: Mobility status  Activity Tolerance: Patient tolerated treatment well Patient left: in chair;with call bell/phone within reach;with family/visitor present  OT Visit Diagnosis: Muscle weakness (generalized) (M62.81)                Time: 8973-8952 OT Time Calculation (min): 21 min Charges:  OT General Charges $OT Visit: 1 Visit OT Evaluation $OT Eval Low Complexity: 1 Low  Rogers Clause, OT/L [facility], 02/19/2024

## 2024-02-19 NOTE — Progress Notes (Signed)
 Subjective: 1 Day Post-Op Procedures (LRB): ARTHROPLASTY, KNEE, TOTAL, USING IMAGELESS COMPUTER-ASSISTED NAVIGATION (Right) Patient reports pain as mild.   Patient is well, and has had no acute complaints or problems Denies any CP, SOB, N/V, fevers or chills We will start therapy today.  Plan is to go Home after hospital stay.  Objective: Vital signs in last 24 hours: Temp:  [97 F (36.1 C)-99.4 F (37.4 C)] 98.6 F (37 C) (01/17 0715) Pulse Rate:  [52-82] 67 (01/17 0715) Resp:  [11-20] 19 (01/17 0715) BP: (90-123)/(51-69) 102/60 (01/17 0715) SpO2:  [93 %-100 %] 93 % (01/17 0715) FiO2 (%):  [21 %] 21 % (01/16 2200)  Intake/Output from previous day:  Intake/Output Summary (Last 24 hours) at 02/19/2024 0953 Last data filed at 02/18/2024 1851 Gross per 24 hour  Intake 1551.56 ml  Output 680 ml  Net 871.56 ml    Intake/Output this shift: No intake/output data recorded.  Labs: No results for input(s): HGB in the last 72 hours. No results for input(s): WBC, RBC, HCT, PLT in the last 72 hours. No results for input(s): NA, K, CL, CO2, BUN, CREATININE, GLUCOSE, CALCIUM in the last 72 hours. No results for input(s): LABPT, INR in the last 72 hours.  EXAM General - Patient is alert, appropriate, and oriented. Extremity - Neurologically intact ABD soft Neurovascular intact Sensation intact distally Intact pulses distally Dorsiflexion/Plantar flexion intact Dressing -  dressing C/D/I. Polar care unit in place. JP Drain pulled without difficulty. Intact Motor Function - intact, moving foot and toes well on exam. Straight leg raise independently.   Past Medical History:  Diagnosis Date   Actinic keratosis    Arthritis    Breast cancer (HCC)    Cancer (HCC)    Family history of adverse reaction to anesthesia    daughter was slow to wake up   Family history of breast cancer    Family history of lung cancer    Family history of pancreatic cancer     Family history of uterine cancer    GERD (gastroesophageal reflux disease)    Headache    migraines   Hypertension    Melanoma (HCC) 08/08/2020   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, R lat mid back, exc 09/18/20   Osteopenia 02/15/2020   Personal history of radiation therapy    Pre-diabetes    Sleep apnea    uses cpap    Assessment/Plan: 1 Day Post-Op Procedures (LRB): ARTHROPLASTY, KNEE, TOTAL, USING IMAGELESS COMPUTER-ASSISTED NAVIGATION (Right) Principal Problem:   History of total knee arthroplasty, right  Estimated body mass index is 34.3 kg/m as calculated from the following:   Height as of this encounter: 5' 5 (1.651 m).   Weight as of this encounter: 93.5 kg. Advance diet Up with therapy D/C IV fluids Discharge home with home health  Patient will continue to work with physical therapy to pass postoperative PT protocols, ROM and strengthening  Discussed with the patient continuing to utilize Polar Care  Patient will use bone foam in 20-30 minute intervals  Patient will wear TED hose bilaterally to help prevent DVT and clot formation  Discussed the Aquacel bandage.  This bandage will stay in place 7 days postoperatively.  Can be replaced with honeycomb bandages that will be sent home with the patient  Discussed sending the patient home with tramadol  and oxycodone  for as needed pain management.  Patient will also be sent home with Celebrex  to help with swelling and inflammation.  Patient will take an 42  mg aspirin  twice daily for DVT prophylaxis  JP drain removed without difficulty, intact  Weight-Bearing as tolerated to right leg  Cleared for discharge. Patient will follow-up with [facility] in 2 weeks for staple removal and reevaluation  Vick Dunk, PA-C [facility] 02/19/2024, 9:53 AM

## 2024-02-21 ENCOUNTER — Encounter: Payer: Self-pay | Admitting: Orthopedic Surgery

## 2024-02-22 NOTE — Anesthesia Postprocedure Evaluation (Signed)
"   Anesthesia Post Note  Patient: ODESSIA ASLESON  Procedure(s) Performed: ARTHROPLASTY, KNEE, TOTAL, USING IMAGELESS COMPUTER-ASSISTED NAVIGATION (Right: Knee)  Patient location during evaluation: PACU Anesthesia Type: Spinal Level of consciousness: awake and alert Pain management: pain level controlled Vital Signs Assessment: post-procedure vital signs reviewed and stable Respiratory status: spontaneous breathing, nonlabored ventilation, respiratory function stable and patient connected to nasal cannula oxygen Cardiovascular status: blood pressure returned to baseline and stable Postop Assessment: no apparent nausea or vomiting Anesthetic complications: no   No notable events documented.   Last Vitals:  Vitals:   02/19/24 0715 02/19/24 1214  BP: 102/60 131/62  Pulse: 67 76  Resp: 19 19  Temp: 37 C 36.7 C  SpO2: 93% 95%    Last Pain:  Vitals:   02/19/24 1214  TempSrc: Oral  PainSc:                  [place] KANDICE Clause      "

## 2024-03-08 ENCOUNTER — Ambulatory Visit
Admission: RE | Admit: 2024-03-08 | Discharge: 2024-03-08 | Disposition: A | Source: Ambulatory Visit | Attending: Oncology

## 2024-03-08 DIAGNOSIS — Z170 Estrogen receptor positive status [ER+]: Secondary | ICD-10-CM

## 2024-03-08 DIAGNOSIS — Z79811 Long term (current) use of aromatase inhibitors: Secondary | ICD-10-CM

## 2024-03-30 ENCOUNTER — Ambulatory Visit

## 2024-03-30 ENCOUNTER — Other Ambulatory Visit

## 2024-03-30 ENCOUNTER — Ambulatory Visit: Admitting: Oncology

## 2024-06-27 ENCOUNTER — Ambulatory Visit: Admitting: Dermatology
